# Patient Record
Sex: Male | Born: 1965 | Race: White | Hispanic: No | Marital: Married | Smoking: Former smoker
Health system: Southern US, Community
[De-identification: ages and names within clinical notes are randomized; demographics above are authoritative.]

## PROBLEM LIST (undated history)

## (undated) DIAGNOSIS — I10 Essential (primary) hypertension: Secondary | ICD-10-CM

## (undated) DIAGNOSIS — E7800 Pure hypercholesterolemia, unspecified: Secondary | ICD-10-CM

## (undated) DIAGNOSIS — T7840XA Allergy, unspecified, initial encounter: Secondary | ICD-10-CM

## (undated) HISTORY — DX: Allergy, unspecified, initial encounter: T78.40XA

---

## 2000-03-01 ENCOUNTER — Inpatient Hospital Stay (HOSPITAL_COMMUNITY): Admission: EM | Admit: 2000-03-01 | Discharge: 2000-03-02 | Payer: Self-pay | Admitting: Emergency Medicine

## 2003-05-31 HISTORY — PX: FRACTURE SURGERY: SHX138

## 2003-08-05 ENCOUNTER — Emergency Department (HOSPITAL_COMMUNITY): Admission: EM | Admit: 2003-08-05 | Discharge: 2003-08-06 | Payer: Self-pay | Admitting: Emergency Medicine

## 2003-08-08 ENCOUNTER — Ambulatory Visit (HOSPITAL_BASED_OUTPATIENT_CLINIC_OR_DEPARTMENT_OTHER): Admission: RE | Admit: 2003-08-08 | Discharge: 2003-08-08 | Payer: Self-pay | Admitting: Orthopedic Surgery

## 2013-06-06 ENCOUNTER — Emergency Department (HOSPITAL_COMMUNITY): Payer: Self-pay

## 2013-06-06 ENCOUNTER — Emergency Department (HOSPITAL_COMMUNITY)
Admission: EM | Admit: 2013-06-06 | Discharge: 2013-06-06 | Disposition: A | Discharge status: Home / Self Care | Payer: Self-pay | Attending: Emergency Medicine | Admitting: Emergency Medicine

## 2013-06-06 ENCOUNTER — Encounter (HOSPITAL_COMMUNITY): Payer: Self-pay | Admitting: Emergency Medicine

## 2013-06-06 DIAGNOSIS — Z880 Allergy status to penicillin: Secondary | ICD-10-CM | POA: Insufficient documentation

## 2013-06-06 DIAGNOSIS — K089 Disorder of teeth and supporting structures, unspecified: Secondary | ICD-10-CM | POA: Insufficient documentation

## 2013-06-06 DIAGNOSIS — R0602 Shortness of breath: Secondary | ICD-10-CM | POA: Insufficient documentation

## 2013-06-06 DIAGNOSIS — F1210 Cannabis abuse, uncomplicated: Secondary | ICD-10-CM | POA: Insufficient documentation

## 2013-06-06 DIAGNOSIS — Z87891 Personal history of nicotine dependence: Secondary | ICD-10-CM | POA: Insufficient documentation

## 2013-06-06 DIAGNOSIS — R0789 Other chest pain: Secondary | ICD-10-CM | POA: Insufficient documentation

## 2013-06-06 LAB — POCT I-STAT TROPONIN I
Troponin i, poc: 0 ng/mL (ref 0.00–0.08)
Troponin i, poc: 0 ng/mL (ref 0.00–0.08)

## 2013-06-06 LAB — CBC
HEMATOCRIT: 44.1 % (ref 39.0–52.0)
Hemoglobin: 15.5 g/dL (ref 13.0–17.0)
MCH: 28.7 pg (ref 26.0–34.0)
MCHC: 35.1 g/dL (ref 30.0–36.0)
MCV: 81.5 fL (ref 78.0–100.0)
Platelets: 224 10*3/uL (ref 150–400)
RBC: 5.41 MIL/uL (ref 4.22–5.81)
RDW: 13.1 % (ref 11.5–15.5)
WBC: 11.1 10*3/uL — AB (ref 4.0–10.5)

## 2013-06-06 LAB — COMPREHENSIVE METABOLIC PANEL
ALK PHOS: 94 U/L (ref 39–117)
ALT: 19 U/L (ref 0–53)
AST: 21 U/L (ref 0–37)
Albumin: 4.3 g/dL (ref 3.5–5.2)
BILIRUBIN TOTAL: 0.3 mg/dL (ref 0.3–1.2)
BUN: 17 mg/dL (ref 6–23)
CO2: 23 mEq/L (ref 19–32)
CREATININE: 0.93 mg/dL (ref 0.50–1.35)
Calcium: 9.4 mg/dL (ref 8.4–10.5)
Chloride: 100 mEq/L (ref 96–112)
Glucose, Bld: 169 mg/dL — ABNORMAL HIGH (ref 70–99)
POTASSIUM: 4 meq/L (ref 3.7–5.3)
SODIUM: 139 meq/L (ref 137–147)
TOTAL PROTEIN: 7.8 g/dL (ref 6.0–8.3)

## 2013-06-06 LAB — D-DIMER, QUANTITATIVE (NOT AT ARMC)

## 2013-06-06 MED ORDER — ASPIRIN 81 MG PO CHEW
CHEWABLE_TABLET | ORAL | Status: AC
  Filled 2013-06-06: qty 4

## 2013-06-06 MED ORDER — ASPIRIN 81 MG PO CHEW
324.0000 mg | CHEWABLE_TABLET | Freq: Once | ORAL | Status: AC
  Administered 2013-06-06: 324 mg via ORAL

## 2013-06-06 NOTE — ED Notes (Signed)
[facility] and he was found to have HTN and then he drove back here after finding out it would take a lot to get teeth fixed and he staretd to have cp and sob

## 2013-06-06 NOTE — ED Notes (Signed)
Dr Criss AlvineGoldston is fine with pt final bp of 151/101

## 2013-06-06 NOTE — Discharge Instructions (Signed)
Chest Pain (Nonspecific) °It is often hard to give a specific diagnosis for the cause of chest pain. There is always a chance that your pain could be related to something serious, such as a heart attack or a blood clot in the lungs. You need to follow up with your caregiver for further evaluation. °CAUSES  °· Heartburn. °· Pneumonia or bronchitis. °· Anxiety or stress. °· Inflammation around your heart (pericarditis) or lung (pleuritis or pleurisy). °· A blood clot in the lung. °· A collapsed lung (pneumothorax). It can develop suddenly on its own (spontaneous pneumothorax) or from injury (trauma) to the chest. °· Shingles infection (herpes zoster virus). °The chest wall is composed of bones, muscles, and cartilage. Any of these can be the source of the pain. °· The bones can be bruised by injury. °· The muscles or cartilage can be strained by coughing or overwork. °· The cartilage can be affected by inflammation and become sore (costochondritis). °DIAGNOSIS  °Lab tests or other studies, such as X-rays, electrocardiography, stress testing, or cardiac imaging, may be needed to find the cause of your pain.  °TREATMENT  °· Treatment depends on what may be causing your chest pain. Treatment may include: °· Acid blockers for heartburn. °· Anti-inflammatory medicine. °· Pain medicine for inflammatory conditions. °· Antibiotics if an infection is present. °· You may be advised to change lifestyle habits. This includes stopping smoking and avoiding alcohol, caffeine, and chocolate. °· You may be advised to keep your head raised (elevated) when sleeping. This reduces the chance of acid going backward from your stomach into your esophagus. °· Most of the time, nonspecific chest pain will improve within 2 to 3 days with rest and mild pain medicine. °HOME CARE INSTRUCTIONS  °· If antibiotics were prescribed, take your antibiotics as directed. Finish them even if you start to feel better. °· For the next few days, avoid physical  activities that bring on chest pain. Continue physical activities as directed. °· Do not smoke. °· Avoid drinking alcohol. °· Only take over-the-counter or prescription medicine for pain, discomfort, or fever as directed by your caregiver. °· Follow your caregiver's suggestions for further testing if your chest pain does not go away. °· Keep any follow-up appointments you made. If you do not go to an appointment, you could develop lasting (chronic) problems with pain. If there is any problem keeping an appointment, you must call to reschedule. °SEEK MEDICAL CARE IF:  °· You think you are having problems from the medicine you are taking. Read your medicine instructions carefully. °· Your chest pain does not go away, even after treatment. °· You develop a rash with blisters on your chest. °SEEK IMMEDIATE MEDICAL CARE IF:  °· You have increased chest pain or pain that spreads to your arm, neck, jaw, back, or abdomen. °· You develop shortness of breath, an increasing cough, or you are coughing up blood. °· You have severe back or abdominal pain, feel nauseous, or vomit. °· You develop severe weakness, fainting, or chills. °· You have a fever. °THIS IS AN EMERGENCY. Do not wait to see if the pain will go away. Get medical help at once. Call your local emergency services (911 in [place] Do not drive yourself to the hospital. °MAKE SURE YOU:  °· Understand these instructions. °· Will watch your condition. °· Will get help right away if you are not doing well or get worse. °Document Released: 02/23/2005 Document Revised: 08/08/2011 Document Reviewed: 12/20/2007 °ExitCare® Patient Information ©2014 [employer],   LLC. ° ° ° °Emergency Department Resource Guide °1) Find a Doctor and Pay Out of Pocket °Although you won't have to find out who is covered by your insurance plan, it is a good idea to ask around and get recommendations. You will then need to call the office and see if the doctor you have chosen will accept you as a new  patient and what types of options they offer for patients who are self-pay. Some doctors offer discounts or will set up payment plans for their patients who do not have insurance, but you will need to ask so you aren't surprised when you get to your appointment. ° °2) Contact Your Local Health Department °Not all health departments have doctors that can see patients for sick visits, but many do, so it is worth a call to see if yours does. If you don't know where your local health department is, you can check in your phone book. The CDC also has a tool to help you locate your state's health department, and many state websites also have listings of all of their local health departments. ° °3) Find a Walk-in Clinic °If your illness is not likely to be very severe or complicated, you may want to try a walk in clinic. These are popping up all over the country in pharmacies, drugstores, and shopping centers. They're usually staffed by nurse practitioners or physician assistants that have been trained to treat common illnesses and complaints. They're usually fairly quick and inexpensive. However, if you have serious medical issues or chronic medical problems, these are probably not your best option. ° °No Primary Care Doctor: °- Call [facility] at  832-8000 - they can help you locate a primary care doctor that  accepts your insurance, provides certain services, etc. °- Physician Referral Service- 1-800-533-3463 ° °Chronic Pain Problems: °Organization         Address  Phone   Notes  ° [facility]  (336) 297-2271 Patients need to be referred by their primary care doctor.  ° °Medication Assistance: °Organization         Address  Phone   Notes  °[place] [facility] [address] °[place], [place] [address] (336) 641-8030 --Must be a resident of [place] °-- Must have NO insurance coverage whatsoever (no Medicaid/ Medicare, etc.) °-- The pt. MUST have a primary  care doctor that directs their care regularly and follows them in the community °  °[facility]  (866) 331-1348   °[employer]  (888) 892-1162   ° °Agencies that provide inexpensive medical care: °Organization         Address  Phone   Notes  °[facility]  (336) 832-8035   °[facility]    (336) 832-7272   °[facility] [address] °[place], [place] [address] (336) 832-4777   °[facility] [address], °[place] (336) 271-4999   °[facility]    (336) 373-0678   °[facility]    (336) 272-1050   °[facility] ° [address], [place] Phone:  (336) 832-4444, Fax:  (336) 832-4440 Hours of Operation:  9 am - 6 pm, M-F.  Also accepts Medicaid/Medicare and self-pay.  °[facility] ° [address], [place] Phone: (336) 832-3150, Fax: (336) 832-3151. Hours of Operation:  8:30 am - 5:30 pm, M-F.  Also accepts Medicaid and self-pay.  °  [facility] [place] [address], [place] Phone: (336) 878-6027   °[facility] [address], [place], [place] (336)723-1848, Ext. 123 Mondays & Thursdays: 7-9 AM.  First 15 patients are seen on a first come, first serve basis. °  ° °Medicaid-accepting [place] Providers: ° °Organization         Address  Phone   Notes  °[facility] [address], [place] (336) 641-2100 Also accepts self-pay patients.  °[facility] [address], [place] ° (336) 856-9996   °[facility] [address], [place] (336) 288-8857   °[facility] [address] [place] [address], [place] (336) 299-7000   °Veita Bland [address], [place]  ° (336) 373-1557 Only accepts [place] Access Medicaid patients after they have their name applied to their card.  ° °Self-Pay (no insurance) in [place]: ° °Organization         Address  Phone   Notes  °Sickle Cell Patients, [facility] [address], [place] (336) 832-1970   °[facility] [address], [place] (336) 832-4400   °[facility] ° [address], [place] (336) 992-4800   °[facility]/Dr. Osei-Bonsu ° [address] [place] [address], [place] or [address], [place] (336) 841-8500 Phone number for both [place] and [place] locations is the same.  °[facility] [address], [place] (336) 299-0000   °[facility] [address] [place] [address], [place] or [address] (336) 852-7530 °(336) 878-2260   °[facility] [address], [place] (336) 350-1642, phone; (336) 294-5005, fax Sees patients 1st and 3rd Saturday of every month.  Must not qualify for public or private insurance (i.e. Medicaid, Medicare, [place] Health Choice, Veterans' Benefits) • Household income should be no more than 200% of the poverty level •The clinic cannot treat you if you are pregnant or think you are pregnant • Sexually transmitted diseases are not treated at the clinic.  ° ° °Dental Care: °Organization         Address  Phone  Notes  °[facility] [address],  (336) 641-6152 Accepts children up to age 21 who are enrolled in Medicaid or [place] Health Choice; pregnant women with a Medicaid card; and children who have applied for Medicaid or [place] Health Choice, but were declined, whose parents can pay a reduced fee at time of service.  °[facility] [place]  [address], [place] (336) 641-7733 Accepts children up to age 21 who are enrolled in Medicaid or [place] Health Choice; pregnant women with a Medicaid card; and children who have applied for Medicaid or [place] Health Choice, but were declined, whose parents can  pay a reduced fee at time of service.  °[facility] ° [address],  (336) 641-4533 Patients are seen by appointment only. Walk-ins are not accepted. [facility] will see patients 18 years of age and older. °Monday - Tuesday (8am-5pm) °Most Wednesdays (8:30-5pm) °$30 per visit, cash only  °[facility] ° [address], [place] (336) 641-4533 Patients are seen by appointment only. Walk-ins are not accepted. [facility] will see patients 18 years of   age and older. °One Wednesday Evening (Monthly: Volunteer Based).  $30 per visit, cash only  °[facility]  (919) 537-3737 for adults; Children under age 4, call Graduate Pediatric Dentistry at (919) [phone]. Children aged 4-14, please call (919) [phone] to request a pediatric application. ° Dental services are provided in all areas of dental care including fillings, crowns and bridges, complete and partial dentures, implants, gum treatment, root canals, and extractions. Preventive care is also provided. Treatment is provided to both adults and children. °Patients are selected via a lottery and there is often a waiting list. °  °[facility] [address], °[place] ° (336) 763-8833 [email or web address] °  °[facility] [address], [place], [place] (336)723-1848, Ext. 123 Second and Fourth Thursday of each month, opens at 6:30 AM; Clinic ends at 9 AM.  Patients are seen on a first-come first-served basis, and a limited number are seen during each clinic.  ° °[facility] ° [address], [place], [place] (336) 723-7904   Eligibility Requirements °You must have lived in [place], [place], or [place] counties for at least the last three months. °  You cannot be eligible for state or federal sponsored healthcare insurance, including Veterans Administration, Medicaid, or Medicare. °  You generally cannot be eligible for healthcare  insurance through your employer.  °  How to apply: °Eligibility screenings are held every Tuesday and Wednesday afternoon from 1:00 pm until 4:00 pm. You do not need an appointment for the interview!  °[facility] [address], [place], [place] 336-631-2330   °[facility]  336-342-8273   °[facility]  336-703-3100   °[facility]  336-570-6415   ° °Behavioral Health Resources in the Community: °Intensive Outpatient Programs °Organization         Address  Phone  Notes  °[facility] [address], [place], [place] 336-878-6098   °[facility] [address], [place], [place] 336-832-9800   °[facility] [address], [place], [place] ° 336-882-2125   °[facility] [address],  °[place], [place] 1-800-853-5163 or 336-641-4981   °Substance Abuse Resources °Organization         Address  Phone  Notes  °[facility]  336-882-2125   °[facility]  336-784-9470   °[facility]  336-285-9073   °[facility]  336-845-3988   °Residential & Outpatient Substance Abuse Program  1-800-659-3381   °Psychological Services °Organization         Address  Phone  Notes  °[facility]  336- 832-9600   °[facility]  336- 378-7881   °[facility] [address], [place] 1-800-853-5163 or 336-641-4981   ° °Mobile Crisis Teams °Organization         Address  Phone  Notes  °[facility], Mobile Crisis Care Unit  1-877-626-1772   °Assertive °[facility] ° [address] [place], [place] 336-834-9664   °Sharon DeEsch [address] ° [place] 336-554-5454   ° °Self-Help/Support Groups °Organization         Address  Phone             Notes  °[facility]  - variety of support groups  336- 373-1402 Call for more information  °Narcotics Anonymous (NA),  [facility] [address], °[place]  2   meetings at this location  ° °Residential Treatment Programs °Organization         Address  Phone  Notes  °[facility] [address],    °[place]  1-866-801-8205   °[facility] ° [address], [place], [place] 704-293-8524   °[facility] [address], [place] 336-845-3988 Admissions: 8am-3pm M-F  °[facility] [address]    °[place], [place] 336-841-1104   °[facility] [address], [place], [place] 336-379-7146   °[facility] [address]  °[place], [place] 336-285-9073   °[facility] Intensive Outpatient [address], [place], [place] 336-852-3033   °[facility] [address]  °[place], [place] 1-877-615-2722 or 336-784-9470   °[facility] ([facility] [address] [place], [place] 336-227-7417 Accepts Medicaid  °[facility] [address]  °[place] 1-800-659-3381 Substance Abuse/Addiction Treatment  ° °[place] Behavioral Health Resources °Organization         Address  Phone  Notes  °[facility]  (888) 581-9988   °Julie Brannon, PhD [address] [place], [place]   (336) 349-5553 or (336) 951-0000   °[facility]   [address] °[place], [place] (336) 349-4454   °[facility] [address], [place], [place] (336) 342-8316 Insurance/Medicaid/sponsorship through [facility]  °[facility] [address]                                    [place], [place] (336) 342-8316 Therapy/tele-psych/case  °[facility] [address]  ° [place], [place] (336) 349-2233    °Dr. Arfeen  (336) 349-4544   °[facility] [place]  [employer] [place] [facility] 1) [address], [place] °2) [address], [place] °3)  [address], [place] (336) 349-3220 °(336) 342-7768 ° °(336) 342-8140    °[place] Child Abuse [facility] (336) 342-1394 or (336) 342-3537 (After Hours)    ° ° ° °

## 2013-06-06 NOTE — ED Provider Notes (Signed)
CSN: [id number]     Arrival date & time 06/06/13  1418 History   First MD Initiated Contact with Patient 06/06/13 1736     Chief Complaint  Patient presents with  . Chest Pain   (Consider location/radiation/quality/duration/timing/severity/associated sxs/prior Treatment) HPI Comments: 48 year old male presents with a chief complaint of chest pain. He states he had some intermittent chest tightness and some upper arm aching like he had been working out. He states this happened one is [facility] the way [facility] after they declined to do dental work on him because it got his blood pressure to be 160 systolic. He states he has not had a history of hypertension. He has no primary care provider at this time. He states the chest pain is gone or numbness of his lobe of aching in his arms. He applies pressure to his arm the pain seems improved. He had transient shortness of breath but denies any shortness but this time. No leg pain or leg swelling. He used to smoke but has not smoked for over 10 months. He states he occasionally does some marijuana but denies any other drugs such as cocaine. States father died of a heart attack around his age but had several other comorbidities. The patient states that his pain was partially 5/10 when it was occurring but he has no pain in his chest now.   History reviewed. No pertinent past medical history. No past surgical history on file. No family history on file. History  Substance Use Topics  . Smoking status: Former Games developermoker  . Smokeless tobacco: Not on file  . Alcohol Use: Yes    Review of Systems  Constitutional: Negative for fever, chills and diaphoresis.  HENT: Positive for dental problem.   Respiratory: Positive for shortness of breath.   Cardiovascular: Positive for chest pain. Negative for palpitations and leg swelling.  Gastrointestinal: Negative for nausea, vomiting and abdominal pain.  Musculoskeletal: Negative for back pain.  Neurological:  Negative for weakness.  All other systems reviewed and are negative.    Allergies  Penicillins  Home Medications   Current Outpatient Rx  Name  Route  Sig  Dispense  Refill  . Acetaminophen (TYLENOL PO)   Oral   Take 2 tablets by mouth daily as needed (headache).         . Aspirin-Salicylamide-Caffeine (BC HEADACHE PO)   Oral   Take 1 packet by mouth daily as needed (headache).          BP 150/105  Pulse 81  Temp(Src) 97.4 F (36.3 C)  Resp 16  SpO2 97% Physical Exam  Nursing note and vitals reviewed. Constitutional: He is oriented to person, place, and time. He appears well-developed and well-nourished. No distress.  HENT:  Head: Normocephalic and atraumatic.  Right Ear: External ear normal.  Left Ear: External ear normal.  Nose: Nose normal.  Eyes: Right eye exhibits no discharge. Left eye exhibits no discharge.  Neck: Neck supple.  Cardiovascular: Normal rate, regular rhythm, normal heart sounds and intact distal pulses.   Pulses:      Radial pulses are 2+ on the right side, and 2+ on the left side.  Pulmonary/Chest: Effort normal and breath sounds normal. He exhibits no tenderness.  Abdominal: Soft. He exhibits no distension. There is no tenderness.  Musculoskeletal: He exhibits no edema and no tenderness.  No tenderness to bilateral upper extremities  Neurological: He is alert and oriented to person, place, and time.  Skin: Skin is  warm and dry.    ED Course  Procedures (including critical care time) Labs Review Labs Reviewed  CBC - Abnormal; Notable for the following:    WBC 11.1 (*)    All other components within normal limits  COMPREHENSIVE METABOLIC PANEL - Abnormal; Notable for the following:    Glucose, Bld 169 (*)    All other components within normal limits  D-DIMER, QUANTITATIVE  POCT I-STAT TROPONIN I  POCT I-STAT TROPONIN I   Imaging Review Dg Chest 2 View  06/06/2013   CLINICAL DATA:  Central chest pain  EXAM: CHEST  2 VIEW   COMPARISON:  None.  FINDINGS: The heart size and mediastinal contours are within normal limits. Both lungs are clear. The visualized skeletal structures are unremarkable.  IMPRESSION: No active cardiopulmonary disease.   Electronically Signed   By: Esperanza Heir M.D.   On: 06/06/2013 15:04    EKG Interpretation    Date/Time:  Thursday June 06 2013 14:23:48 EST Ventricular Rate:  106 PR Interval:  134 QRS Duration: 80 QT Interval:  324 QTC Calculation: 430 R Axis:   43 Text Interpretation:  Sinus tachycardia Right atrial enlargement Borderline ECG No old tracing to compare Confirmed by Tava Peery  MD, Marai Teehan ([id number]) on 06/06/2013 5:37:10 PM            MDM   1. Atypical chest pain    Patient is low risk for ACS given his very atypical story and a HEART score of 2. Patient has 2 negative troponins. He is low risk for PE, but cannot be ruled out due to his tachycardia on arrival. D-dimer was evaluated and is negative. I feel her symptoms are more likely related to the anxiety of being told how high his blood pressure was. I discussed options with the patient, which includes needing followup as an outpatient and establish a primary care. Patient agrees to this and will be given resources. I feel he is stable to be discharged. I discussed return precautions.    Audree Camel, MD 06/06/13 [id number]

## 2015-08-11 ENCOUNTER — Encounter: Payer: Self-pay | Admitting: Family Medicine

## 2015-08-11 DIAGNOSIS — T7840XA Allergy, unspecified, initial encounter: Secondary | ICD-10-CM | POA: Insufficient documentation

## 2015-08-21 ENCOUNTER — Encounter: Payer: Self-pay | Admitting: Family Medicine

## 2015-08-21 ENCOUNTER — Ambulatory Visit (INDEPENDENT_AMBULATORY_CARE_PROVIDER_SITE_OTHER): Payer: BLUE CROSS/BLUE SHIELD | Admitting: Family Medicine

## 2015-08-21 VITALS — BP 130/100 | HR 78 | Temp 97.3°F | Resp 16 | Ht 74.0 in | Wt 180.0 lb

## 2015-08-21 DIAGNOSIS — Z7189 Other specified counseling: Secondary | ICD-10-CM | POA: Diagnosis not present

## 2015-08-21 DIAGNOSIS — Z0000 Encounter for general adult medical examination without abnormal findings: Secondary | ICD-10-CM

## 2015-08-21 DIAGNOSIS — I10 Essential (primary) hypertension: Secondary | ICD-10-CM

## 2015-08-21 DIAGNOSIS — Z23 Encounter for immunization: Secondary | ICD-10-CM | POA: Diagnosis not present

## 2015-08-21 DIAGNOSIS — Z7689 Persons encountering health services in other specified circumstances: Secondary | ICD-10-CM

## 2015-08-21 MED ORDER — LOSARTAN POTASSIUM 50 MG PO TABS: 50.0000 mg | ORAL_TABLET | Freq: Every day | ORAL | Status: DC

## 2015-08-21 NOTE — Addendum Note (Signed)
Addended by: Legrand RamsWILLIS, SANDY B on: 08/21/2015 04:11 PM   Modules accepted: Orders

## 2015-08-21 NOTE — Progress Notes (Signed)
Subjective:    Patient ID: Zachary Cuevas, male    DOB: Aug 06, 1965, 50 y.o.   MRN: [id number]  HPI Patient is a 50 year old white male here today to establish care. He does report a history of elevated blood pressures in the past. His father had a quadruple bypass at age 59 and his maternal grandfather had a heart attack in his 30s. He does not smoke. He does occasionally use marijuana. He denies any chest pain shortness of breath or dyspnea on exertion Past Medical History  Diagnosis Date  . Allergy     hay fever   Past Surgical History  Procedure Laterality Date  . Fracture surgery  2005    boxer fracture  of hands   No current outpatient prescriptions on file prior to visit.   No current facility-administered medications on file prior to visit.   Allergies  Allergen Reactions  . Penicillins Other (See Comments)    Childhood allergy   Social History   Social History  . Marital Status: Married    Spouse Name: N/A  . Number of Children: N/A  . Years of Education: N/A   Occupational History  . Not on file.   Social History Main Topics  . Smoking status: Former Smoker    Quit date: 08/11/2011  . Smokeless tobacco: Never Used  . Alcohol Use: No  . Drug Use: No  . Sexual Activity: Yes   Other Topics Concern  . Not on file   Social History Narrative   Family History  Problem Relation Age of Onset  . Miscarriages / [place] Mother   . Heart disease Maternal Grandfather   . Diabetes Paternal Grandfather   . Heart disease Paternal Grandfather   . Heart disease Father 28  . Diabetes Brother    He is married. He has one biologic child and 3 adopted children   Review of Systems  All other systems reviewed and are negative.      Objective:   Physical Exam  Constitutional: He is oriented to person, place, and time. He appears well-developed and well-nourished. No distress.  HENT:  Head: Normocephalic and atraumatic.  Right Ear: External ear normal.    Left Ear: External ear normal.  Nose: Nose normal.  Mouth/Throat: Oropharynx is clear and moist.  Eyes: Conjunctivae and EOM are normal. Pupils are equal, round, and reactive to light. Right eye exhibits no discharge. Left eye exhibits no discharge. No scleral icterus.  Neck: Normal range of motion. Neck supple. No JVD present. No tracheal deviation present. No thyromegaly present.  Cardiovascular: Normal rate, regular rhythm, normal heart sounds and intact distal pulses.  Exam reveals no gallop and no friction rub.   No murmur heard. Pulmonary/Chest: Effort normal and breath sounds normal. No stridor. No respiratory distress. He has no wheezes. He has no rales. He exhibits no tenderness.  Abdominal: Soft. Bowel sounds are normal. He exhibits no distension and no mass. There is no tenderness. There is no rebound and no guarding.  Musculoskeletal: Normal range of motion. He exhibits no edema or tenderness.  Lymphadenopathy:    He has no cervical adenopathy.  Neurological: He is alert and oriented to person, place, and time. He has normal reflexes. He displays normal reflexes. No cranial nerve deficit. He exhibits normal muscle tone. Coordination normal.  Skin: Skin is warm. No rash noted. He is not diaphoretic. No erythema. No pallor.  Psychiatric: He has a normal mood and affect. His behavior is normal. Judgment and thought content  normal.  Vitals reviewed.         Assessment & Plan:  Benign essential HTN - Plan: losartan (COZAAR) 50 MG tablet  Routine general medical examination at a health care facility  Establishing care with new doctor, encounter for  Begin losartan 50 mg by mouth daily and recheck blood pressure in one month. Return fasting for a CBC, CMP, fasting lipid panel. He is not due for colonoscopy or PSA until age 50. He received his tetanus shot today.

## 2015-10-12 ENCOUNTER — Ambulatory Visit (INDEPENDENT_AMBULATORY_CARE_PROVIDER_SITE_OTHER): Payer: BLUE CROSS/BLUE SHIELD | Admitting: Physician Assistant

## 2015-10-12 ENCOUNTER — Encounter: Payer: Self-pay | Admitting: Physician Assistant

## 2015-10-12 VITALS — BP 152/100 | HR 80 | Temp 98.0°F | Resp 18 | Wt 178.0 lb

## 2015-10-12 DIAGNOSIS — K029 Dental caries, unspecified: Secondary | ICD-10-CM

## 2015-10-12 MED ORDER — CLINDAMYCIN HCL 300 MG PO CAPS: 300.0000 mg | ORAL_CAPSULE | Freq: Three times a day (TID) | ORAL | Status: DC

## 2015-10-12 MED ORDER — HYDROCODONE-ACETAMINOPHEN 5-325 MG PO TABS: 1.0000 | ORAL_TABLET | Freq: Four times a day (QID) | ORAL | Status: DC | PRN

## 2015-10-12 NOTE — Progress Notes (Signed)
Patient ID: Zachary Cuevas MRN: [id number], DOB: 04-12-1966, 50 y.o. Date of Encounter: 10/12/2015, 4:38 PM    Chief Complaint:  Chief Complaint  Patient presents with  . c/o tooth ache now whole side of face hurts     HPI: 50 y.o. year old white male presents with above. Says he has false teeth for upper left. Says this is a bad tooth on left bottom causing current pain. Says he knows he needs to have teeth pulled but doesn't have the money. Says he will have some money coming in at the end of the month and hopes he can start getting teeth taken care of then.  Discussed using Tylenol 3 during day so that not on strong med during work. He says they "are down a car right now" so wife drives him to work and picks him up. Says he is not working with any equipment, not on any ladders etc. Says he knows otc pain meds "not touching it any more. " Says entire left lower jaw hurting.     Home Meds:   Outpatient Prescriptions Prior to Visit  Medication Sig Dispense Refill  . losartan (COZAAR) 50 MG tablet Take 1 tablet (50 mg total) by mouth daily. 90 tablet 3   No facility-administered medications prior to visit.    Allergies:  Allergies  Allergen Reactions  . Penicillins Other (See Comments)    Childhood allergy      Review of Systems: See HPI for pertinent ROS. All other ROS negative.    Physical Exam: Blood pressure 152/100, pulse 80, temperature 98 F (36.7 C), temperature source Oral, resp. rate 18, weight 178 lb (80.74 kg)., Body mass index is 22.84 kg/(m^2). General:  WNWD WM. Appears in no acute distress. HEENT: Normocephalic, atraumatic. Bilateral auditory canals clear, TM's are without perforation, pearly grey and translucent with reflective cone of light bilaterally. Oral cavity moist. Left lower teeth---all with severe dental caries. All teeth with black holes and brown teeth.   Neck: Supple. No thyromegaly. 50 reports tenderness with palpation of left tonsillar node.  No enlargement with palpation.  Lungs: Clear bilaterally to auscultation without wheezes, rales, or rhonchi. Breathing is unlabored. Heart: Regular rhythm. No murmurs, rubs, or gallops. Msk:  Strength and tone normal for age. Extremities/Skin: Warm and dry. Neuro: Alert and oriented X 3. Moves all extremities spontaneously. Gait is normal. CNII-XII grossly in tact. Psych:  Responds to questions appropriately with a normal affect.     ASSESSMENT AND PLAN:  50 y.o. year old male with  1. 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 He is to take Clindamycin as directed. Use pain med as directed, as sparingly as possible. Discussed that this is just a "band-aid" and is not fixing underlying problem. He voices understanding and agrees to schedule f/u as soon as he has some money.   Says he will have some money coming in at the end of the month and hopes he can start getting teeth taken care of then.  Discussed using Tylenol 3 during day so that not on strong med during work. He says they "are down a car right now" so wife drives him to work and picks him up. Says he is not working with any equipment, not on any ladders etc. Says he knows otc pain meds "not touching it any more. " Says entire left lower jaw hurting. I reviewed his chart. See no indication of narcotic abuse in past.   - clindamycin (CLEOCIN) 300 MG capsule; Take  1 capsule (300 mg total) by mouth 3 (three) times daily.  Dispense: 30 capsule; Refill: 0 - HYDROcodone-acetaminophen (NORCO/VICODIN) 5-325 MG tablet; Take 1 tablet by mouth every 6 (six) hours as needed.  Dispense: 45 tablet; Refill: 0   Signed, [address] Phil[address] StreetMary Beth Blue RidgeDixon, [place]PA, [facility] 10/12/2015 4:38 PM

## 2015-11-20 ENCOUNTER — Other Ambulatory Visit: Payer: BLUE CROSS/BLUE SHIELD

## 2015-11-20 DIAGNOSIS — I10 Essential (primary) hypertension: Secondary | ICD-10-CM | POA: Diagnosis not present

## 2015-11-20 LAB — LIPID PANEL
CHOL/HDL RATIO: 7.3 ratio — AB (ref <=5.0)
CHOLESTEROL: 218 mg/dL — AB (ref 125–200)
HDL: 30 mg/dL — ABNORMAL LOW (ref >=40)
LDL CALC: 131 mg/dL — AB (ref <130)
Triglycerides: 287 mg/dL — ABNORMAL HIGH (ref <150)
VLDL: 57 mg/dL — AB (ref <30)

## 2015-11-20 LAB — COMPLETE METABOLIC PANEL WITH GFR
ALT: 18 U/L (ref 9–46)
AST: 21 U/L (ref 10–40)
Albumin: 4.2 g/dL (ref 3.6–5.1)
Alkaline Phosphatase: 81 U/L (ref 40–115)
BUN: 11 mg/dL (ref 7–25)
CHLORIDE: 106 mmol/L (ref 98–110)
CO2: 24 mmol/L (ref 20–31)
CREATININE: 0.92 mg/dL (ref 0.60–1.35)
Calcium: 9.1 mg/dL (ref 8.6–10.3)
GFR, Est African American: >89 mL/min (ref >=60)
GFR, Est Non African American: >89 mL/min (ref >=60)
GLUCOSE: 105 mg/dL — AB (ref 70–99)
POTASSIUM: 4.7 mmol/L (ref 3.5–5.3)
SODIUM: 140 mmol/L (ref 135–146)
Total Bilirubin: 0.4 mg/dL (ref 0.2–1.2)
Total Protein: 6.7 g/dL (ref 6.1–8.1)

## 2015-11-20 LAB — CBC WITH DIFFERENTIAL/PLATELET
BASOS PCT: 1 %
Basophils Absolute: 92 cells/uL (ref 0–200)
EOS PCT: 6 %
Eosinophils Absolute: 552 cells/uL — ABNORMAL HIGH (ref 15–500)
HEMATOCRIT: 43.9 % (ref 38.5–50.0)
Hemoglobin: 14.6 g/dL (ref 13.0–17.0)
LYMPHS PCT: 33 %
Lymphs Abs: 3036 cells/uL (ref 850–3900)
MCH: 27.3 pg (ref 27.0–33.0)
MCHC: 33.3 g/dL (ref 32.0–36.0)
MCV: 82.2 fL (ref 80.0–100.0)
MONO ABS: 368 {cells}/uL (ref 200–950)
MONOS PCT: 4 %
MPV: 10.6 fL (ref 7.5–12.5)
NEUTROS PCT: 56 %
Neutro Abs: 5152 cells/uL (ref 1500–7800)
PLATELETS: 217 10*3/uL (ref 140–400)
RBC: 5.34 MIL/uL (ref 4.20–5.80)
RDW: 14.7 % (ref 11.0–15.0)
WBC: 9.2 10*3/uL (ref 3.8–10.8)

## 2015-11-24 ENCOUNTER — Other Ambulatory Visit: Payer: Self-pay | Admitting: Family Medicine

## 2015-11-24 DIAGNOSIS — E785 Hyperlipidemia, unspecified: Secondary | ICD-10-CM

## 2015-11-24 DIAGNOSIS — Z79899 Other long term (current) drug therapy: Secondary | ICD-10-CM

## 2015-11-24 DIAGNOSIS — I10 Essential (primary) hypertension: Secondary | ICD-10-CM

## 2015-11-24 MED ORDER — ATORVASTATIN CALCIUM 20 MG PO TABS: 20.0000 mg | ORAL_TABLET | Freq: Every day | ORAL | Status: DC

## 2015-12-22 ENCOUNTER — Other Ambulatory Visit: Payer: Self-pay | Admitting: Family Medicine

## 2015-12-22 MED ORDER — ATORVASTATIN CALCIUM 20 MG PO TABS: 20.0000 mg | ORAL_TABLET | Freq: Every day | ORAL | 3 refills | Status: DC

## 2016-03-25 ENCOUNTER — Ambulatory Visit (INDEPENDENT_AMBULATORY_CARE_PROVIDER_SITE_OTHER): Payer: BLUE CROSS/BLUE SHIELD | Admitting: Family Medicine

## 2016-03-25 ENCOUNTER — Encounter: Payer: Self-pay | Admitting: Family Medicine

## 2016-03-25 VITALS — BP 148/100 | HR 80 | Temp 97.4°F | Wt 177.0 lb

## 2016-03-25 DIAGNOSIS — R1011 Right upper quadrant pain: Secondary | ICD-10-CM | POA: Diagnosis not present

## 2016-03-25 MED ORDER — LOSARTAN POTASSIUM-HCTZ 100-12.5 MG PO TABS: 1.0000 | ORAL_TABLET | Freq: Every day | ORAL | 3 refills | Status: DC

## 2016-03-25 NOTE — Progress Notes (Signed)
Subjective:    Patient ID: Zachary Cuevas, male    DOB: 12-01-1965, 50 y.o.   MRN: [id number]  HPI  08/21/15 Patient is a 50 year old white male here today to establish care. He does report a history of elevated blood pressures in the past. His father had a quadruple bypass at age 49 and his maternal grandfather had a heart attack in his 30s. He does not smoke. He does occasionally use marijuana. He denies any chest pain shortness of breath or dyspnea on exertion.  At that time, my plan was: Begin losartan 50 mg by mouth daily and recheck blood pressure in one month. Return fasting for a CBC, CMP, fasting lipid panel. He is not due for colonoscopy or PSA until age 50. He received his tetanus shot today.  03/25/16 The patient states that roughly around the same time he started the cholesterol medication he developed a vague pain in his epigastric area/right upper quadrant. He states that it does not occur every day. It is not sharp. It is difficult to describe however is similar to a dull cramp. It can last for a long period of time. He denies any blood in his stool. He denies any black tarry stools. He denies any fevers or chills. He denies any nausea vomiting or diarrhea. He denies any constipation. He does not drink. There is no obvious jaundice or scleral icterus. There is no tenderness to palpation in the right upper quadrant. There is no guarding or rebound. He denies any exacerbation with food. There are no exacerbating or relieving factors. His blood pressure today is still elevated even on the losartan Past Medical History:  Diagnosis Date  . Allergy    hay fever   Past Surgical History:  Procedure Laterality Date  . FRACTURE SURGERY  2005   boxer fracture  of hands   Current Outpatient Prescriptions on File Prior to Visit  Medication Sig Dispense Refill  . atorvastatin (LIPITOR) 20 MG tablet Take 1 tablet (20 mg total) by mouth daily. 90 tablet 3  . clindamycin (CLEOCIN) 300 MG  capsule Take 1 capsule (300 mg total) by mouth 3 (three) times daily. 30 capsule 0  . HYDROcodone-acetaminophen (NORCO/VICODIN) 5-325 MG tablet Take 1 tablet by mouth every 6 (six) hours as needed. 45 tablet 0  . losartan (COZAAR) 50 MG tablet Take 1 tablet (50 mg total) by mouth daily. 90 tablet 3   No current facility-administered medications on file prior to visit.    Allergies  Allergen Reactions  . Penicillins Other (See Comments)    Childhood allergy   Social History   Social History  . Marital status: Married    Spouse name: N/A  . Number of children: N/A  . Years of education: N/A   Occupational History  . Not on file.   Social History Main Topics  . Smoking status: Former Smoker    Quit date: 08/11/2011  . Smokeless tobacco: Never Used  . Alcohol use No  . Drug use: No  . Sexual activity: Yes   Other Topics Concern  . Not on file   Social History Narrative  . No narrative on file   Family History  Problem Relation Age of Onset  . Miscarriages / [place] Mother   . Heart disease Maternal Grandfather   . Diabetes Paternal Grandfather   . Heart disease Paternal Grandfather   . Heart disease Father [phone]  . Diabetes Brother    He is married. He has one biologic child  and 3 adopted children   Review of Systems  All other systems reviewed and are negative.      Objective:   Physical Exam  Constitutional: He is oriented to person, place, and time. He appears well-developed and well-nourished. No distress.  HENT:  Head: Normocephalic and atraumatic.  Right Ear: External ear normal.  Left Ear: External ear normal.  Nose: Nose normal.  Mouth/Throat: Oropharynx is clear and moist.  Eyes: Conjunctivae and EOM are normal. Pupils are equal, round, and reactive to light. Right eye exhibits no discharge. Left eye exhibits no discharge. No scleral icterus.  Neck: Normal range of motion. Neck supple. No JVD present. No tracheal deviation present. No thyromegaly  present.  Cardiovascular: Normal rate, regular rhythm, normal heart sounds and intact distal pulses.  Exam reveals no gallop and no friction rub.   No murmur heard. Pulmonary/Chest: Effort normal and breath sounds normal. No stridor. No respiratory distress. He has no wheezes. He has no rales. He exhibits no tenderness.  Abdominal: Soft. Bowel sounds are normal. He exhibits no distension and no mass. There is no tenderness. There is no rebound and no guarding.  Musculoskeletal: Normal range of motion. He exhibits no edema or tenderness.  Lymphadenopathy:    He has no cervical adenopathy.  Neurological: He is alert and oriented to person, place, and time. He has normal reflexes. No cranial nerve deficit. He exhibits normal muscle tone. Coordination normal.  Skin: Skin is warm. No rash noted. He is not diaphoretic. No erythema. No pallor.  Psychiatric: He has a normal mood and affect. His behavior is normal. Judgment and thought content normal.  Vitals reviewed.         Assessment & Plan:  RUQ abdominal pain - Plan: CBC with Differential/Platelet, COMPLETE METABOLIC PANEL WITH GFR, Lipase  Blood pressure still elevated. I will discontinue losartan and switch the patient to Hyzaar 100/12.5 one by mouth daily. Meanwhile I will check a CBC, CMP, and a lipase. I will have the patient temporarily discontinue Lipitor and also begin Nexium roughly 40 mg a day and recheck in one week. If pain persists, proceed with imaging of the abdomen and pelvis.  Suspect this may be dyspepsia or possibly an early duodenal ulcer

## 2016-03-26 LAB — CBC WITH DIFFERENTIAL/PLATELET
BASOS ABS: 93 {cells}/uL (ref 0–200)
BASOS PCT: 1 %
EOS ABS: 465 {cells}/uL (ref 15–500)
Eosinophils Relative: 5 %
HEMATOCRIT: 41.4 % (ref 38.5–50.0)
Hemoglobin: 14 g/dL (ref 13.0–17.0)
Lymphocytes Relative: 33 %
Lymphs Abs: 3069 cells/uL (ref 850–3900)
MCH: 28 pg (ref 27.0–33.0)
MCHC: 33.8 g/dL (ref 32.0–36.0)
MCV: 82.8 fL (ref 80.0–100.0)
MONO ABS: 465 {cells}/uL (ref 200–950)
MONOS PCT: 5 %
MPV: 10.8 fL (ref 7.5–12.5)
NEUTROS ABS: 5208 {cells}/uL (ref 1500–7800)
Neutrophils Relative %: 56 %
Platelets: 224 10*3/uL (ref 140–400)
RBC: 5 MIL/uL (ref 4.20–5.80)
RDW: 14.1 % (ref 11.0–15.0)
WBC: 9.3 10*3/uL (ref 3.8–10.8)

## 2016-03-26 LAB — COMPLETE METABOLIC PANEL WITH GFR
ALBUMIN: 4.4 g/dL (ref 3.6–5.1)
ALT: 18 U/L (ref 9–46)
AST: 22 U/L (ref 10–40)
Alkaline Phosphatase: 78 U/L (ref 40–115)
BILIRUBIN TOTAL: 0.4 mg/dL (ref 0.2–1.2)
BUN: 16 mg/dL (ref 7–25)
CALCIUM: 9.5 mg/dL (ref 8.6–10.3)
CHLORIDE: 107 mmol/L (ref 98–110)
CO2: 26 mmol/L (ref 20–31)
CREATININE: 0.99 mg/dL (ref 0.60–1.35)
GFR, Est Non African American: >89 mL/min (ref >=60)
Glucose, Bld: 103 mg/dL — ABNORMAL HIGH (ref 70–99)
Potassium: 4.2 mmol/L (ref 3.5–5.3)
Sodium: 141 mmol/L (ref 135–146)
TOTAL PROTEIN: 6.9 g/dL (ref 6.1–8.1)

## 2016-03-26 LAB — LIPASE: LIPASE: 95 U/L — AB (ref 7–60)

## 2016-04-11 ENCOUNTER — Other Ambulatory Visit: Payer: Self-pay | Admitting: Family Medicine

## 2016-04-11 MED ORDER — ESOMEPRAZOLE MAGNESIUM 40 MG PO CPDR: 40.0000 mg | DELAYED_RELEASE_CAPSULE | Freq: Every day | ORAL | 5 refills | Status: DC

## 2016-07-13 ENCOUNTER — Encounter (HOSPITAL_COMMUNITY)
Admission: EM | Disposition: A | Discharge status: Home / Self Care | Payer: Self-pay | Source: Home / Self Care | Attending: Thoracic Surgery (Cardiothoracic Vascular Surgery)

## 2016-07-13 ENCOUNTER — Inpatient Hospital Stay (HOSPITAL_COMMUNITY): Payer: BLUE CROSS/BLUE SHIELD

## 2016-07-13 ENCOUNTER — Inpatient Hospital Stay (HOSPITAL_COMMUNITY)
Admission: EM | Admit: 2016-07-13 | Discharge: 2016-07-18 | DRG: 234 | Disposition: A | Discharge status: Home / Self Care | Payer: BLUE CROSS/BLUE SHIELD | Attending: Thoracic Surgery (Cardiothoracic Vascular Surgery) | Admitting: Thoracic Surgery (Cardiothoracic Vascular Surgery)

## 2016-07-13 ENCOUNTER — Encounter (HOSPITAL_COMMUNITY): Payer: Self-pay | Admitting: Emergency Medicine

## 2016-07-13 ENCOUNTER — Emergency Department (HOSPITAL_COMMUNITY): Payer: BLUE CROSS/BLUE SHIELD

## 2016-07-13 DIAGNOSIS — Z79899 Other long term (current) drug therapy: Secondary | ICD-10-CM | POA: Diagnosis not present

## 2016-07-13 DIAGNOSIS — E7800 Pure hypercholesterolemia, unspecified: Secondary | ICD-10-CM | POA: Diagnosis not present

## 2016-07-13 DIAGNOSIS — I214 Non-ST elevation (NSTEMI) myocardial infarction: Principal | ICD-10-CM | POA: Diagnosis present

## 2016-07-13 DIAGNOSIS — Z01810 Encounter for preprocedural cardiovascular examination: Secondary | ICD-10-CM | POA: Diagnosis not present

## 2016-07-13 DIAGNOSIS — F1290 Cannabis use, unspecified, uncomplicated: Secondary | ICD-10-CM | POA: Diagnosis present

## 2016-07-13 DIAGNOSIS — R000 Tachycardia, unspecified: Secondary | ICD-10-CM | POA: Diagnosis not present

## 2016-07-13 DIAGNOSIS — E785 Hyperlipidemia, unspecified: Secondary | ICD-10-CM | POA: Diagnosis present

## 2016-07-13 DIAGNOSIS — I10 Essential (primary) hypertension: Secondary | ICD-10-CM | POA: Diagnosis not present

## 2016-07-13 DIAGNOSIS — K219 Gastro-esophageal reflux disease without esophagitis: Secondary | ICD-10-CM | POA: Diagnosis present

## 2016-07-13 DIAGNOSIS — I2510 Atherosclerotic heart disease of native coronary artery without angina pectoris: Secondary | ICD-10-CM | POA: Diagnosis present

## 2016-07-13 DIAGNOSIS — I25110 Atherosclerotic heart disease of native coronary artery with unstable angina pectoris: Secondary | ICD-10-CM | POA: Diagnosis not present

## 2016-07-13 DIAGNOSIS — Z951 Presence of aortocoronary bypass graft: Secondary | ICD-10-CM

## 2016-07-13 DIAGNOSIS — I081 Rheumatic disorders of both mitral and tricuspid valves: Secondary | ICD-10-CM | POA: Diagnosis not present

## 2016-07-13 DIAGNOSIS — I252 Old myocardial infarction: Secondary | ICD-10-CM

## 2016-07-13 DIAGNOSIS — E8770 Fluid overload, unspecified: Secondary | ICD-10-CM | POA: Diagnosis not present

## 2016-07-13 DIAGNOSIS — R079 Chest pain, unspecified: Secondary | ICD-10-CM

## 2016-07-13 DIAGNOSIS — Z8249 Family history of ischemic heart disease and other diseases of the circulatory system: Secondary | ICD-10-CM

## 2016-07-13 DIAGNOSIS — J9811 Atelectasis: Secondary | ICD-10-CM | POA: Diagnosis not present

## 2016-07-13 DIAGNOSIS — Z87891 Personal history of nicotine dependence: Secondary | ICD-10-CM | POA: Diagnosis not present

## 2016-07-13 HISTORY — PX: LEFT HEART CATH AND CORONARY ANGIOGRAPHY: CATH118249

## 2016-07-13 HISTORY — DX: Pure hypercholesterolemia, unspecified: E78.00

## 2016-07-13 HISTORY — DX: Essential (primary) hypertension: I10

## 2016-07-13 HISTORY — DX: Non-ST elevation (NSTEMI) myocardial infarction: I21.4

## 2016-07-13 HISTORY — PX: CORONARY BALLOON ANGIOPLASTY: CATH118233

## 2016-07-13 LAB — CBC
HCT: 44 % (ref 39.0–52.0)
Hemoglobin: 14.8 g/dL (ref 13.0–17.0)
MCH: 28 pg (ref 26.0–34.0)
MCHC: 33.6 g/dL (ref 30.0–36.0)
MCV: 83.2 fL (ref 78.0–100.0)
PLATELETS: 227 10*3/uL (ref 150–400)
RBC: 5.29 MIL/uL (ref 4.22–5.81)
RDW: 13.2 % (ref 11.5–15.5)
WBC: 11.9 10*3/uL — AB (ref 4.0–10.5)

## 2016-07-13 LAB — MRSA PCR SCREENING: MRSA by PCR: NEGATIVE

## 2016-07-13 LAB — I-STAT TROPONIN, ED
TROPONIN I, POC: 0.02 ng/mL (ref 0.00–0.08)
TROPONIN I, POC: 2.04 ng/mL — AB (ref 0.00–0.08)

## 2016-07-13 LAB — ABO/RH: ABO/RH(D): O NEG

## 2016-07-13 LAB — BASIC METABOLIC PANEL
Anion gap: 11 (ref 5–15)
BUN: 20 mg/dL (ref 6–20)
CALCIUM: 10 mg/dL (ref 8.9–10.3)
CO2: 29 mmol/L (ref 22–32)
CREATININE: 1.16 mg/dL (ref 0.61–1.24)
Chloride: 101 mmol/L (ref 101–111)
GFR calc non Af Amer: >60 mL/min (ref >60)
Glucose, Bld: 173 mg/dL — ABNORMAL HIGH (ref 65–99)
Potassium: 3.6 mmol/L (ref 3.5–5.1)
SODIUM: 141 mmol/L (ref 135–145)

## 2016-07-13 LAB — POCT ACTIVATED CLOTTING TIME: ACTIVATED CLOTTING TIME: 268 s

## 2016-07-13 LAB — PROTIME-INR
INR: 0.96
PROTHROMBIN TIME: 12.8 s (ref 11.4–15.2)

## 2016-07-13 LAB — POCT I-STAT 3, ART BLOOD GAS (G3+)
Bicarbonate: 25.1 mmol/L (ref 20.0–28.0)
O2 SAT: 97 %
PCO2 ART: 40.4 mmHg (ref 32.0–48.0)
TCO2: 26 mmol/L (ref 0–100)
pH, Arterial: 7.4 (ref 7.350–7.450)
pO2, Arterial: 89 mmHg (ref 83.0–108.0)

## 2016-07-13 LAB — TYPE AND SCREEN
ABO/RH(D): O NEG
Antibody Screen: NEGATIVE

## 2016-07-13 LAB — TROPONIN I
TROPONIN I: 11.68 ng/mL — AB (ref <0.03)
Troponin I: 3.05 ng/mL (ref <0.03)

## 2016-07-13 SURGERY — LEFT HEART CATH AND CORONARY ANGIOGRAPHY
Anesthesia: LOCAL

## 2016-07-13 MED ORDER — LOSARTAN POTASSIUM-HCTZ 100-12.5 MG PO TABS: 1.0000 | ORAL_TABLET | Freq: Every day | ORAL | Status: DC

## 2016-07-13 MED ORDER — MIDAZOLAM HCL 2 MG/2ML IJ SOLN
INTRAMUSCULAR | Status: DC | PRN
  Administered 2016-07-13: 1 mg via INTRAVENOUS

## 2016-07-13 MED ORDER — DIAZEPAM 5 MG PO TABS
5.0000 mg | ORAL_TABLET | Freq: Once | ORAL | Status: AC
  Administered 2016-07-14: 5 mg via ORAL
  Filled 2016-07-13: qty 1

## 2016-07-13 MED ORDER — TRANEXAMIC ACID 1000 MG/10ML IV SOLN
1.5000 mg/kg/h | INTRAVENOUS | Status: DC
  Filled 2016-07-13: qty 25

## 2016-07-13 MED ORDER — SODIUM CHLORIDE 0.9 % IV SOLN: 250.0000 mL | INTRAVENOUS | Status: DC | PRN

## 2016-07-13 MED ORDER — HEPARIN BOLUS VIA INFUSION
4000.0000 [IU] | Freq: Once | INTRAVENOUS | Status: AC
  Administered 2016-07-13: 4000 [IU] via INTRAVENOUS
  Filled 2016-07-13: qty 4000

## 2016-07-13 MED ORDER — SODIUM CHLORIDE 0.9% FLUSH: 3.0000 mL | Freq: Two times a day (BID) | INTRAVENOUS | Status: DC

## 2016-07-13 MED ORDER — HEPARIN (PORCINE) IN NACL 100-0.45 UNIT/ML-% IJ SOLN
1200.0000 [IU]/h | INTRAMUSCULAR | Status: DC
  Administered 2016-07-13: 1200 [IU]/h via INTRAVENOUS
  Filled 2016-07-13: qty 250

## 2016-07-13 MED ORDER — ONDANSETRON HCL 4 MG/2ML IJ SOLN: 4.0000 mg | Freq: Four times a day (QID) | INTRAMUSCULAR | Status: DC | PRN

## 2016-07-13 MED ORDER — SODIUM CHLORIDE 0.9 % WEIGHT BASED INFUSION: 1.0000 mL/kg/h | INTRAVENOUS | Status: DC

## 2016-07-13 MED ORDER — PANTOPRAZOLE SODIUM 40 MG PO TBEC
40.0000 mg | DELAYED_RELEASE_TABLET | Freq: Every day | ORAL | Status: DC
Start: 2016-07-13 — End: 2016-07-14
  Administered 2016-07-13: 40 mg via ORAL
  Filled 2016-07-13: qty 1

## 2016-07-13 MED ORDER — SODIUM CHLORIDE 0.9 % WEIGHT BASED INFUSION
3.0000 mL/kg/h | INTRAVENOUS | Status: DC
  Administered 2016-07-13: 3 mL/kg/h via INTRAVENOUS

## 2016-07-13 MED ORDER — PHENYLEPHRINE HCL 10 MG/ML IJ SOLN
30.0000 ug/min | INTRAMUSCULAR | Status: DC
  Filled 2016-07-13: qty 2

## 2016-07-13 MED ORDER — HEPARIN SODIUM (PORCINE) 1000 UNIT/ML IJ SOLN
INTRAMUSCULAR | Status: AC
  Filled 2016-07-13: qty 1

## 2016-07-13 MED ORDER — CHLORHEXIDINE GLUCONATE 0.12 % MT SOLN
15.0000 mL | Freq: Once | OROMUCOSAL | Status: AC
  Administered 2016-07-14: 15 mL via OROMUCOSAL
  Filled 2016-07-13: qty 15

## 2016-07-13 MED ORDER — LOSARTAN POTASSIUM 50 MG PO TABS
100.0000 mg | ORAL_TABLET | Freq: Every day | ORAL | Status: DC
  Administered 2016-07-13: 100 mg via ORAL
  Filled 2016-07-13: qty 2

## 2016-07-13 MED ORDER — ACETAMINOPHEN 325 MG PO TABS
650.0000 mg | ORAL_TABLET | ORAL | Status: DC | PRN
  Administered 2016-07-13: 650 mg via ORAL
  Filled 2016-07-13: qty 2

## 2016-07-13 MED ORDER — ASPIRIN EC 81 MG PO TBEC: 81.0000 mg | DELAYED_RELEASE_TABLET | Freq: Every day | ORAL | Status: DC

## 2016-07-13 MED ORDER — SODIUM CHLORIDE 0.9 % IV SOLN
INTRAVENOUS | Status: DC
  Filled 2016-07-13: qty 30

## 2016-07-13 MED ORDER — ALPRAZOLAM 0.25 MG PO TABS: 0.2500 mg | ORAL_TABLET | ORAL | Status: DC | PRN

## 2016-07-13 MED ORDER — IOPAMIDOL (ISOVUE-370) INJECTION 76%
INTRAVENOUS | Status: AC
  Filled 2016-07-13: qty 50

## 2016-07-13 MED ORDER — HYDROCHLOROTHIAZIDE 12.5 MG PO CAPS
12.5000 mg | ORAL_CAPSULE | Freq: Every day | ORAL | Status: DC
  Administered 2016-07-13: 12.5 mg via ORAL
  Filled 2016-07-13: qty 1

## 2016-07-13 MED ORDER — ASPIRIN 81 MG PO CHEW: 81.0000 mg | CHEWABLE_TABLET | ORAL | Status: DC

## 2016-07-13 MED ORDER — BISACODYL 5 MG PO TBEC
5.0000 mg | DELAYED_RELEASE_TABLET | Freq: Once | ORAL | Status: AC
  Administered 2016-07-13: 5 mg via ORAL
  Filled 2016-07-13: qty 1

## 2016-07-13 MED ORDER — ATORVASTATIN CALCIUM 80 MG PO TABS
80.0000 mg | ORAL_TABLET | Freq: Every day | ORAL | Status: DC
  Administered 2016-07-13: 80 mg via ORAL
  Filled 2016-07-13: qty 1

## 2016-07-13 MED ORDER — POTASSIUM CHLORIDE 2 MEQ/ML IV SOLN
80.0000 meq | INTRAVENOUS | Status: DC
  Filled 2016-07-13: qty 40

## 2016-07-13 MED ORDER — LEVOFLOXACIN IN D5W 500 MG/100ML IV SOLN
500.0000 mg | INTRAVENOUS | Status: DC
  Filled 2016-07-13: qty 100

## 2016-07-13 MED ORDER — ATORVASTATIN CALCIUM 80 MG PO TABS: 80.0000 mg | ORAL_TABLET | Freq: Every day | ORAL | Status: DC

## 2016-07-13 MED ORDER — NITROGLYCERIN IN D5W 200-5 MCG/ML-% IV SOLN
2.0000 ug/min | INTRAVENOUS | Status: DC
  Filled 2016-07-13: qty 250

## 2016-07-13 MED ORDER — SODIUM CHLORIDE 0.9% FLUSH: 3.0000 mL | INTRAVENOUS | Status: DC | PRN

## 2016-07-13 MED ORDER — ATORVASTATIN CALCIUM 80 MG PO TABS: 80.0000 mg | ORAL_TABLET | ORAL | Status: DC

## 2016-07-13 MED ORDER — TIROFIBAN HCL IN NACL 5-0.9 MG/100ML-% IV SOLN
INTRAVENOUS | Status: AC
  Filled 2016-07-13: qty 100

## 2016-07-13 MED ORDER — NITROGLYCERIN 0.4 MG SL SUBL
0.4000 mg | SUBLINGUAL_TABLET | SUBLINGUAL | Status: DC | PRN
  Administered 2016-07-13: 0.4 mg via SUBLINGUAL
  Filled 2016-07-13: qty 1

## 2016-07-13 MED ORDER — MORPHINE SULFATE (PF) 4 MG/ML IV SOLN
4.0000 mg | Freq: Once | INTRAVENOUS | Status: AC
  Administered 2016-07-13: 4 mg via INTRAVENOUS
  Filled 2016-07-13: qty 1

## 2016-07-13 MED ORDER — MIDAZOLAM HCL 2 MG/2ML IJ SOLN
INTRAMUSCULAR | Status: AC
  Filled 2016-07-13: qty 2

## 2016-07-13 MED ORDER — EPINEPHRINE PF 1 MG/ML IJ SOLN
0.0000 ug/min | INTRAVENOUS | Status: DC
  Filled 2016-07-13: qty 4

## 2016-07-13 MED ORDER — LIDOCAINE HCL (PF) 1 % IJ SOLN
INTRAMUSCULAR | Status: DC | PRN
  Administered 2016-07-13: 3 mL

## 2016-07-13 MED ORDER — DOPAMINE-DEXTROSE 3.2-5 MG/ML-% IV SOLN
0.0000 ug/kg/min | INTRAVENOUS | Status: DC
  Filled 2016-07-13: qty 250

## 2016-07-13 MED ORDER — HEPARIN SODIUM (PORCINE) 1000 UNIT/ML IJ SOLN
INTRAMUSCULAR | Status: DC | PRN
  Administered 2016-07-13: 4500 [IU] via INTRAVENOUS
  Administered 2016-07-13: 4000 [IU] via INTRAVENOUS

## 2016-07-13 MED ORDER — VERAPAMIL HCL 2.5 MG/ML IV SOLN
INTRAVENOUS | Status: AC
  Filled 2016-07-13: qty 2

## 2016-07-13 MED ORDER — FENTANYL CITRATE (PF) 100 MCG/2ML IJ SOLN
INTRAMUSCULAR | Status: DC | PRN
  Administered 2016-07-13: 50 ug via INTRAVENOUS

## 2016-07-13 MED ORDER — VANCOMYCIN HCL 10 G IV SOLR
1250.0000 mg | INTRAVENOUS | Status: DC
  Filled 2016-07-13: qty 1250

## 2016-07-13 MED ORDER — INSULIN REGULAR HUMAN 100 UNIT/ML IJ SOLN
INTRAMUSCULAR | Status: DC
  Filled 2016-07-13: qty 2.5

## 2016-07-13 MED ORDER — HEPARIN (PORCINE) IN NACL 2-0.9 UNIT/ML-% IJ SOLN
INTRAMUSCULAR | Status: DC | PRN
  Administered 2016-07-13: 1000 mL

## 2016-07-13 MED ORDER — VERAPAMIL HCL 2.5 MG/ML IV SOLN
INTRAVENOUS | Status: DC | PRN
  Administered 2016-07-13: 10 mL via INTRA_ARTERIAL

## 2016-07-13 MED ORDER — CHLORHEXIDINE GLUCONATE CLOTH 2 % EX PADS
6.0000 | MEDICATED_PAD | Freq: Once | CUTANEOUS | Status: AC
  Administered 2016-07-14: 6 via TOPICAL

## 2016-07-13 MED ORDER — SODIUM CHLORIDE 0.9 % WEIGHT BASED INFUSION
1.0000 mL/kg/h | INTRAVENOUS | Status: DC
  Administered 2016-07-13: 1 mL/kg/h via INTRAVENOUS

## 2016-07-13 MED ORDER — TEMAZEPAM 7.5 MG PO CAPS: 15.0000 mg | ORAL_CAPSULE | Freq: Once | ORAL | Status: DC | PRN

## 2016-07-13 MED ORDER — LIDOCAINE HCL (PF) 1 % IJ SOLN
INTRAMUSCULAR | Status: AC
  Filled 2016-07-13: qty 30

## 2016-07-13 MED ORDER — FENTANYL CITRATE (PF) 100 MCG/2ML IJ SOLN
INTRAMUSCULAR | Status: AC
  Filled 2016-07-13: qty 2

## 2016-07-13 MED ORDER — PLASMA-LYTE 148 IV SOLN
INTRAVENOUS | Status: DC
  Filled 2016-07-13: qty 2.5

## 2016-07-13 MED ORDER — ASPIRIN 81 MG PO CHEW
324.0000 mg | CHEWABLE_TABLET | Freq: Once | ORAL | Status: AC
  Administered 2016-07-13: 324 mg via ORAL
  Filled 2016-07-13: qty 4

## 2016-07-13 MED ORDER — METOPROLOL TARTRATE 25 MG PO TABS
25.0000 mg | ORAL_TABLET | Freq: Two times a day (BID) | ORAL | Status: DC
  Administered 2016-07-13 (×2): 25 mg via ORAL
  Filled 2016-07-13 (×3): qty 2

## 2016-07-13 MED ORDER — METOPROLOL TARTRATE 12.5 MG HALF TABLET
12.5000 mg | ORAL_TABLET | Freq: Once | ORAL | Status: AC
  Administered 2016-07-14: 12.5 mg via ORAL
  Filled 2016-07-13: qty 1

## 2016-07-13 MED ORDER — TRANEXAMIC ACID (OHS) PUMP PRIME SOLUTION
2.0000 mg/kg | INTRAVENOUS | Status: DC
  Filled 2016-07-13: qty 1.6

## 2016-07-13 MED ORDER — TRANEXAMIC ACID (OHS) BOLUS VIA INFUSION
15.0000 mg/kg | INTRAVENOUS | Status: DC
  Filled 2016-07-13: qty 1202

## 2016-07-13 MED ORDER — IOPAMIDOL (ISOVUE-370) INJECTION 76%
INTRAVENOUS | Status: AC
  Filled 2016-07-13: qty 100

## 2016-07-13 MED ORDER — MAGNESIUM SULFATE 50 % IJ SOLN
40.0000 meq | INTRAMUSCULAR | Status: DC
  Filled 2016-07-13: qty 10

## 2016-07-13 MED ORDER — HEPARIN (PORCINE) IN NACL 2-0.9 UNIT/ML-% IJ SOLN
INTRAMUSCULAR | Status: AC
  Filled 2016-07-13: qty 1000

## 2016-07-13 MED ORDER — NITROGLYCERIN IN D5W 200-5 MCG/ML-% IV SOLN
0.0000 ug/min | INTRAVENOUS | Status: DC
  Administered 2016-07-13: 5 ug/min via INTRAVENOUS
  Filled 2016-07-13: qty 250

## 2016-07-13 MED ORDER — SODIUM CHLORIDE 0.9 % IV SOLN
250.0000 mL | INTRAVENOUS | Status: DC | PRN
  Administered 2016-07-14: 11:00:00 via INTRAVENOUS

## 2016-07-13 MED ORDER — DEXMEDETOMIDINE HCL IN NACL 400 MCG/100ML IV SOLN
0.1000 ug/kg/h | INTRAVENOUS | Status: DC
  Filled 2016-07-13 (×2): qty 100

## 2016-07-13 SURGICAL SUPPLY — 17 items
BALLN MAVERICK OTW 15X2.5 (BALLOONS) ×2
BALLOON MAVERICK OTW 15X2.5 (BALLOONS) ×1 IMPLANT
CATH INFINITI 5FR ANG PIGTAIL (CATHETERS) ×2 IMPLANT
CATH OPTITORQUE JACKY 4.0 5F (CATHETERS) ×2 IMPLANT
CATH VISTA GUIDE 6FR XBLAD3.5 (CATHETERS) ×2 IMPLANT
GLIDESHEATH SLEND SS 6F .021 (SHEATH) ×2 IMPLANT
GUIDEWIRE INQWIRE 1.5J.035X260 (WIRE) ×1 IMPLANT
INQWIRE 1.5J .035X260CM (WIRE) ×2
KIT ENCORE 26 ADVANTAGE (KITS) ×2 IMPLANT
KIT HEART LEFT (KITS) ×2 IMPLANT
PACK CARDIAC CATHETERIZATION (CUSTOM PROCEDURE TRAY) ×2 IMPLANT
SYR MEDRAD MARK V 150ML (SYRINGE) ×2 IMPLANT
TRANSDUCER W/STOPCOCK (MISCELLANEOUS) ×2 IMPLANT
TUBING CIL FLEX 10 FLL-RA (TUBING) ×2 IMPLANT
WIRE INTUITION HYDRO ST. 180CM (WIRE) ×2 IMPLANT
WIRE RUNTHROUGH .014X180CM (WIRE) ×2 IMPLANT
WIRE RUNTHROUGH .014X300CM (WIRE) ×2 IMPLANT

## 2016-07-13 NOTE — ED Provider Notes (Signed)
[facility]-EMERGENCY DEPT Provider Note   CSN: [id number] Arrival date & time: 07/13/16  0707     History   Chief Complaint Chief Complaint  Patient presents with  . Chest Pain    HPI Zachary Cuevas is a 51 y.o. male.  The history is provided by the patient and the spouse. No language interpreter was used.  Chest Pain      Zachary Cuevas is a 51 y.o. male who presents to the Emergency Department complaining of chest pain.  She awoke this morning at 4:30 with squeezing, central chest pain. He reports associated diaphoresis with weakness of bilateral arms. Symptoms are waxing and waning in nature but ongoing. No associated shortness of breath, abdominal pain, nausea, vomiting, leg swelling or pain. Hypertension, hyperlipidemia. He has a family history of cardiac disease.  Past Medical History:  Diagnosis Date  . Allergy    hay fever  . Hypercholesteremia   . Hypertension     Patient Active Problem List   Diagnosis Date Noted  . Non-STEMI (non-ST elevated myocardial infarction) (HCC) 07/13/2016  . Allergy     Past Surgical History:  Procedure Laterality Date  . FRACTURE SURGERY  2005   boxer fracture  of hands       Home Medications    Prior to Admission medications   Medication Sig Start Date End Date Taking? Authorizing Provider  Aspirin-Acetaminophen-Caffeine (GOODY HEADACHE PO) Take 1 packet by mouth daily as needed (pain).   Yes Historical Provider, MD  atorvastatin (LIPITOR) 20 MG tablet Take 1 tablet (20 mg total) by mouth daily. 12/22/15  Yes Donita Brooks, MD  esomeprazole (NEXIUM) 40 MG capsule Take 1 capsule (40 mg total) by mouth daily. 04/11/16  Yes Donita Brooks, MD  losartan-hydrochlorothiazide (HYZAAR) 100-12.5 MG tablet Take 1 tablet by mouth daily. Stop original losartan 03/25/16  Yes Donita Brooks, MD  Multiple Vitamin (MULTIVITAMIN) tablet Take 1 tablet by mouth daily.   Yes Historical Provider, MD  clindamycin (CLEOCIN) 300 MG capsule Take 1  capsule (300 mg total) by mouth 3 (three) times daily. Patient not taking: Reported on 03/25/2016 10/12/15   Dorena Bodo, PA-C  HYDROcodone-acetaminophen (NORCO/VICODIN) 5-325 MG tablet Take 1 tablet by mouth every 6 (six) hours as needed. Patient not taking: Reported on 03/25/2016 10/12/15   Dorena Bodo, PA-C    Family History Family History  Problem Relation Age of Onset  . Miscarriages / [place] Mother   . CAD Mother 58  . Heart disease Maternal Grandfather   . Diabetes Paternal Grandfather   . Heart disease Paternal Grandfather   . Heart disease Father 35  . Diabetes Brother     Social History Social History  Substance Use Topics  . Smoking status: Former Smoker    Quit date: 08/11/2011  . Smokeless tobacco: Never Used  . Alcohol use No     Allergies   Penicillins   Review of Systems Review of Systems  Cardiovascular: Positive for chest pain.  All other systems reviewed and are negative.    Physical Exam Updated Vital Signs BP (!) 143/109   Pulse 71   Temp 97.8 F (36.6 C) (Oral)   Resp 16   Ht 6\' 2"  (1.88 m)   Wt 176 lb 8 oz (80.1 kg)   SpO2 96%   BMI 22.66 kg/m   Physical Exam  Constitutional: He is oriented to person, place, and time. He appears well-developed and well-nourished.  HENT:  Head: Normocephalic and atraumatic.  Cardiovascular: Normal rate and regular rhythm.   No murmur heard. Pulmonary/Chest: Effort normal and breath sounds normal. No respiratory distress.  Abdominal: Soft. There is no tenderness. There is no rebound and no guarding.  Musculoskeletal: He exhibits no edema or tenderness.  Neurological: He is alert and oriented to person, place, and time.  Skin: Skin is warm and dry.  Psychiatric: He has a normal mood and affect. His behavior is normal.  Nursing note and vitals reviewed.    ED Treatments / Results  Labs (all labs ordered are listed, but only abnormal results are displayed) Labs Reviewed  BASIC METABOLIC  PANEL - Abnormal; Notable for the following:       Result Value   Glucose, Bld 173 (*)    All other components within normal limits  CBC - Abnormal; Notable for the following:    WBC 11.9 (*)    All other components within normal limits  TROPONIN I - Abnormal; Notable for the following:    Troponin I 3.05 (*)    All other components within normal limits  I-STAT TROPOININ, ED - Abnormal; Notable for the following:    Troponin i, poc 2.04 (*)    All other components within normal limits  MRSA PCR SCREENING  PROTIME-INR  TROPONIN I  TROPONIN I  HEPARIN LEVEL (UNFRACTIONATED)  I-STAT TROPOININ, ED    EKG  EKG Interpretation  Date/Time:  Wednesday July 13 2016 08:02:09 EST Ventricular Rate:  93 PR Interval:  146 QRS Duration: 77 QT Interval:  372 QTC Calculation: 463 R Axis:   69 Text Interpretation:  Sinus rhythm Confirmed by Lincoln Brighamees, Liz [phone]([id number]) on 07/13/2016 8:11:21 AM Also confirmed by Lincoln Brighamees, Liz [phone]), editor [place] CT, [place]n [phone])  on 07/13/2016 11:27:18 AM       Radiology Dg Chest 2 View  Result Date: 07/13/2016 CLINICAL DATA:  51 year old male with chest pain since 0400 hours. Initial encounter. Former smoker. EXAM: CHEST  2 VIEW COMPARISON:  06/06/2013. FINDINGS: Large lung volumes, mildly increased since 2015. Mediastinal contours remain normal. Visualized tracheal air column is within normal limits. No pneumothorax, pulmonary edema, pleural effusion or confluent pulmonary opacity. No acute osseous abnormality identified. Negative visible bowel gas pattern. IMPRESSION: Pulmonary hyperinflation.  No acute cardiopulmonary abnormality. Electronically Signed   By: Odessa FlemingH  Hall M.D.   On: 07/13/2016 07:54    Procedures Procedures (including critical care time) CRITICAL CARE Performed by: Tilden FossaElizabeth Lelaina Oatis   Total critical care time: 30 minutes  Critical care time was exclusive of separately billable procedures and treating other patients.  Critical care was necessary to  treat or prevent imminent or life-threatening deterioration.  Critical care was time spent personally by me on the following activities: development of treatment plan with patient and/or surrogate as well as nursing, discussions with consultants, evaluation of patient's response to treatment, examination of patient, obtaining history from patient or surrogate, ordering and performing treatments and interventions, ordering and review of laboratory studies, ordering and review of radiographic studies, pulse oximetry and re-evaluation of patient's condition.   Medications Ordered in ED Medications  pantoprazole (PROTONIX) EC tablet 40 mg ( Oral MAR Unhold 07/13/16 1547)  nitroGLYCERIN 50 mg in dextrose 5 % 250 mL (0.2 mg/mL) infusion ( Intravenous MAR Unhold 07/13/16 1547)  acetaminophen (TYLENOL) tablet 650 mg ( Oral MAR Unhold 07/13/16 1547)  ondansetron (ZOFRAN) injection 4 mg ( Intravenous MAR Unhold 07/13/16 1547)  aspirin EC tablet 81 mg ( Oral MAR Unhold 07/13/16 1547)  0.9% sodium chloride infusion (  1 mL/kg/hr  83.9 kg Intravenous New Bag/Given 07/13/16 1545)  sodium chloride flush (NS) 0.9 % injection 3 mL (not administered)  sodium chloride flush (NS) 0.9 % injection 3 mL (not administered)  0.9 %  sodium chloride infusion (not administered)  atorvastatin (LIPITOR) tablet 80 mg (not administered)  metoprolol tartrate (LOPRESSOR) tablet 25 mg (not administered)  heparin ADULT infusion 100 units/mL (25000 units/216mL sodium chloride 0.45%) (not administered)  losartan (COZAAR) tablet 100 mg (not administered)    And  hydrochlorothiazide (MICROZIDE) capsule 12.5 mg (not administered)  aspirin chewable tablet 324 mg (324 mg Oral Given 07/13/16 0751)  morphine 4 MG/ML injection 4 mg (4 mg Intravenous Given 07/13/16 1133)  heparin bolus via infusion 4,000 Units (4,000 Units Intravenous Bolus from Bag 07/13/16 1137)     Initial Impression / Assessment and Plan / ED Course  I have reviewed the  triage vital signs and the nursing notes.  Pertinent labs & imaging results that were available during my care of the patient were reviewed by me and considered in my medical decision making (see chart for details).     Patient here for evaluation of chest pain since this morning. EKG with no ischemic changes. Heart score is 4 due to history, age, risk factors. His pain did initially worsen after nitroglycerin but then improved. Current clinical picture is not c/w dissection, PE.    Cardiology consulted regarding Chest pain admission.    Repeat troponin elevated, c/w NSTEMI - pt updated of findings of studies, heparin drip started in ED.  Cardiology plans to admit.      Final Clinical Impressions(s) / ED Diagnoses   Final diagnoses:  Chest pain, unspecified type  NSTEMI (non-ST elevated myocardial infarction) [facility])    New Prescriptions Current Discharge Medication List       Tilden Fossa, MD 07/13/16 1836

## 2016-07-13 NOTE — ED Notes (Signed)
Arrived back to department.

## 2016-07-13 NOTE — Anesthesia Preprocedure Evaluation (Addendum)
Anesthesia Evaluation  Patient identified by MRN, date of birth, ID band Patient awake    Reviewed: Allergy & Precautions, NPO status , Patient's Chart, lab work & pertinent test results  Airway Mallampati: II  TM Distance: <3 FB Neck ROM: Full    Dental  (+) Dental Advisory Given, Upper Dentures   Pulmonary neg pulmonary ROS, former smoker,    Pulmonary exam normal breath sounds clear to auscultation       Cardiovascular hypertension, Pt. on medications + Past MI  Normal cardiovascular exam Rhythm:Regular Rate:Normal  HLD  LHC 07/13/16: Conclusion   Mid LAD to Dist LAD lesion, 100 %stenosed. Prox RCA lesion, 80 %stenosed. Mid RCA lesion, 60 %stenosed. Dist RCA lesion, 70 %stenosed. The left ventricular systolic function is normal. LV end diastolic pressure is mildly elevated. The left ventricular ejection fraction is 35-45% by visual estimate.   1. Severe two-vessel coronary artery disease. The mid LAD is occluded with right-to-left collaterals and left to left collaterals. The RCA is diseased in the proximal, mid and distal segments. 2. Moderately reduced LV systolic function with an ejection fraction of 35-40% with anterior, apical and distal inferior hypokinesis. Mildly elevated left ventricular end-diastolic pressure. 3. Unsuccessful attempted angioplasty of the LAD due to inability to cross the occlusion with a wire.     Neuro/Psych negative neurological ROS     GI/Hepatic Neg liver ROS, GERD  Medicated,  Endo/Other  negative endocrine ROS  Renal/GU negative Renal ROS     Musculoskeletal negative musculoskeletal ROS (+)   Abdominal   Peds  Hematology negative hematology ROS (+)   Anesthesia Other Findings Day of surgery medications reviewed with the patient.  Reproductive/Obstetrics                            Anesthesia Physical Anesthesia Plan  ASA: IV  Anesthesia  Plan: General   Post-op Pain Management:    Induction: Intravenous  Airway Management Planned: Oral ETT  Additional Equipment: Arterial line, CVP, PA Cath, Ultrasound Guidance Line Placement and TEE  Intra-op Plan:   Post-operative Plan: Post-operative intubation/ventilation  Informed Consent:   Plan Discussed with: CRNA  Anesthesia Plan Comments:         Anesthesia Quick Evaluation

## 2016-07-13 NOTE — Progress Notes (Signed)
Pre-op Cardiac Surgery  Carotid Findings: Bilateral- No evidence of significant ICA stenosis. Vertebral artery flow is antegrade  Upper Extremity Right Left  Brachial Pressures 125 Triphasic 139 Triphasic  Radial Waveforms Triphasic Triphasic  Ulnar Waveforms Triphasic Triphasic  Palmar Arch (Allen's Test) Normal Normal   Findings:  Doppler waveforms remained normal bilaterally with both radial and ulnar compressions    Lower  Extremity Right Left  Dorsalis Pedis 193 Triphasic 205 Triphasic  Posterior Tibial 195 Triphasic 175  Ankle/Brachial Indices 1.4 1.5    Findings:  ABIs indicate normal arterial flow bilaterally at rest.  Zachary DeitersVirginia Edy Cuevas, RVS 07/13/2016 7:00PM

## 2016-07-13 NOTE — Progress Notes (Signed)
ANTICOAGULATION CONSULT NOTE - Initial Consult  Pharmacy Consult for heparin Indication: chest pain/ACS  Allergies  Allergen Reactions  . Penicillins Other (See Comments)    Childhood allergy    Patient Measurements: Height: 6\' 3"  (190.5 cm) Weight: 185 lb (83.9 kg) IBW/kg (Calculated) : 84.5 Heparin Dosing Weight: 83.9kg  Vital Signs: Temp: 97.6 F (36.4 C) (02/14 0714) Temp Source: Oral (02/14 0714) BP: 148/97 (02/14 1045) Pulse Rate: 70 (02/14 1045)  Labs:  Recent Labs  07/13/16 0719  HGB 14.8  HCT 44.0  PLT 227  CREATININE 1.16    Estimated Creatinine Clearance: 91.4 mL/min (by C-G formula based on SCr of 1.16 mg/dL).   Medical History: Past Medical History:  Diagnosis Date  . Allergy    hay fever  . Hypercholesteremia   . Hypertension     Medications:  Infusions:  . heparin      Assessment: 50 yom presented to the ED with CP. Troponin elevated and now starting IV heparin. Baseline H/H and platelets are WNL. He is not on anticoagulation PTA.   Goal of Therapy:  Heparin level 0.3-0.7 units/ml Monitor platelets by anticoagulation protocol: Yes   Plan:  Heparin bolus 4000 units IV x 1 Heparin gtt 1200 units/hr CHeck a 6 hr heparin level Daily heparin level and CBC  Zarie Kosiba, Drake Leachachel Lynn 07/13/2016,11:25 AM

## 2016-07-13 NOTE — ED Triage Notes (Addendum)
Pt from home with c/o central cp pain starting this morning around 430 am that feels like a squeezing with bilateral arm weakness.  Pt states he was extremely diaphoretic initially.  Denies SOB or other complaints.  Mother had MI in her 30s.  Pt reports taking Tums this morning with no relief.  Has not taken aspirin or any other medication.  Took BP medications last night at normal time. A&O.

## 2016-07-13 NOTE — Consult Note (Signed)
Reason for Consult:2 vessel CAD, s/p MI Referring Physician: Dr. Arida  Zachary Cuevas is an 50 y.o. male.  HPI: 50 yo man with a history of hyperlipidemia, hypertension, remote tobacco abuse and a strong FH of CAD who presents with a cc/o CP.  He was awakened from sleep around 4:30 this morning. He thought he had indigestion. He took Tums without relief. The pain was severe like someone squeezing his heart. He was diaphoretic and had nausea and vomited. He came to the ED and his pain resolved with NTG x 2. He ruled in for NSTEMI with a troponin of 3.05.  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 Attempts to pass a wire in the LAD were unsuccessful.   Past Medical History:  Diagnosis Date  . Allergy    hay fever  . Hypercholesteremia   . Hypertension     Past Surgical History:  Procedure Laterality Date  . FRACTURE SURGERY  2005   boxer fracture  of hands    Family History  Problem Relation Age of Onset  . Miscarriages / Stillbirths Mother   . CAD Mother 32  . Heart disease Maternal Grandfather   . Diabetes Paternal Grandfather   . Heart disease Paternal Grandfather   . Heart disease Father 48  . Diabetes Brother     Social History:  reports that he quit smoking about 4 years ago. He has never used smokeless tobacco. He reports that he uses drugs, including Marijuana. He reports that he does not drink alcohol.  Allergies:  Allergies  Allergen Reactions  . Penicillins Other (See Comments)    Childhood allergy    Medications:  Scheduled: . [START ON 07/15/2016] aspirin EC  81 mg Oral Daily  . atorvastatin  80 mg Oral q1800  . losartan  100 mg Oral Daily   And  . hydrochlorothiazide  12.5 mg Oral Daily  . metoprolol tartrate  25 mg Oral BID  . pantoprazole  40 mg Oral Daily  . [START ON 07/14/2016] sodium chloride flush  3 mL Intravenous Q12H    Results for orders placed or performed during the hospital encounter of 07/13/16 (from the  past 48 hour(s))  Basic metabolic panel     Status: Abnormal   Collection Time: 07/13/16  7:19 AM  Result Value Ref Range   Sodium 141 135 - 145 mmol/L   Potassium 3.6 3.5 - 5.1 mmol/L   Chloride 101 101 - 111 mmol/L   CO2 29 22 - 32 mmol/L   Glucose, Bld 173 (H) 65 - 99 mg/dL   BUN 20 6 - 20 mg/dL   Creatinine, Ser 1.16 0.61 - 1.24 mg/dL   Calcium 10.0 8.9 - 10.3 mg/dL   GFR calc non Af Amer >60 >60 mL/min   GFR calc Af Amer >60 >60 mL/min    Comment: (NOTE) The eGFR has been calculated using the CKD EPI equation. This calculation has not been validated in all clinical situations. eGFR's persistently <60 mL/min signify possible Chronic Kidney Disease.    Anion gap 11 5 - 15  CBC     Status: Abnormal   Collection Time: 07/13/16  7:19 AM  Result Value Ref Range   WBC 11.9 (H) 4.0 - 10.5 K/uL   RBC 5.29 4.22 - 5.81 MIL/uL   Hemoglobin 14.8 13.0 - 17.0 g/dL   HCT 44.0 39.0 - 52.0 %   MCV 83.2 78.0 - 100.0 fL   MCH 28.0   26.0 - 34.0 pg   MCHC 33.6 30.0 - 36.0 g/dL   RDW 13.2 11.5 - 15.5 %   Platelets 227 150 - 400 K/uL  Protime-INR     Status: None   Collection Time: 07/13/16  7:19 AM  Result Value Ref Range   Prothrombin Time 12.8 11.4 - 15.2 seconds   INR 0.96   I-stat troponin, ED     Status: None   Collection Time: 07/13/16  7:37 AM  Result Value Ref Range   Troponin i, poc 0.02 0.00 - 0.08 ng/mL   Comment 3            Comment: Due to the release kinetics of cTnI, a negative result within the first hours of the onset of symptoms does not rule out myocardial infarction with certainty. If myocardial infarction is still suspected, repeat the test at appropriate intervals.   I-stat troponin, ED     Status: Abnormal   Collection Time: 07/13/16 11:05 AM  Result Value Ref Range   Troponin i, poc 2.04 (HH) 0.00 - 0.08 ng/mL   Comment NOTIFIED PHYSICIAN    Comment 3            Comment: Due to the release kinetics of cTnI, a negative result within the first hours of the  onset of symptoms does not rule out myocardial infarction with certainty. If myocardial infarction is still suspected, repeat the test at appropriate intervals.   Troponin I (q 6hr x 3)     Status: Abnormal   Collection Time: 07/13/16 12:21 PM  Result Value Ref Range   Troponin I 3.05 (HH) <0.03 ng/mL    Comment: CRITICAL RESULT CALLED TO, READ BACK BY AND VERIFIED WITH: S.LAUDERMILK,RN 07/13/16 @ 1346 BY N.LIVINGSTON   POCT Activated clotting time     Status: None   Collection Time: 07/13/16  2:39 PM  Result Value Ref Range   Activated Clotting Time 268 seconds  MRSA PCR Screening     Status: None   Collection Time: 07/13/16  3:54 PM  Result Value Ref Range   MRSA by PCR NEGATIVE NEGATIVE    Comment:        The GeneXpert MRSA Assay (FDA approved for NASAL specimens only), is one component of a comprehensive MRSA colonization surveillance program. It is not intended to diagnose MRSA infection nor to guide or monitor treatment for MRSA infections.     Dg Chest 2 View  Result Date: 07/13/2016 CLINICAL DATA:  50-year-old male with chest pain since 0400 hours. Initial encounter. Former smoker. EXAM: CHEST  2 VIEW COMPARISON:  06/06/2013. FINDINGS: Large lung volumes, mildly increased since 2015. Mediastinal contours remain normal. Visualized tracheal air column is within normal limits. No pneumothorax, pulmonary edema, pleural effusion or confluent pulmonary opacity. No acute osseous abnormality identified. Negative visible bowel gas pattern. IMPRESSION: Pulmonary hyperinflation.  No acute cardiopulmonary abnormality. Electronically Signed   By: H  Hall M.D.   On: 07/13/2016 07:54    Review of Systems  Constitutional: Positive for diaphoresis. Negative for chills and fever.  Respiratory: Positive for shortness of breath.   Cardiovascular: Positive for chest pain.  Gastrointestinal: Positive for nausea and vomiting.  All other systems reviewed and are negative.  Blood pressure  (!) 145/107, pulse 80, temperature 97.8 F (36.6 C), temperature source Oral, resp. rate 19, height 6' 2" (1.88 m), weight 176 lb 8 oz (80.1 kg), SpO2 97 %. Physical Exam  Vitals reviewed. Constitutional: He is oriented to person, place, and   time. He appears well-developed and well-nourished. No distress.  HENT:  Head: Normocephalic and atraumatic.  Mouth/Throat: No oropharyngeal exudate.  Eyes: Conjunctivae and EOM are normal. No scleral icterus.  Neck: Neck supple. No thyromegaly present.  Cardiovascular: Normal rate, regular rhythm, normal heart sounds and intact distal pulses.  Exam reveals no gallop and no friction rub.   No murmur heard. Respiratory: Effort normal and breath sounds normal. No respiratory distress. He has no wheezes. He has no rales.  GI: Soft. He exhibits no distension. There is no tenderness.  Musculoskeletal: He exhibits no edema.  Lymphadenopathy:    He has no cervical adenopathy.  Neurological: He is alert and oriented to person, place, and time. No cranial nerve deficit.  Motor 5/5  Skin: Skin is warm and dry.    Assessment/Plan: 50 yo man with multiple CRF including a strong family history of premature CAD who presents with unstable CP and ruled in for a NSTEMI. At cath has severe 2 vessel CAD no amenable to PCA. CABG indicated for survival benefit and relief of symptoms.  I discussed the general nature of the procedure, the need for general anesthesia, the use of cardiopulmonary bypass and the incisions to be used with Mr. Hartwig and his family. We discussed the expected hospital stay, overall recovery and short and long term outcomes. I informed him of the indications, risks, benefits and alternatives. He understands the risks include, but are not limited to death, stroke, MI, DVT/PE, bleeding, possible need for transfusion, infections, cardiac arrhythmias, as well as other organ system dysfunction including respiratory, renal, or GI complications.   He  accepts the risks and agreess to proceed.  For CABG in AM. Will plan to use bilateral IMA.  Steven C Hendrickson 07/13/2016, 6:45 PM     

## 2016-07-13 NOTE — ED Notes (Signed)
Patient transported to X-ray 

## 2016-07-13 NOTE — H&P (Addendum)
History & Physical    Patient ID: Zachary PinaDaniel Treptow MRN: [id number], DOB/AGE: [birth year]   Admit date: 07/13/2016   Primary Physician: Leo GrosserPICKARD,WARREN TOM, MD Primary Cardiologist: New- Dr. Eldridge DaceVaranasi   History of Present Illness    Zachary Cuevas is a 51 y.o. male with past medical history of HTN, HLD and family history including father needing CABG at age 50, mopther died at age 50 of MI during severe pancreatitis and maternal GF having MI in his 4030's who [facility] for evaluation of chest pain.  The patient was awoken at 4:30 this morning with severe substernal squeezing pain with a heaviness in the upper arms bilaterally. The severe pain lasted for several minutes, followed by a moderate constant soreness. He continued to have waves of severe pain. He was diaphoretic and had brief nausea with vomiting X1. No dyspnea, orthopnea, palpitations, or edema. He has had this pain to a lesser degree a few other times in his life with the last time being around 7 or 8 months ago. He has had no recent illness. No heart failure like symptoms.   In the ED his chest pain resolved with SL NTG X2. He now has a mild vague chest soreness.    He smoked 1 PPD since age 51 and quit about 4 years ago. He smokes marijuana occasionally and denies any other drug use except cocaine over 30 years ago. He was started on Hyzaar for HTN in 02/2016. He takes lipitor for HLD.   His first troponin was negative but second is 2.04 (POC). EKG is without acute ischemic changes CXR Pulmonary hyperinflation.  No acute cardiopulmonary abnormality.  He has never had cardiac evaluation such as cath or stress test.   Past Medical History    Past Medical History:  Diagnosis Date  . Allergy    hay fever  . Hypercholesteremia   . Hypertension     Past Surgical History:  Procedure Laterality Date  . FRACTURE SURGERY  2005   boxer fracture  of hands     Allergies  Allergies  Allergen Reactions  .  Penicillins Other (See Comments)    Childhood allergy     Home Medications    Prior to Admission medications   Medication Sig Start Date End Date Taking? Authorizing Provider  Aspirin-Acetaminophen-Caffeine (GOODY HEADACHE PO) Take 1 packet by mouth daily as needed (pain).   Yes Historical Provider, MD  atorvastatin (LIPITOR) 20 MG tablet Take 1 tablet (20 mg total) by mouth daily. 12/22/15  Yes Donita BrooksWarren T Pickard, MD  esomeprazole (NEXIUM) 40 MG capsule Take 1 capsule (40 mg total) by mouth daily. 04/11/16  Yes Donita BrooksWarren T Pickard, MD  losartan-hydrochlorothiazide (HYZAAR) 100-12.5 MG tablet Take 1 tablet by mouth daily. Stop original losartan 03/25/16  Yes Donita BrooksWarren T Pickard, MD  Multiple Vitamin (MULTIVITAMIN) tablet Take 1 tablet by mouth daily.   Yes Historical Provider, MD  clindamycin (CLEOCIN) 300 MG capsule Take 1 capsule (300 mg total) by mouth 3 (three) times daily. Patient not taking: Reported on 03/25/2016 10/12/15   Dorena BodoMary B Dixon, PA-C  HYDROcodone-acetaminophen (NORCO/VICODIN) 5-325 MG tablet Take 1 tablet by mouth every 6 (six) hours as needed. Patient not taking: Reported on 03/25/2016 10/12/15   Dorena BodoMary B Dixon, PA-C  losartan (COZAAR) 50 MG tablet Take 1 tablet (50 mg total) by mouth daily. Patient not taking: Reported on 07/13/2016 08/21/15   Donita BrooksWarren T Pickard, MD    Family History    Family History  Problem Relation Age of Onset  . Miscarriages / [place] Mother   . Heart disease Maternal Grandfather   . Diabetes Paternal Grandfather   . Heart disease Paternal Grandfather   . Heart disease Father 92  . Diabetes Brother     Social History    Social History   Social History  . Marital status: Married    Spouse name: N/A  . Number of children: N/A  . Years of education: N/A   Occupational History  . Not on file.   Social History Main Topics  . Smoking status: Former Smoker    Quit date: 08/11/2011  . Smokeless tobacco: Never Used  . Alcohol use No  . Drug use:  Yes    Types: Marijuana  . Sexual activity: Yes   Other Topics Concern  . Not on file   Social History Narrative  . No narrative on file     Review of Systems   General:  No chills, fever, night sweats or weight changes.  Cardiovascular:  Positive for chest pain as above, No dyspnea on exertion, edema, orthopnea, palpitations, paroxysmal nocturnal dyspnea. Dermatological: No rash, lesions/masses Respiratory: No cough, dyspnea Urologic: No hematuria, dysuria Abdominal:   No nausea, vomiting, diarrhea, bright red blood per rectum, melena, or hematemesis Neurologic:  No visual changes, wkns, changes in mental status. All other systems reviewed and are otherwise negative except as noted above.  Physical Exam   Blood pressure 148/97, pulse 70, temperature 97.6 F (36.4 C), temperature source Oral, resp. rate 17, height 6\' 3"  (1.905 m), weight 185 lb (83.9 kg), SpO2 97 %.  General: Well developed, well nourished,male with multiple tatoos, in no acute distress. Head: Normocephalic, atraumatic, sclera non-icteric, no xanthomas, nares are without discharge.  Neck: No carotid bruits. JVD not elevated.  Lungs: Respirations regular and unlabored, without wheezes or rales.  Heart: Regular rate and rhythm. No S3 or S4.  No murmur, no rubs, or gallops appreciated. Abdomen: Soft, non-tender, non-distended with normoactive bowel sounds. No hepatomegaly. No rebound/guarding. No obvious abdominal masses. Msk:  Strength and tone appear normal for age. No joint deformities or effusions. Extremities: No clubbing or cyanosis. No edema.  Distal pedal pulses are 2+ bilaterally. Neuro: Alert and oriented X 3. Moves all extremities spontaneously. No focal deficits noted. Psych:  Responds to questions appropriately with a normal affect. Skin: No rashes or lesions noted  Labs    Troponin (Point of Care Test)  Recent Labs  07/13/16 1105  TROPIPOC 2.04*   No results for input(s): CKTOTAL, CKMB,  TROPONINI in the last 72 hours. Lab Results  Component Value Date   WBC 11.9 (H) 07/13/2016   HGB 14.8 07/13/2016   HCT 44.0 07/13/2016   MCV 83.2 07/13/2016   PLT 227 07/13/2016    Recent Labs Lab 07/13/16 0719  NA 141  K 3.6  CL 101  CO2 29  BUN 20  CREATININE 1.16  CALCIUM 10.0  GLUCOSE 173*   Lab Results  Component Value Date   CHOL 218 (H) 11/20/2015   HDL 30 (L) 11/20/2015   LDLCALC 131 (H) 11/20/2015   TRIG 287 (H) 11/20/2015    No results found for: BNP No results found for: PROBNP No results for input(s): INR in the last 72 hours.    Radiology Studies    Dg Chest 2 View  Result Date: 07/13/2016 CLINICAL DATA:  51 year old male with chest pain since 0400 hours. Initial encounter. Former smoker. EXAM: CHEST  2 VIEW COMPARISON:  06/06/2013. FINDINGS: Large lung volumes, mildly increased since 2015. Mediastinal contours remain normal. Visualized tracheal air column is within normal limits. No pneumothorax, pulmonary edema, pleural effusion or confluent pulmonary opacity. No acute osseous abnormality identified. Negative visible bowel gas pattern. IMPRESSION: Pulmonary hyperinflation.  No acute cardiopulmonary abnormality. Electronically Signed   By: Odessa Fleming M.D.   On: 07/13/2016 07:54    EKG & Cardiac Imaging    EKG: NSR 93 bpm, no acute ischemia  ECHOCARDIOGRAM: Pending  Assessment & Plan    Active Problems:   * No active hospital problems. *  Non-STEMI -Severe substernal chest tightness with heaviness in bilateal upper arms this morning. Improved with NTG in ED. Now with mild chest soreness. -Troponin up to 2.04 (POC). Will continue to cycle enzymes -Family history of premature CAD -EKG without acute ischemic changes -CXR Pulmonary hyperinflation.  No acute cardiopulmonary abnormality. -With elevated troponin and chest pain, will likely need cardiac cath for definitive cardiac evaluation. Will discuss with Dr. Eldridge Dace- Cath arranged for  today. -Heparin infusing. Aspirin given. NTG drip and morphine as needed -Check echo  Hypertension -Continue hyzaar  HLD -11/20/15: LDL 131, triglycerides 287 -Will recheck fasting lipid panel -Continue lipitor  Signed, Berton Bon, NP-C 07/13/2016, 11:21 AM Pager: [phone]  I have examined the patient and reviewed assessment and plan and discussed with patient.  Agree with above as stated.  Patient with NSTEMI.  I personally examined ECG and lab work as well. I made the decision for the patient have cardiac catheterization. Risks and benefits of cath explained and he is willing to have cath.  Early family h/o CAD.  IV heparin.  Start NTG IV for residual chest pain.  Describes it as a 3/10.  At worst, it was a 10 out of 10 this morning. Hopefully, nitroglycerin will help until he can go to the Cath Lab.  Regular rate and rhythm, breath sounds clear bilaterally, no edema, 2+ right radial pulse. Multiple tattoos on skin.  Blood pressure elevated. Currently on an ARB. Hopefully, nitroglycerin will help.  If nitroglycerin does not help, he will need to go to the Cath Lab emergently. I spoke with the Cath Lab physician regarding the case and plan. His cath will be done today.  No acute ST segment changes. He may have an occluded circumflex or subtotaled circumflex causing his symptoms.  Critical care time 45 minutes  Lance Muss

## 2016-07-13 NOTE — Interval H&P Note (Signed)
Cath Lab Visit (complete for each Cath Lab visit)  Clinical Evaluation Leading to the Procedure:   ACS: Yes.    Non-ACS:  n/a   History and Physical Interval Note:  07/13/2016 2:10 PM  Juliene PinaDaniel Fennema  has presented today for surgery, with the diagnosis of nstemi  The various methods of treatment have been discussed with the patient and family. After consideration of risks, benefits and other options for treatment, the patient has consented to  Procedure(s): Left Heart Cath and Coronary Angiography (N/A) as a surgical intervention .  The patient's history has been reviewed, patient examined, no change in status, stable for surgery.  I have reviewed the patient's chart and labs.  Questions were answered to the patient's satisfaction.     Lorine BearsMuhammad Kenyette Gundy

## 2016-07-13 NOTE — ED Notes (Signed)
ED Provider at bedside. 

## 2016-07-13 NOTE — Progress Notes (Signed)
ANTICOAGULATION CONSULT NOTE  Pharmacy Consult for heparin Indication: chest pain/ACS  Allergies  Allergen Reactions  . Penicillins Other (See Comments)    Childhood allergy    Patient Measurements: Height: 6\' 3"  (190.5 cm) Weight: 185 lb (83.9 kg) IBW/kg (Calculated) : 84.5 Heparin Dosing Weight: 83.9kg  Vital Signs: Temp: 97.6 F (36.4 C) (02/14 0714) Temp Source: Oral (02/14 0714) BP: 153/114 (02/14 1519) Pulse Rate: 84 (02/14 1519)  Labs:  Recent Labs  07/13/16 0719 07/13/16 1221  HGB 14.8  --   HCT 44.0  --   PLT 227  --   LABPROT 12.8  --   INR 0.96  --   CREATININE 1.16  --   TROPONINI  --  3.05*    Estimated Creatinine Clearance: 91.4 mL/min (by C-G formula based on SCr of 1.16 mg/dL).   Medical History: Past Medical History:  Diagnosis Date  . Allergy    hay fever  . Hypercholesteremia   . Hypertension     Medications:  Infusions:  . sodium chloride    . [MAR Hold] nitroGLYCERIN 10 mcg/min (07/13/16 1517)    Assessment: 50 yom presented to the ED with CP. Troponin elevated and now starting IV heparin. Baseline H/H and platelets are WNL. He is not on anticoagulation PTA.   Pharmacy is consulted to restart heparin 6h post sheath removal. Sheath was removed at 1516.  Goal of Therapy:  Heparin level 0.3-0.7 units/ml Monitor platelets by anticoagulation protocol: Yes   Plan:  Restart heparin 1200 units/hr at 2115 6h HL Daily heparin level and CBC  Arlean Hoppingorey M. Newman PiesBall, PharmD, BCPS Clinical Pharmacist [phone] 07/13/2016,3:42 PM

## 2016-07-14 ENCOUNTER — Inpatient Hospital Stay (HOSPITAL_COMMUNITY): Payer: BLUE CROSS/BLUE SHIELD | Admitting: Certified Registered"

## 2016-07-14 ENCOUNTER — Encounter (HOSPITAL_COMMUNITY)
Admission: EM | Disposition: A | Discharge status: Home / Self Care | Payer: Self-pay | Source: Home / Self Care | Attending: Thoracic Surgery (Cardiothoracic Vascular Surgery)

## 2016-07-14 ENCOUNTER — Encounter (HOSPITAL_COMMUNITY): Payer: Self-pay | Admitting: Cardiovascular Disease

## 2016-07-14 ENCOUNTER — Inpatient Hospital Stay (HOSPITAL_COMMUNITY): Payer: BLUE CROSS/BLUE SHIELD

## 2016-07-14 DIAGNOSIS — I2510 Atherosclerotic heart disease of native coronary artery without angina pectoris: Secondary | ICD-10-CM | POA: Diagnosis present

## 2016-07-14 HISTORY — PX: TEE WITHOUT CARDIOVERSION: SHX5443

## 2016-07-14 HISTORY — PX: CORONARY ARTERY BYPASS GRAFT: SHX141

## 2016-07-14 LAB — POCT I-STAT, CHEM 8
BUN: 13 mg/dL (ref 6–20)
BUN: 12 mg/dL (ref 6–20)
BUN: 13 mg/dL (ref 6–20)
BUN: 13 mg/dL (ref 6–20)
BUN: 13 mg/dL (ref 6–20)
BUN: 16 mg/dL (ref 6–20)
CALCIUM ION: 1.09 mmol/L — AB (ref 1.15–1.40)
CALCIUM ION: 1.17 mmol/L (ref 1.15–1.40)
CHLORIDE: 103 mmol/L (ref 101–111)
CHLORIDE: 98 mmol/L — AB (ref 101–111)
CHLORIDE: 102 mmol/L (ref 101–111)
CHLORIDE: 103 mmol/L (ref 101–111)
Calcium, Ion: 1.29 mmol/L (ref 1.15–1.40)
Calcium, Ion: 1.29 mmol/L (ref 1.15–1.40)
Calcium, Ion: 1.14 mmol/L — ABNORMAL LOW (ref 1.15–1.40)
Calcium, Ion: 1.21 mmol/L (ref 1.15–1.40)
Chloride: 103 mmol/L (ref 101–111)
Chloride: 104 mmol/L (ref 101–111)
Creatinine, Ser: 0.7 mg/dL (ref 0.61–1.24)
Creatinine, Ser: 0.6 mg/dL — ABNORMAL LOW (ref 0.61–1.24)
Creatinine, Ser: 0.8 mg/dL (ref 0.61–1.24)
Creatinine, Ser: 0.7 mg/dL (ref 0.61–1.24)
Creatinine, Ser: 0.8 mg/dL (ref 0.61–1.24)
Creatinine, Ser: 0.9 mg/dL (ref 0.61–1.24)
GLUCOSE: 108 mg/dL — AB (ref 65–99)
Glucose, Bld: 105 mg/dL — ABNORMAL HIGH (ref 65–99)
Glucose, Bld: 100 mg/dL — ABNORMAL HIGH (ref 65–99)
Glucose, Bld: 109 mg/dL — ABNORMAL HIGH (ref 65–99)
Glucose, Bld: 108 mg/dL — ABNORMAL HIGH (ref 65–99)
Glucose, Bld: 128 mg/dL — ABNORMAL HIGH (ref 65–99)
HCT: 27 % — ABNORMAL LOW (ref 39.0–52.0)
HEMATOCRIT: 36 % — AB (ref 39.0–52.0)
HEMATOCRIT: 26 % — AB (ref 39.0–52.0)
HEMATOCRIT: 33 % — AB (ref 39.0–52.0)
HEMATOCRIT: 27 % — AB (ref 39.0–52.0)
HEMATOCRIT: 36 % — AB (ref 39.0–52.0)
HEMOGLOBIN: 12.2 g/dL — AB (ref 13.0–17.0)
HEMOGLOBIN: 12.2 g/dL — AB (ref 13.0–17.0)
Hemoglobin: 11.2 g/dL — ABNORMAL LOW (ref 13.0–17.0)
Hemoglobin: 8.8 g/dL — ABNORMAL LOW (ref 13.0–17.0)
Hemoglobin: 9.2 g/dL — ABNORMAL LOW (ref 13.0–17.0)
Hemoglobin: 9.2 g/dL — ABNORMAL LOW (ref 13.0–17.0)
POTASSIUM: 3.9 mmol/L (ref 3.5–5.1)
POTASSIUM: 3.8 mmol/L (ref 3.5–5.1)
POTASSIUM: 4 mmol/L (ref 3.5–5.1)
POTASSIUM: 5.4 mmol/L — AB (ref 3.5–5.1)
POTASSIUM: 4.1 mmol/L (ref 3.5–5.1)
Potassium: 4.6 mmol/L (ref 3.5–5.1)
SODIUM: 142 mmol/L (ref 135–145)
SODIUM: 136 mmol/L (ref 135–145)
SODIUM: 141 mmol/L (ref 135–145)
SODIUM: 140 mmol/L (ref 135–145)
SODIUM: 141 mmol/L (ref 135–145)
Sodium: 139 mmol/L (ref 135–145)
TCO2: 28 mmol/L (ref 0–100)
TCO2: 26 mmol/L (ref 0–100)
TCO2: 29 mmol/L (ref 0–100)
TCO2: 26 mmol/L (ref 0–100)
TCO2: 28 mmol/L (ref 0–100)
TCO2: 24 mmol/L (ref 0–100)

## 2016-07-14 LAB — POCT I-STAT 3, ART BLOOD GAS (G3+)
ACID-BASE DEFICIT: 1 mmol/L (ref 0.0–2.0)
Acid-Base Excess: 4 mmol/L — ABNORMAL HIGH (ref 0.0–2.0)
Acid-base deficit: 1 mmol/L (ref 0.0–2.0)
Acid-base deficit: 3 mmol/L — ABNORMAL HIGH (ref 0.0–2.0)
BICARBONATE: 25.5 mmol/L (ref 20.0–28.0)
Bicarbonate: 28.8 mmol/L — ABNORMAL HIGH (ref 20.0–28.0)
Bicarbonate: 25.7 mmol/L (ref 20.0–28.0)
Bicarbonate: 24.7 mmol/L (ref 20.0–28.0)
Bicarbonate: 23.2 mmol/L (ref 20.0–28.0)
O2 SAT: 99 %
O2 SAT: 95 %
O2 SAT: 94 %
O2 Saturation: 100 %
O2 Saturation: 99 %
PCO2 ART: 44.9 mmHg (ref 32.0–48.0)
PCO2 ART: 43.8 mmHg (ref 32.0–48.0)
PCO2 ART: 44.7 mmHg (ref 32.0–48.0)
PCO2 ART: 44.7 mmHg (ref 32.0–48.0)
PH ART: 7.416 (ref 7.350–7.450)
PH ART: 7.376 (ref 7.350–7.450)
PO2 ART: 453 mmHg — AB (ref 83.0–108.0)
PO2 ART: 160 mmHg — AB (ref 83.0–108.0)
PO2 ART: 77 mmHg — AB (ref 83.0–108.0)
PO2 ART: 72 mmHg — AB (ref 83.0–108.0)
Patient temperature: 36.2
Patient temperature: 36
TCO2: 30 mmol/L (ref 0–100)
TCO2: 27 mmol/L (ref 0–100)
TCO2: 27 mmol/L (ref 0–100)
TCO2: 26 mmol/L (ref 0–100)
TCO2: 25 mmol/L (ref 0–100)
pCO2 arterial: 47.6 mmHg (ref 32.0–48.0)
pH, Arterial: 7.333 — ABNORMAL LOW (ref 7.350–7.450)
pH, Arterial: 7.346 — ABNORMAL LOW (ref 7.350–7.450)
pH, Arterial: 7.318 — ABNORMAL LOW (ref 7.350–7.450)
pO2, Arterial: 123 mmHg — ABNORMAL HIGH (ref 83.0–108.0)

## 2016-07-14 LAB — HEMOGLOBIN AND HEMATOCRIT, BLOOD
HEMATOCRIT: 29.1 % — AB (ref 39.0–52.0)
Hemoglobin: 9.5 g/dL — ABNORMAL LOW (ref 13.0–17.0)

## 2016-07-14 LAB — COMPREHENSIVE METABOLIC PANEL
ALT: 29 U/L (ref 17–63)
ANION GAP: 8 (ref 5–15)
AST: 66 U/L — AB (ref 15–41)
Albumin: 3.9 g/dL (ref 3.5–5.0)
Alkaline Phosphatase: 74 U/L (ref 38–126)
BUN: 13 mg/dL (ref 6–20)
CHLORIDE: 106 mmol/L (ref 101–111)
CO2: 25 mmol/L (ref 22–32)
Calcium: 9.3 mg/dL (ref 8.9–10.3)
Creatinine, Ser: 0.88 mg/dL (ref 0.61–1.24)
Glucose, Bld: 97 mg/dL (ref 65–99)
POTASSIUM: 3.9 mmol/L (ref 3.5–5.1)
Sodium: 139 mmol/L (ref 135–145)
Total Bilirubin: 0.8 mg/dL (ref 0.3–1.2)
Total Protein: 6.6 g/dL (ref 6.5–8.1)

## 2016-07-14 LAB — CBC WITH DIFFERENTIAL/PLATELET
Basophils Absolute: 0.1 10*3/uL (ref 0.0–0.1)
Basophils Relative: 0 %
Eosinophils Absolute: 0.4 10*3/uL (ref 0.0–0.7)
Eosinophils Relative: 4 %
HEMATOCRIT: 38 % — AB (ref 39.0–52.0)
HEMOGLOBIN: 12.7 g/dL — AB (ref 13.0–17.0)
LYMPHS ABS: 4.4 10*3/uL — AB (ref 0.7–4.0)
LYMPHS PCT: 39 %
MCH: 27.7 pg (ref 26.0–34.0)
MCHC: 33.4 g/dL (ref 30.0–36.0)
MCV: 83 fL (ref 78.0–100.0)
Monocytes Absolute: 0.5 10*3/uL (ref 0.1–1.0)
Monocytes Relative: 5 %
NEUTROS ABS: 6 10*3/uL (ref 1.7–7.7)
NEUTROS PCT: 52 %
Platelets: 186 10*3/uL (ref 150–400)
RBC: 4.58 MIL/uL (ref 4.22–5.81)
RDW: 13.2 % (ref 11.5–15.5)
WBC: 11.4 10*3/uL — AB (ref 4.0–10.5)

## 2016-07-14 LAB — CBC
HCT: 36.1 % — ABNORMAL LOW (ref 39.0–52.0)
HEMATOCRIT: 33.9 % — AB (ref 39.0–52.0)
HEMOGLOBIN: 11.4 g/dL — AB (ref 13.0–17.0)
Hemoglobin: 12.2 g/dL — ABNORMAL LOW (ref 13.0–17.0)
MCH: 27.9 pg (ref 26.0–34.0)
MCH: 27.8 pg (ref 26.0–34.0)
MCHC: 33.6 g/dL (ref 30.0–36.0)
MCHC: 33.8 g/dL (ref 30.0–36.0)
MCV: 82.9 fL (ref 78.0–100.0)
MCV: 82.2 fL (ref 78.0–100.0)
PLATELETS: 162 10*3/uL (ref 150–400)
PLATELETS: 196 10*3/uL (ref 150–400)
RBC: 4.09 MIL/uL — ABNORMAL LOW (ref 4.22–5.81)
RBC: 4.39 MIL/uL (ref 4.22–5.81)
RDW: 13.1 % (ref 11.5–15.5)
RDW: 12.8 % (ref 11.5–15.5)
WBC: 17.3 10*3/uL — ABNORMAL HIGH (ref 4.0–10.5)
WBC: 22.2 10*3/uL — ABNORMAL HIGH (ref 4.0–10.5)

## 2016-07-14 LAB — GLUCOSE, CAPILLARY
GLUCOSE-CAPILLARY: 110 mg/dL — AB (ref 65–99)
GLUCOSE-CAPILLARY: 119 mg/dL — AB (ref 65–99)
GLUCOSE-CAPILLARY: 134 mg/dL — AB (ref 65–99)
GLUCOSE-CAPILLARY: 158 mg/dL — AB (ref 65–99)
Glucose-Capillary: 114 mg/dL — ABNORMAL HIGH (ref 65–99)
Glucose-Capillary: 120 mg/dL — ABNORMAL HIGH (ref 65–99)
Glucose-Capillary: 121 mg/dL — ABNORMAL HIGH (ref 65–99)
Glucose-Capillary: 132 mg/dL — ABNORMAL HIGH (ref 65–99)

## 2016-07-14 LAB — POCT I-STAT 4, (NA,K, GLUC, HGB,HCT)
GLUCOSE: 114 mg/dL — AB (ref 65–99)
HEMATOCRIT: 32 % — AB (ref 39.0–52.0)
HEMOGLOBIN: 10.9 g/dL — AB (ref 13.0–17.0)
Potassium: 4 mmol/L (ref 3.5–5.1)
Sodium: 139 mmol/L (ref 135–145)

## 2016-07-14 LAB — VAS US DOPPLER PRE CABG
LCCADDIAS: -34 cm/s
LCCADSYS: -93 cm/s
LCCAPSYS: 71 cm/s
LEFT ECA DIAS: -16 cm/s
LEFT VERTEBRAL DIAS: -15 cm/s
LICADSYS: -78 cm/s
Left CCA prox dias: 25 cm/s
Left ICA dist dias: -33 cm/s
Left ICA prox dias: -36 cm/s
Left ICA prox sys: -89 cm/s
RCCAPDIAS: 15 cm/s
RCCAPSYS: 53 cm/s
RIGHT ECA DIAS: -14 cm/s
RIGHT VERTEBRAL DIAS: -10 cm/s
Right cca dist sys: -68 cm/s

## 2016-07-14 LAB — CREATININE, SERUM
CREATININE: 0.91 mg/dL (ref 0.61–1.24)
GFR calc Af Amer: >60 mL/min (ref >60)
GFR calc non Af Amer: >60 mL/min (ref >60)

## 2016-07-14 LAB — LIPID PANEL
CHOL/HDL RATIO: 4.4 ratio
CHOLESTEROL: 133 mg/dL (ref 0–200)
HDL: 30 mg/dL — ABNORMAL LOW (ref >40)
LDL Cholesterol: 72 mg/dL (ref 0–99)
Triglycerides: 157 mg/dL — ABNORMAL HIGH (ref <150)
VLDL: 31 mg/dL (ref 0–40)

## 2016-07-14 LAB — MAGNESIUM: MAGNESIUM: 2.8 mg/dL — AB (ref 1.7–2.4)

## 2016-07-14 LAB — TROPONIN I: Troponin I: 7.23 ng/mL (ref <0.03)

## 2016-07-14 LAB — SURGICAL PCR SCREEN
MRSA, PCR: NEGATIVE
Staphylococcus aureus: POSITIVE — AB

## 2016-07-14 LAB — PROTIME-INR
INR: 1.25
Prothrombin Time: 15.8 seconds — ABNORMAL HIGH (ref 11.4–15.2)

## 2016-07-14 LAB — PLATELET COUNT: Platelets: 184 10*3/uL (ref 150–400)

## 2016-07-14 LAB — APTT
aPTT: 113 seconds — ABNORMAL HIGH (ref 24–36)
aPTT: 32 seconds (ref 24–36)

## 2016-07-14 SURGERY — CORONARY ARTERY BYPASS GRAFTING (CABG)
Anesthesia: General | Site: Chest

## 2016-07-14 MED ORDER — ROCURONIUM BROMIDE 50 MG/5ML IV SOSY
PREFILLED_SYRINGE | INTRAVENOUS | Status: AC
  Filled 2016-07-14: qty 15

## 2016-07-14 MED ORDER — ACETAMINOPHEN 160 MG/5ML PO SOLN: 1000.0000 mg | Freq: Four times a day (QID) | ORAL | Status: DC

## 2016-07-14 MED ORDER — CHLORHEXIDINE GLUCONATE 0.12 % MT SOLN
15.0000 mL | OROMUCOSAL | Status: AC
  Administered 2016-07-14: 15 mL via OROMUCOSAL
  Filled 2016-07-14: qty 15

## 2016-07-14 MED ORDER — CHLORHEXIDINE GLUCONATE 0.12% ORAL RINSE (MEDLINE KIT): 15.0000 mL | Freq: Two times a day (BID) | OROMUCOSAL | Status: DC

## 2016-07-14 MED ORDER — ARTIFICIAL TEARS OP OINT
TOPICAL_OINTMENT | OPHTHALMIC | Status: DC | PRN
  Administered 2016-07-14: 1 via OPHTHALMIC

## 2016-07-14 MED ORDER — MUPIROCIN 2 % EX OINT
1.0000 "application " | TOPICAL_OINTMENT | Freq: Two times a day (BID) | CUTANEOUS | Status: DC
  Administered 2016-07-14 – 2016-07-16 (×5): 1 via NASAL
  Filled 2016-07-14: qty 22

## 2016-07-14 MED ORDER — LACTATED RINGERS IV SOLN
INTRAVENOUS | Status: DC | PRN
  Administered 2016-07-14: 07:00:00 via INTRAVENOUS

## 2016-07-14 MED ORDER — SODIUM CHLORIDE 0.9% FLUSH: 3.0000 mL | INTRAVENOUS | Status: DC | PRN

## 2016-07-14 MED ORDER — LACTATED RINGERS IV SOLN
INTRAVENOUS | Status: DC | PRN
  Administered 2016-07-14: 08:00:00 via INTRAVENOUS

## 2016-07-14 MED ORDER — PROTAMINE SULFATE 10 MG/ML IV SOLN
INTRAVENOUS | Status: DC | PRN
  Administered 2016-07-14: 250 mg via INTRAVENOUS

## 2016-07-14 MED ORDER — CHLORHEXIDINE GLUCONATE CLOTH 2 % EX PADS
6.0000 | MEDICATED_PAD | Freq: Every day | CUTANEOUS | Status: DC
  Administered 2016-07-14 – 2016-07-16 (×3): 6 via TOPICAL

## 2016-07-14 MED ORDER — LEVOFLOXACIN IN D5W 750 MG/150ML IV SOLN
750.0000 mg | INTRAVENOUS | Status: AC
  Administered 2016-07-15: 750 mg via INTRAVENOUS
  Filled 2016-07-14: qty 150

## 2016-07-14 MED ORDER — MORPHINE SULFATE (PF) 2 MG/ML IV SOLN
2.0000 mg | INTRAVENOUS | Status: DC | PRN
  Administered 2016-07-14 – 2016-07-15 (×3): 2 mg via INTRAVENOUS
  Administered 2016-07-16: 4 mg via INTRAVENOUS
  Filled 2016-07-14: qty 1
  Filled 2016-07-14: qty 3
  Filled 2016-07-14: qty 2
  Filled 2016-07-14 (×3): qty 1

## 2016-07-14 MED ORDER — DOCUSATE SODIUM 100 MG PO CAPS
200.0000 mg | ORAL_CAPSULE | Freq: Every day | ORAL | Status: DC
  Administered 2016-07-15 – 2016-07-16 (×2): 200 mg via ORAL
  Filled 2016-07-14 (×2): qty 2

## 2016-07-14 MED ORDER — LACTATED RINGERS IV SOLN
INTRAVENOUS | Status: DC
  Administered 2016-07-14: 13:00:00 via INTRAVENOUS

## 2016-07-14 MED ORDER — PLASMA-LYTE 148 IV SOLN
INTRAVENOUS | Status: DC | PRN
  Administered 2016-07-14: 500 mL via INTRAVASCULAR

## 2016-07-14 MED ORDER — BISACODYL 10 MG RE SUPP: 10.0000 mg | Freq: Every day | RECTAL | Status: DC

## 2016-07-14 MED ORDER — SODIUM CHLORIDE 0.9 % IV SOLN
INTRAVENOUS | Status: DC | PRN
  Administered 2016-07-14: 15 mg/kg/h via INTRAVENOUS

## 2016-07-14 MED ORDER — MORPHINE SULFATE (PF) 2 MG/ML IV SOLN
1.0000 mg | INTRAVENOUS | Status: AC | PRN
  Administered 2016-07-14: 1 mg via INTRAVENOUS
  Filled 2016-07-14: qty 1

## 2016-07-14 MED ORDER — PHENYLEPHRINE HCL 10 MG/ML IJ SOLN
0.0000 ug/min | INTRAMUSCULAR | Status: DC
  Filled 2016-07-14: qty 2

## 2016-07-14 MED ORDER — ROCURONIUM BROMIDE 10 MG/ML (PF) SYRINGE
PREFILLED_SYRINGE | INTRAVENOUS | Status: DC | PRN
  Administered 2016-07-14: 100 mg via INTRAVENOUS
  Administered 2016-07-14: 30 mg via INTRAVENOUS
  Administered 2016-07-14: 50 mg via INTRAVENOUS

## 2016-07-14 MED ORDER — LACTATED RINGERS IV SOLN: 500.0000 mL | Freq: Once | INTRAVENOUS | Status: DC | PRN

## 2016-07-14 MED ORDER — SODIUM CHLORIDE 0.9 % IV SOLN
INTRAVENOUS | Status: DC
  Administered 2016-07-14: 2 [IU]/h via INTRAVENOUS
  Administered 2016-07-14: 1 [IU]/h via INTRAVENOUS
  Filled 2016-07-14 (×2): qty 2.5

## 2016-07-14 MED ORDER — FENTANYL CITRATE (PF) 250 MCG/5ML IJ SOLN
INTRAMUSCULAR | Status: AC
  Filled 2016-07-14: qty 25

## 2016-07-14 MED ORDER — ROCURONIUM BROMIDE 50 MG/5ML IV SOSY
PREFILLED_SYRINGE | INTRAVENOUS | Status: AC
  Filled 2016-07-14: qty 10

## 2016-07-14 MED ORDER — ACETAMINOPHEN 500 MG PO TABS
1000.0000 mg | ORAL_TABLET | Freq: Four times a day (QID) | ORAL | Status: DC
  Administered 2016-07-14 – 2016-07-16 (×6): 1000 mg via ORAL
  Filled 2016-07-14 (×7): qty 2

## 2016-07-14 MED ORDER — INSULIN REGULAR BOLUS VIA INFUSION
0.0000 [IU] | Freq: Three times a day (TID) | INTRAVENOUS | Status: DC
  Filled 2016-07-14: qty 10

## 2016-07-14 MED ORDER — ORAL CARE MOUTH RINSE: 15.0000 mL | Freq: Four times a day (QID) | OROMUCOSAL | Status: DC

## 2016-07-14 MED ORDER — ONDANSETRON HCL 4 MG/2ML IJ SOLN
4.0000 mg | Freq: Four times a day (QID) | INTRAMUSCULAR | Status: DC | PRN
  Administered 2016-07-14 – 2016-07-16 (×5): 4 mg via INTRAVENOUS
  Filled 2016-07-14 (×6): qty 2

## 2016-07-14 MED ORDER — SODIUM CHLORIDE 0.9% FLUSH
3.0000 mL | Freq: Two times a day (BID) | INTRAVENOUS | Status: DC
  Administered 2016-07-15 – 2016-07-16 (×3): 3 mL via INTRAVENOUS

## 2016-07-14 MED ORDER — ALBUMIN HUMAN 5 % IV SOLN
INTRAVENOUS | Status: DC | PRN
  Administered 2016-07-14: 11:00:00 via INTRAVENOUS

## 2016-07-14 MED ORDER — SODIUM CHLORIDE 0.45 % IV SOLN
INTRAVENOUS | Status: DC | PRN
Start: 2016-07-14 — End: 2016-07-16
  Administered 2016-07-14: 13:00:00 via INTRAVENOUS

## 2016-07-14 MED ORDER — SODIUM CHLORIDE 0.9 % IV SOLN
INTRAVENOUS | Status: DC
  Administered 2016-07-14: 15:00:00 via INTRAVENOUS

## 2016-07-14 MED ORDER — BISACODYL 5 MG PO TBEC
10.0000 mg | DELAYED_RELEASE_TABLET | Freq: Every day | ORAL | Status: DC
  Administered 2016-07-15 – 2016-07-16 (×2): 10 mg via ORAL
  Filled 2016-07-14 (×2): qty 2

## 2016-07-14 MED ORDER — 0.9 % SODIUM CHLORIDE (POUR BTL) OPTIME
TOPICAL | Status: DC | PRN
  Administered 2016-07-14: 6000 mL

## 2016-07-14 MED ORDER — PANTOPRAZOLE SODIUM 40 MG PO TBEC
40.0000 mg | DELAYED_RELEASE_TABLET | Freq: Every day | ORAL | Status: DC
  Administered 2016-07-16: 40 mg via ORAL
  Filled 2016-07-14: qty 1

## 2016-07-14 MED ORDER — LIDOCAINE 2% (20 MG/ML) 5 ML SYRINGE
INTRAMUSCULAR | Status: DC | PRN
  Administered 2016-07-14: 100 mg via INTRAVENOUS

## 2016-07-14 MED ORDER — LACTATED RINGERS IV SOLN
INTRAVENOUS | Status: DC
  Administered 2016-07-14: 15:00:00 via INTRAVENOUS

## 2016-07-14 MED ORDER — METOPROLOL TARTRATE 25 MG/10 ML ORAL SUSPENSION: 12.5000 mg | Freq: Two times a day (BID) | ORAL | Status: DC

## 2016-07-14 MED ORDER — PROPOFOL 10 MG/ML IV BOLUS
INTRAVENOUS | Status: AC
  Filled 2016-07-14: qty 20

## 2016-07-14 MED ORDER — VANCOMYCIN HCL 1000 MG IV SOLR
INTRAVENOUS | Status: DC | PRN
  Administered 2016-07-14: 1250 mg via INTRAVENOUS

## 2016-07-14 MED ORDER — PHENYLEPHRINE HCL 10 MG/ML IJ SOLN
INTRAVENOUS | Status: DC | PRN
  Administered 2016-07-14: 10 ug/min via INTRAVENOUS

## 2016-07-14 MED ORDER — ASPIRIN EC 325 MG PO TBEC
325.0000 mg | DELAYED_RELEASE_TABLET | Freq: Every day | ORAL | Status: DC
  Administered 2016-07-15 – 2016-07-16 (×2): 325 mg via ORAL
  Filled 2016-07-14 (×2): qty 1

## 2016-07-14 MED ORDER — LIDOCAINE 2% (20 MG/ML) 5 ML SYRINGE
INTRAMUSCULAR | Status: AC
  Filled 2016-07-14: qty 10

## 2016-07-14 MED ORDER — ACETAMINOPHEN 160 MG/5ML PO SOLN: 650.0000 mg | Freq: Once | ORAL | Status: AC

## 2016-07-14 MED ORDER — VANCOMYCIN HCL IN DEXTROSE 1-5 GM/200ML-% IV SOLN
1000.0000 mg | Freq: Once | INTRAVENOUS | Status: AC
  Administered 2016-07-14: 1000 mg via INTRAVENOUS
  Filled 2016-07-14: qty 200

## 2016-07-14 MED ORDER — PROTAMINE SULFATE 10 MG/ML IV SOLN
INTRAVENOUS | Status: AC
  Filled 2016-07-14: qty 25

## 2016-07-14 MED ORDER — NITROGLYCERIN IN D5W 200-5 MCG/ML-% IV SOLN: 0.0000 ug/min | INTRAVENOUS | Status: DC

## 2016-07-14 MED ORDER — MAGNESIUM SULFATE 4 GM/100ML IV SOLN
4.0000 g | Freq: Once | INTRAVENOUS | Status: AC
  Administered 2016-07-14: 4 g via INTRAVENOUS
  Filled 2016-07-14: qty 100

## 2016-07-14 MED ORDER — SODIUM CHLORIDE 0.9 % IV SOLN: 250.0000 mL | INTRAVENOUS | Status: DC

## 2016-07-14 MED ORDER — ACETAMINOPHEN 650 MG RE SUPP
650.0000 mg | Freq: Once | RECTAL | Status: AC
  Administered 2016-07-14: 650 mg via RECTAL

## 2016-07-14 MED ORDER — PHENYLEPHRINE HCL 10 MG/ML IJ SOLN
INTRAMUSCULAR | Status: DC | PRN
  Administered 2016-07-14: 120 ug via INTRAVENOUS
  Administered 2016-07-14 (×3): 80 ug via INTRAVENOUS
  Administered 2016-07-14: 40 ug via INTRAVENOUS

## 2016-07-14 MED ORDER — HEPARIN SODIUM (PORCINE) 1000 UNIT/ML IJ SOLN
INTRAMUSCULAR | Status: AC
  Filled 2016-07-14: qty 1

## 2016-07-14 MED ORDER — SODIUM CHLORIDE 0.9 % IV SOLN
INTRAVENOUS | Status: DC | PRN
  Administered 2016-07-14: .9 [IU]/h via INTRAVENOUS

## 2016-07-14 MED ORDER — HEMOSTATIC AGENTS (NO CHARGE) OPTIME
TOPICAL | Status: DC | PRN
  Administered 2016-07-14: 1 via TOPICAL

## 2016-07-14 MED ORDER — TRAMADOL HCL 50 MG PO TABS
50.0000 mg | ORAL_TABLET | ORAL | Status: DC | PRN
  Administered 2016-07-16: 100 mg via ORAL
  Filled 2016-07-14: qty 2

## 2016-07-14 MED ORDER — ORAL CARE MOUTH RINSE
15.0000 mL | Freq: Two times a day (BID) | OROMUCOSAL | Status: DC
  Administered 2016-07-15 – 2016-07-17 (×3): 15 mL via OROMUCOSAL

## 2016-07-14 MED ORDER — ALBUMIN HUMAN 5 % IV SOLN: 250.0000 mL | INTRAVENOUS | Status: AC | PRN

## 2016-07-14 MED ORDER — HEPARIN SODIUM (PORCINE) 1000 UNIT/ML IJ SOLN
INTRAMUSCULAR | Status: DC | PRN
  Administered 2016-07-14: 5000 [IU] via INTRAVENOUS
  Administered 2016-07-14: 23000 [IU] via INTRAVENOUS

## 2016-07-14 MED ORDER — OXYCODONE HCL 5 MG PO TABS
5.0000 mg | ORAL_TABLET | ORAL | Status: DC | PRN
  Administered 2016-07-14: 5 mg via ORAL
  Administered 2016-07-15 (×2): 10 mg via ORAL
  Filled 2016-07-14: qty 2
  Filled 2016-07-14: qty 1
  Filled 2016-07-14: qty 2

## 2016-07-14 MED ORDER — LEVOFLOXACIN IN D5W 500 MG/100ML IV SOLN
INTRAVENOUS | Status: DC | PRN
  Administered 2016-07-14: 500 mg via INTRAVENOUS

## 2016-07-14 MED ORDER — MIDAZOLAM HCL 5 MG/5ML IJ SOLN
INTRAMUSCULAR | Status: DC | PRN
  Administered 2016-07-14 (×2): 2 mg via INTRAVENOUS
  Administered 2016-07-14 (×2): 3 mg via INTRAVENOUS

## 2016-07-14 MED ORDER — MIDAZOLAM HCL 10 MG/2ML IJ SOLN
INTRAMUSCULAR | Status: AC
  Filled 2016-07-14: qty 2

## 2016-07-14 MED ORDER — METOPROLOL TARTRATE 12.5 MG HALF TABLET
12.5000 mg | ORAL_TABLET | Freq: Two times a day (BID) | ORAL | Status: DC
  Administered 2016-07-15 – 2016-07-16 (×2): 12.5 mg via ORAL
  Filled 2016-07-14 (×3): qty 1

## 2016-07-14 MED ORDER — METOPROLOL TARTRATE 5 MG/5ML IV SOLN: 2.5000 mg | INTRAVENOUS | Status: DC | PRN

## 2016-07-14 MED ORDER — ASPIRIN 81 MG PO CHEW: 324.0000 mg | CHEWABLE_TABLET | Freq: Every day | ORAL | Status: DC

## 2016-07-14 MED ORDER — MIDAZOLAM HCL 2 MG/2ML IJ SOLN
2.0000 mg | INTRAMUSCULAR | Status: DC | PRN
Start: 2016-07-14 — End: 2016-07-16
  Administered 2016-07-14: 2 mg via INTRAVENOUS
  Filled 2016-07-14: qty 2

## 2016-07-14 MED ORDER — SODIUM CHLORIDE 0.9 % IJ SOLN
OROMUCOSAL | Status: DC | PRN
  Administered 2016-07-14 (×3): 4 mL via TOPICAL

## 2016-07-14 MED ORDER — POTASSIUM CHLORIDE 2 MEQ/ML IV SOLN
30.0000 meq | Freq: Once | INTRAVENOUS | Status: DC
  Filled 2016-07-14: qty 15

## 2016-07-14 MED ORDER — PHENYLEPHRINE 40 MCG/ML (10ML) SYRINGE FOR IV PUSH (FOR BLOOD PRESSURE SUPPORT)
PREFILLED_SYRINGE | INTRAVENOUS | Status: AC
  Filled 2016-07-14: qty 10

## 2016-07-14 MED ORDER — PROPOFOL 10 MG/ML IV BOLUS
INTRAVENOUS | Status: DC | PRN
  Administered 2016-07-14: 100 mg via INTRAVENOUS

## 2016-07-14 MED ORDER — ARTIFICIAL TEARS OP OINT
TOPICAL_OINTMENT | OPHTHALMIC | Status: AC
  Filled 2016-07-14: qty 3.5

## 2016-07-14 MED ORDER — FAMOTIDINE IN NACL 20-0.9 MG/50ML-% IV SOLN
20.0000 mg | Freq: Two times a day (BID) | INTRAVENOUS | Status: DC
  Administered 2016-07-14: 20 mg via INTRAVENOUS

## 2016-07-14 MED ORDER — DEXMEDETOMIDINE HCL IN NACL 200 MCG/50ML IV SOLN
0.0000 ug/kg/h | INTRAVENOUS | Status: DC
  Administered 2016-07-14: 0.3 ug/kg/h via INTRAVENOUS
  Filled 2016-07-14: qty 50

## 2016-07-14 MED ORDER — DEXMEDETOMIDINE HCL IN NACL 400 MCG/100ML IV SOLN
INTRAVENOUS | Status: DC | PRN
  Administered 2016-07-14: .3 ug/kg/h via INTRAVENOUS

## 2016-07-14 MED ORDER — FENTANYL CITRATE (PF) 250 MCG/5ML IJ SOLN
INTRAMUSCULAR | Status: DC | PRN
  Administered 2016-07-14: 50 ug via INTRAVENOUS
  Administered 2016-07-14: 200 ug via INTRAVENOUS
  Administered 2016-07-14: 50 ug via INTRAVENOUS
  Administered 2016-07-14: 150 ug via INTRAVENOUS
  Administered 2016-07-14 (×2): 50 ug via INTRAVENOUS
  Administered 2016-07-14: 100 ug via INTRAVENOUS
  Administered 2016-07-14: 150 ug via INTRAVENOUS
  Administered 2016-07-14: 50 ug via INTRAVENOUS
  Administered 2016-07-14: 100 ug via INTRAVENOUS
  Administered 2016-07-14 (×2): 150 ug via INTRAVENOUS

## 2016-07-14 MED FILL — Potassium Chloride Inj 2 mEq/ML: INTRAVENOUS | Qty: 40 | Status: AC

## 2016-07-14 MED FILL — Mannitol IV Soln 20%: INTRAVENOUS | Qty: 500 | Status: AC

## 2016-07-14 MED FILL — Sodium Chloride IV Soln 0.9%: INTRAVENOUS | Qty: 2000 | Status: AC

## 2016-07-14 MED FILL — Lidocaine HCl IV Inj 20 MG/ML: INTRAVENOUS | Qty: 5 | Status: AC

## 2016-07-14 MED FILL — Heparin Sodium (Porcine) Inj 1000 Unit/ML: INTRAMUSCULAR | Qty: 30 | Status: AC

## 2016-07-14 MED FILL — Heparin Sodium (Porcine) Inj 1000 Unit/ML: INTRAMUSCULAR | Qty: 10 | Status: AC

## 2016-07-14 MED FILL — Electrolyte-R (PH 7.4) Solution: INTRAVENOUS | Qty: 1000 | Status: AC

## 2016-07-14 MED FILL — Sodium Bicarbonate IV Soln 8.4%: INTRAVENOUS | Qty: 50 | Status: AC

## 2016-07-14 MED FILL — Magnesium Sulfate Inj 50%: INTRAMUSCULAR | Qty: 10 | Status: AC

## 2016-07-14 SURGICAL SUPPLY — 96 items
BAG DECANTER FOR FLEXI CONT (MISCELLANEOUS) ×4 IMPLANT
BANDAGE ACE 4X5 VEL STRL LF (GAUZE/BANDAGES/DRESSINGS) IMPLANT
BANDAGE ACE 6X5 VEL STRL LF (GAUZE/BANDAGES/DRESSINGS) IMPLANT
BASKET HEART  (ORDER IN 25'S) (MISCELLANEOUS) ×1
BASKET HEART (ORDER IN 25'S) (MISCELLANEOUS) ×1
BASKET HEART (ORDER IN 25S) (MISCELLANEOUS) ×2 IMPLANT
BIOPATCH RED 1 DISK 7.0 (GAUZE/BANDAGES/DRESSINGS) ×3 IMPLANT
BIOPATCH RED 1IN DISK 7.0MM (GAUZE/BANDAGES/DRESSINGS) ×1
BLADE STERNUM SYSTEM 6 (BLADE) ×4 IMPLANT
BNDG GAUZE ELAST 4 BULKY (GAUZE/BANDAGES/DRESSINGS) IMPLANT
CANISTER SUCTION 2500CC (MISCELLANEOUS) ×4 IMPLANT
CANNULA EZ GLIDE AORTIC 21FR (CANNULA) ×4 IMPLANT
CATH CPB KIT HENDRICKSON (MISCELLANEOUS) ×4 IMPLANT
CATH ROBINSON RED A/P 18FR (CATHETERS) ×8 IMPLANT
CATH THORACIC 36FR (CATHETERS) ×4 IMPLANT
CATH THORACIC 36FR RT ANG (CATHETERS) IMPLANT
CHLORAPREP W/TINT 10.5 ML (MISCELLANEOUS) ×4 IMPLANT
CLIP TI MEDIUM 24 (CLIP) IMPLANT
CLIP TI WIDE RED SMALL 24 (CLIP) ×8 IMPLANT
CONN ST 1/4X3/8  BEN (MISCELLANEOUS) ×4
CONN ST 1/4X3/8 BEN (MISCELLANEOUS) ×4 IMPLANT
CRADLE DONUT ADULT HEAD (MISCELLANEOUS) ×4 IMPLANT
DRAIN CHANNEL 32F RND 10.7 FF (WOUND CARE) ×8 IMPLANT
DRAPE CARDIOVASCULAR INCISE (DRAPES) ×2
DRAPE SLUSH/WARMER DISC (DRAPES) ×4 IMPLANT
DRAPE SRG 135X102X78XABS (DRAPES) ×2 IMPLANT
DRSG COVADERM 4X14 (GAUZE/BANDAGES/DRESSINGS) ×4 IMPLANT
ELECT REM PT RETURN 9FT ADLT (ELECTROSURGICAL) ×8
ELECTRODE REM PT RTRN 9FT ADLT (ELECTROSURGICAL) ×4 IMPLANT
FELT TEFLON 1X6 (MISCELLANEOUS) ×4 IMPLANT
GAUZE SPONGE 4X4 12PLY STRL (GAUZE/BANDAGES/DRESSINGS) IMPLANT
GLOVE BIOGEL PI IND STRL 6.5 (GLOVE) ×4 IMPLANT
GLOVE BIOGEL PI INDICATOR 6.5 (GLOVE) ×4
GLOVE SS BIOGEL STRL SZ 6 (GLOVE) ×2 IMPLANT
GLOVE SUPERSENSE BIOGEL SZ 6 (GLOVE) ×2
GLOVE SURG SIGNA 7.5 PF LTX (GLOVE) ×12 IMPLANT
GOWN STRL REUS W/ TWL LRG LVL3 (GOWN DISPOSABLE) ×14 IMPLANT
GOWN STRL REUS W/ TWL XL LVL3 (GOWN DISPOSABLE) ×4 IMPLANT
GOWN STRL REUS W/TWL LRG LVL3 (GOWN DISPOSABLE) ×14
GOWN STRL REUS W/TWL XL LVL3 (GOWN DISPOSABLE) ×4
HEMOSTAT POWDER SURGIFOAM 1G (HEMOSTASIS) ×12 IMPLANT
HEMOSTAT SURGICEL 2X14 (HEMOSTASIS) ×4 IMPLANT
INSERT FOGARTY XLG (MISCELLANEOUS) IMPLANT
KIT BASIN OR (CUSTOM PROCEDURE TRAY) ×4 IMPLANT
KIT ROOM TURNOVER OR (KITS) ×4 IMPLANT
KIT SUCTION CATH 14FR (SUCTIONS) ×8 IMPLANT
KIT VASOVIEW HEMOPRO VH 3000 (KITS) IMPLANT
MARKER GRAFT CORONARY BYPASS (MISCELLANEOUS) ×12 IMPLANT
NS IRRIG 1000ML POUR BTL (IV SOLUTION) ×24 IMPLANT
PACK OPEN HEART (CUSTOM PROCEDURE TRAY) ×4 IMPLANT
PAD ARMBOARD 7.5X6 YLW CONV (MISCELLANEOUS) ×8 IMPLANT
PAD ELECT DEFIB RADIOL ZOLL (MISCELLANEOUS) ×4 IMPLANT
PENCIL BUTTON HOLSTER BLD 10FT (ELECTRODE) ×4 IMPLANT
PUNCH AORTIC ROTATE 4.0MM (MISCELLANEOUS) IMPLANT
PUNCH AORTIC ROTATE 4.5MM 8IN (MISCELLANEOUS) IMPLANT
PUNCH AORTIC ROTATE 5MM 8IN (MISCELLANEOUS) IMPLANT
SET CARDIOPLEGIA MPS 5001102 (MISCELLANEOUS) ×4 IMPLANT
SPONGE GAUZE 4X4 12PLY STER LF (GAUZE/BANDAGES/DRESSINGS) ×4 IMPLANT
SUT BONE WAX W31G (SUTURE) ×4 IMPLANT
SUT ETHIBOND NAB MH 2-0 36IN (SUTURE) ×4 IMPLANT
SUT MNCRL AB 4-0 PS2 18 (SUTURE) IMPLANT
SUT PROLENE 3 0 SH DA (SUTURE) ×4 IMPLANT
SUT PROLENE 4 0 RB 1 (SUTURE)
SUT PROLENE 4 0 SH DA (SUTURE) IMPLANT
SUT PROLENE 4-0 RB1 .5 CRCL 36 (SUTURE) IMPLANT
SUT PROLENE 5 0 C 1 36 (SUTURE) ×12 IMPLANT
SUT PROLENE 6 0 C 1 30 (SUTURE) ×8 IMPLANT
SUT PROLENE 7 0 BV1 MDA (SUTURE) ×4 IMPLANT
SUT PROLENE 8 0 BV175 6 (SUTURE) ×8 IMPLANT
SUT SILK  1 MH (SUTURE) ×2
SUT SILK 1 MH (SUTURE) ×2 IMPLANT
SUT STEEL 6MS V (SUTURE) ×4 IMPLANT
SUT STEEL STERNAL CCS#1 18IN (SUTURE) IMPLANT
SUT STEEL SZ 6 DBL 3X14 BALL (SUTURE) ×4 IMPLANT
SUT TEM PAC WIRE 2 0 SH (SUTURE) ×8 IMPLANT
SUT VIC AB 1 CTX 36 (SUTURE) ×4
SUT VIC AB 1 CTX36XBRD ANBCTR (SUTURE) ×4 IMPLANT
SUT VIC AB 2-0 CT1 27 (SUTURE)
SUT VIC AB 2-0 CT1 TAPERPNT 27 (SUTURE) IMPLANT
SUT VIC AB 2-0 CTX 27 (SUTURE) IMPLANT
SUT VIC AB 3-0 SH 27 (SUTURE)
SUT VIC AB 3-0 SH 27X BRD (SUTURE) IMPLANT
SUT VIC AB 3-0 X1 27 (SUTURE) IMPLANT
SUT VICRYL 4-0 PS2 18IN ABS (SUTURE) IMPLANT
SUTURE E-PAK OPEN HEART (SUTURE) ×4 IMPLANT
SYSTEM SAHARA CHEST DRAIN ATS (WOUND CARE) ×4 IMPLANT
TAPE CLOTH SURG 4X10 WHT LF (GAUZE/BANDAGES/DRESSINGS) ×4 IMPLANT
TOWEL GREEN STERILE (TOWEL DISPOSABLE) ×3 IMPLANT
TOWEL GREEN STERILE FF (TOWEL DISPOSABLE) ×4 IMPLANT
TOWEL OR 17X24 6PK STRL BLUE (TOWEL DISPOSABLE) IMPLANT
TOWEL OR 17X26 10 PK STRL BLUE (TOWEL DISPOSABLE) IMPLANT
TRAY FOLEY IC TEMP SENS 16FR (CATHETERS) ×4 IMPLANT
TUBE FEEDING 8FR 16IN STR KANG (MISCELLANEOUS) ×4 IMPLANT
TUBING INSUFFLATION (TUBING) IMPLANT
UNDERPAD 30X30 (UNDERPADS AND DIAPERS) ×4 IMPLANT
WATER STERILE IRR 1000ML POUR (IV SOLUTION) ×8 IMPLANT

## 2016-07-14 NOTE — OR Nursing (Signed)
1222 Rolling call made to SICU.

## 2016-07-14 NOTE — Progress Notes (Signed)
  Echocardiogram Echocardiogram Transesophageal has been performed.  [name], Zachary Cuevas 07/14/2016, 10:17 AM

## 2016-07-14 NOTE — OR Nursing (Signed)
1151 Second call made to SICU.

## 2016-07-14 NOTE — H&P (View-Only) (Signed)
Reason for Consult:2 vessel CAD, s/p MI Referring Physician: Dr. Arida  Zachary Cuevas is an 50 y.o. male.  HPI: 50 yo man with a history of hyperlipidemia, hypertension, remote tobacco abuse and a strong FH of CAD who presents with a cc/o CP.  He was awakened from sleep around 4:30 this morning. He thought he had indigestion. He took Tums without relief. The pain was severe like someone squeezing his heart. He was diaphoretic and had nausea and vomited. He came to the ED and his pain resolved with NTG x 2. He ruled in for NSTEMI with a troponin of 3.05.  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 Attempts to pass a wire in the LAD were unsuccessful.   Past Medical History:  Diagnosis Date  . Allergy    hay fever  . Hypercholesteremia   . Hypertension     Past Surgical History:  Procedure Laterality Date  . FRACTURE SURGERY  2005   boxer fracture  of hands    Family History  Problem Relation Age of Onset  . Miscarriages / Stillbirths Mother   . CAD Mother 32  . Heart disease Maternal Grandfather   . Diabetes Paternal Grandfather   . Heart disease Paternal Grandfather   . Heart disease Father 48  . Diabetes Brother     Social History:  reports that he quit smoking about 4 years ago. He has never used smokeless tobacco. He reports that he uses drugs, including Marijuana. He reports that he does not drink alcohol.  Allergies:  Allergies  Allergen Reactions  . Penicillins Other (See Comments)    Childhood allergy    Medications:  Scheduled: . [START ON 07/15/2016] aspirin EC  81 mg Oral Daily  . atorvastatin  80 mg Oral q1800  . losartan  100 mg Oral Daily   And  . hydrochlorothiazide  12.5 mg Oral Daily  . metoprolol tartrate  25 mg Oral BID  . pantoprazole  40 mg Oral Daily  . [START ON 07/14/2016] sodium chloride flush  3 mL Intravenous Q12H    Results for orders placed or performed during the hospital encounter of 07/13/16 (from the  past 48 hour(s))  Basic metabolic panel     Status: Abnormal   Collection Time: 07/13/16  7:19 AM  Result Value Ref Range   Sodium 141 135 - 145 mmol/L   Potassium 3.6 3.5 - 5.1 mmol/L   Chloride 101 101 - 111 mmol/L   CO2 29 22 - 32 mmol/L   Glucose, Bld 173 (H) 65 - 99 mg/dL   BUN 20 6 - 20 mg/dL   Creatinine, Ser 1.16 0.61 - 1.24 mg/dL   Calcium 10.0 8.9 - 10.3 mg/dL   GFR calc non Af Amer >60 >60 mL/min   GFR calc Af Amer >60 >60 mL/min    Comment: (NOTE) The eGFR has been calculated using the CKD EPI equation. This calculation has not been validated in all clinical situations. eGFR's persistently <60 mL/min signify possible Chronic Kidney Disease.    Anion gap 11 5 - 15  CBC     Status: Abnormal   Collection Time: 07/13/16  7:19 AM  Result Value Ref Range   WBC 11.9 (H) 4.0 - 10.5 K/uL   RBC 5.29 4.22 - 5.81 MIL/uL   Hemoglobin 14.8 13.0 - 17.0 g/dL   HCT 44.0 39.0 - 52.0 %   MCV 83.2 78.0 - 100.0 fL   MCH 28.0   26.0 - 34.0 pg   MCHC 33.6 30.0 - 36.0 g/dL   RDW 13.2 11.5 - 15.5 %   Platelets 227 150 - 400 K/uL  Protime-INR     Status: None   Collection Time: 07/13/16  7:19 AM  Result Value Ref Range   Prothrombin Time 12.8 11.4 - 15.2 seconds   INR 0.96   I-stat troponin, ED     Status: None   Collection Time: 07/13/16  7:37 AM  Result Value Ref Range   Troponin i, poc 0.02 0.00 - 0.08 ng/mL   Comment 3            Comment: Due to the release kinetics of cTnI, a negative result within the first hours of the onset of symptoms does not rule out myocardial infarction with certainty. If myocardial infarction is still suspected, repeat the test at appropriate intervals.   I-stat troponin, ED     Status: Abnormal   Collection Time: 07/13/16 11:05 AM  Result Value Ref Range   Troponin i, poc 2.04 (HH) 0.00 - 0.08 ng/mL   Comment NOTIFIED PHYSICIAN    Comment 3            Comment: Due to the release kinetics of cTnI, a negative result within the first hours of the  onset of symptoms does not rule out myocardial infarction with certainty. If myocardial infarction is still suspected, repeat the test at appropriate intervals.   Troponin I (q 6hr x 3)     Status: Abnormal   Collection Time: 07/13/16 12:21 PM  Result Value Ref Range   Troponin I 3.05 (HH) <0.03 ng/mL    Comment: CRITICAL RESULT CALLED TO, READ BACK BY AND VERIFIED WITH: S.LAUDERMILK,RN 07/13/16 @ 1346 BY N.LIVINGSTON   POCT Activated clotting time     Status: None   Collection Time: 07/13/16  2:39 PM  Result Value Ref Range   Activated Clotting Time 268 seconds  MRSA PCR Screening     Status: None   Collection Time: 07/13/16  3:54 PM  Result Value Ref Range   MRSA by PCR NEGATIVE NEGATIVE    Comment:        The GeneXpert MRSA Assay (FDA approved for NASAL specimens only), is one component of a comprehensive MRSA colonization surveillance program. It is not intended to diagnose MRSA infection nor to guide or monitor treatment for MRSA infections.     Dg Chest 2 View  Result Date: 07/13/2016 CLINICAL DATA:  50-year-old male with chest pain since 0400 hours. Initial encounter. Former smoker. EXAM: CHEST  2 VIEW COMPARISON:  06/06/2013. FINDINGS: Large lung volumes, mildly increased since 2015. Mediastinal contours remain normal. Visualized tracheal air column is within normal limits. No pneumothorax, pulmonary edema, pleural effusion or confluent pulmonary opacity. No acute osseous abnormality identified. Negative visible bowel gas pattern. IMPRESSION: Pulmonary hyperinflation.  No acute cardiopulmonary abnormality. Electronically Signed   By: H  Hall M.D.   On: 07/13/2016 07:54    Review of Systems  Constitutional: Positive for diaphoresis. Negative for chills and fever.  Respiratory: Positive for shortness of breath.   Cardiovascular: Positive for chest pain.  Gastrointestinal: Positive for nausea and vomiting.  All other systems reviewed and are negative.  Blood pressure  (!) 145/107, pulse 80, temperature 97.8 F (36.6 C), temperature source Oral, resp. rate 19, height 6' 2" (1.88 m), weight 176 lb 8 oz (80.1 kg), SpO2 97 %. Physical Exam  Vitals reviewed. Constitutional: He is oriented to person, place, and   time. He appears well-developed and well-nourished. No distress.  HENT:  Head: Normocephalic and atraumatic.  Mouth/Throat: No oropharyngeal exudate.  Eyes: Conjunctivae and EOM are normal. No scleral icterus.  Neck: Neck supple. No thyromegaly present.  Cardiovascular: Normal rate, regular rhythm, normal heart sounds and intact distal pulses.  Exam reveals no gallop and no friction rub.   No murmur heard. Respiratory: Effort normal and breath sounds normal. No respiratory distress. He has no wheezes. He has no rales.  GI: Soft. He exhibits no distension. There is no tenderness.  Musculoskeletal: He exhibits no edema.  Lymphadenopathy:    He has no cervical adenopathy.  Neurological: He is alert and oriented to person, place, and time. No cranial nerve deficit.  Motor 5/5  Skin: Skin is warm and dry.    Assessment/Plan: 50 yo man with multiple CRF including a strong family history of premature CAD who presents with unstable CP and ruled in for a NSTEMI. At cath has severe 2 vessel CAD no amenable to PCA. CABG indicated for survival benefit and relief of symptoms.  I discussed the general nature of the procedure, the need for general anesthesia, the use of cardiopulmonary bypass and the incisions to be used with Mr. Pellum and his family. We discussed the expected hospital stay, overall recovery and short and long term outcomes. I informed him of the indications, risks, benefits and alternatives. He understands the risks include, but are not limited to death, stroke, MI, DVT/PE, bleeding, possible need for transfusion, infections, cardiac arrhythmias, as well as other organ system dysfunction including respiratory, renal, or GI complications.   He  accepts the risks and agreess to proceed.  For CABG in AM. Will plan to use bilateral IMA.  Maziyah Vessel C Angela Vazguez 07/13/2016, 6:45 PM     

## 2016-07-14 NOTE — Anesthesia Procedure Notes (Signed)
Central Venous Catheter Insertion Performed by: Eilene GhaziOSE, Algernon Mundie, anesthesiologist Start/End2/15/2018 7:03 AM, 07/14/2016 7:20 AM Patient location: Pre-op. Preanesthetic checklist: patient identified, IV checked, site marked, risks and benefits discussed, surgical consent, monitors and equipment checked, pre-op evaluation, timeout performed and anesthesia consent Position: Trendelenburg Lidocaine 1% used for infiltration Hand hygiene performed  and maximum sterile barriers used  Catheter size: 8.5 Fr PA cath was placed.Swan type:thermodilution PA Cath depth:45 Procedure performed using ultrasound guided technique. Ultrasound Notes:anatomy identified, needle tip was noted to be adjacent to the nerve/plexus identified, no ultrasound evidence of intravascular and/or intraneural injection and image(s) printed for medical record Attempts: 1 Following insertion, line sutured, dressing applied and Biopatch. Post procedure assessment: blood return through all ports, free fluid flow and no air  Patient tolerated the procedure well with no immediate complications.

## 2016-07-14 NOTE — Progress Notes (Signed)
RT note: rapid wean protocol initiated.   

## 2016-07-14 NOTE — Anesthesia Procedure Notes (Signed)
Procedure Name: Intubation Date/Time: 07/14/2016 8:08 AM Performed by: Geraldo DockerSOLHEIM, Nadie Fiumara SALOMON Pre-anesthesia Checklist: Patient identified, Patient being monitored, Timeout performed, Emergency Drugs available and Suction available Patient Re-evaluated:Patient Re-evaluated prior to inductionOxygen Delivery Method: Circle System Utilized Preoxygenation: Pre-oxygenation with 100% oxygen Intubation Type: IV induction Ventilation: Mask ventilation without difficulty Laryngoscope Size: Miller and 3 Grade View: Grade I Tube type: Oral Tube size: 8.0 mm Number of attempts: 1 Airway Equipment and Method: Stylet Placement Confirmation: ETT inserted through vocal cords under direct vision,  positive ETCO2 and breath sounds checked- equal and bilateral Secured at: 23 cm Tube secured with: Tape Dental Injury: Teeth and Oropharynx as per pre-operative assessment

## 2016-07-14 NOTE — Procedures (Signed)
Extubation Procedure Note  Patient Details:   Name: Zachary PinaDaniel Cuevas DOB: 1965-10-25 MRN: [id number]   Airway Documentation:     Evaluation  O2 sats: stable throughout Complications: No apparent complications Patient did tolerate procedure well. Bilateral Breath Sounds: Clear   Yes   Patient extubated to 3L nasal cannula.  Positive cuff leak noted.  No evidence of stridor.  NIF of -38, VC of 1L.  Patient able to speak post extubation.  Sats currently 95%.  Vitals are stable.  Incentive spirometry performed however patient still slightly lethargic and does not give good effort.  No complications noted.  Durwin GlazeBrown, Hilary Milks N 07/14/2016, 4:05 PM

## 2016-07-14 NOTE — Transfer of Care (Signed)
Immediate Anesthesia Transfer of Care Note  Patient: Zachary Cuevas  Procedure(s) Performed: Procedure(s): CORONARY ARTERY BYPASS GRAFTING (CABG) x 2 WITH BILATERAL IMA (N/A) TRANSESOPHAGEAL ECHOCARDIOGRAM (TEE) (N/A)  Patient Location: ICU  Anesthesia Type:General  Level of Consciousness: sedated and Patient remains intubated per anesthesia plan  Airway & Oxygen Therapy: Patient placed on Ventilator (see vital sign flow sheet for setting)  Post-op Assessment: Report given to RN and Post -op Vital signs reviewed and stable  Post vital signs: Reviewed and stable  Last Vitals:  Vitals:   07/14/16 0513 07/14/16 1235  BP: 112/90 122/76  Pulse: 77 76  Resp:  12  Temp:      Last Pain:  Vitals:   07/14/16 0000  TempSrc:   PainSc: Asleep         Complications: No apparent anesthesia complications

## 2016-07-14 NOTE — Progress Notes (Signed)
[facility] BRIEF SICU PROGRESS NOTE  Day of Surgery  S/P Procedure(s) (LRB): CORONARY ARTERY BYPASS GRAFTING (CABG) x 2 WITH BILATERAL IMA (N/A) TRANSESOPHAGEAL ECHOCARDIOGRAM (TEE) (N/A)   Extubated uneventfully NSR w/ stable hemodynamics Breathing comfortably w/ O2 sats 95% on 3 L/min Chest tube output low UOP > 100 mL/hr Labs okay  Plan: Continue routine early postop  Purcell Nailslarence H Owen, MD 07/14/2016 5:50 PM

## 2016-07-14 NOTE — Anesthesia Postprocedure Evaluation (Signed)
Anesthesia Post Note  Patient: Zachary PinaDaniel Mankey  Procedure(s) Performed: Procedure(s) (LRB): CORONARY ARTERY BYPASS GRAFTING (CABG) x 2 WITH BILATERAL IMA (N/A) TRANSESOPHAGEAL ECHOCARDIOGRAM (TEE) (N/A)  Patient location during evaluation: SICU Anesthesia Type: General Level of consciousness: sedated Pain management: pain level controlled Vital Signs Assessment: post-procedure vital signs reviewed and stable Respiratory status: patient remains intubated per anesthesia plan Cardiovascular status: stable Anesthetic complications: no       Last Vitals:  Vitals:   07/14/16 1700 07/14/16 1730  BP: 113/79   Pulse: 73 73  Resp: 19 20  Temp: (!) 36 C (!) 36 C    Last Pain:  Vitals:   07/14/16 0000  TempSrc:   PainSc: Asleep                 Cecile HearingStephen Edward Turk

## 2016-07-14 NOTE — Brief Op Note (Addendum)
07/13/2016 - 07/14/2016  11:03 AM  PATIENT:  Zachary Cuevas  51 y.o. male  PRE-OPERATIVE DIAGNOSIS:  1. S/p NSTEMI 2.CAD  POST-OPERATIVE DIAGNOSIS: 1. S/p NSTEMI 2.CAD  PROCEDURE:  TRANSESOPHAGEAL ECHOCARDIOGRAM (TEE), MEDIAN STERNOTOMY for CORONARY ARTERY BYPASS GRAFTING (CABG) x 2 WITH BILATERAL IMAs   SURGEON:  Surgeon(s) and Role:    * Loreli SlotSteven C Edris Friedt, MD - Primary  PHYSICIAN ASSISTANT: Doree Fudgeonielle Zimmerman PA-C and Tresa EndoKelly Rayburn PA-S  ANESTHESIA:   general  EBL:  Total I/O In: 1150 [I.V.:1150] Out: 300 [Urine:300]  DRAINS: Chest tubes placed in the mediastinal and pleural spaces   COUNTS CORRECT:  YES  DICTATION: .Dragon Dictation  PLAN OF CARE: Admit to inpatient   PATIENT DISPOSITION:  ICU - intubated and hemodynamically stable.   Delay start of Pharmacological VTE agent (>24hrs) due to surgical blood loss or risk of bleeding: yes  BASELINE WEIGHT: 78 kg  XC= 35 min CPB= 69 min  Good targets and conduits

## 2016-07-14 NOTE — OR Nursing (Signed)
1123 First call made to SICU.

## 2016-07-14 NOTE — Interval H&P Note (Signed)
History and Physical Interval Note:  07/14/2016 7:40 AM  Zachary Cuevas  has presented today for surgery, with the diagnosis of CAD  The various methods of treatment have been discussed with the patient and family. After consideration of risks, benefits and other options for treatment, the patient has consented to  Procedure(s): CORONARY ARTERY BYPASS GRAFTING (CABG) WITH BILATERAL IMA (N/A) TRANSESOPHAGEAL ECHOCARDIOGRAM (TEE) (N/A) as a surgical intervention .  The patient's history has been reviewed, patient examined, no change in status, stable for surgery.  I have reviewed the patient's chart and labs.  Questions were answered to the patient's satisfaction.     Loreli SlotSteven C Rilyn Upshaw

## 2016-07-14 NOTE — Interval H&P Note (Signed)
History and Physical Interval Note:  07/14/2016 7:39 AM  Zachary Cuevas  has presented today for surgery, with the diagnosis of CAD  The various methods of treatment have been discussed with the patient and family. After consideration of risks, benefits and other options for treatment, the patient has consented to  Procedure(s): CORONARY ARTERY BYPASS GRAFTING (CABG) WITH BILATERAL IMA (N/A) TRANSESOPHAGEAL ECHOCARDIOGRAM (TEE) (N/A) as a surgical intervention .  The patient's history has been reviewed, patient examined, no change in status, stable for surgery.  I have reviewed the patient's chart and labs.  Questions were answered to the patient's satisfaction.     Loreli SlotSteven C Morad Tal

## 2016-07-15 ENCOUNTER — Inpatient Hospital Stay (HOSPITAL_COMMUNITY): Payer: BLUE CROSS/BLUE SHIELD

## 2016-07-15 ENCOUNTER — Encounter (HOSPITAL_COMMUNITY): Payer: Self-pay | Admitting: Thoracic Surgery (Cardiothoracic Vascular Surgery)

## 2016-07-15 LAB — GLUCOSE, CAPILLARY
GLUCOSE-CAPILLARY: 105 mg/dL — AB (ref 65–99)
GLUCOSE-CAPILLARY: 106 mg/dL — AB (ref 65–99)
GLUCOSE-CAPILLARY: 106 mg/dL — AB (ref 65–99)
GLUCOSE-CAPILLARY: 116 mg/dL — AB (ref 65–99)
GLUCOSE-CAPILLARY: 131 mg/dL — AB (ref 65–99)
Glucose-Capillary: 134 mg/dL — ABNORMAL HIGH (ref 65–99)
Glucose-Capillary: 125 mg/dL — ABNORMAL HIGH (ref 65–99)
Glucose-Capillary: 107 mg/dL — ABNORMAL HIGH (ref 65–99)
Glucose-Capillary: 117 mg/dL — ABNORMAL HIGH (ref 65–99)
Glucose-Capillary: 117 mg/dL — ABNORMAL HIGH (ref 65–99)
Glucose-Capillary: 123 mg/dL — ABNORMAL HIGH (ref 65–99)

## 2016-07-15 LAB — BASIC METABOLIC PANEL
Anion gap: 10 (ref 5–15)
BUN: 14 mg/dL (ref 6–20)
CALCIUM: 8.4 mg/dL — AB (ref 8.9–10.3)
CO2: 22 mmol/L (ref 22–32)
Chloride: 105 mmol/L (ref 101–111)
Creatinine, Ser: 0.93 mg/dL (ref 0.61–1.24)
GFR calc Af Amer: >60 mL/min (ref >60)
GLUCOSE: 113 mg/dL — AB (ref 65–99)
POTASSIUM: 3.8 mmol/L (ref 3.5–5.1)
SODIUM: 137 mmol/L (ref 135–145)

## 2016-07-15 LAB — CREATININE, SERUM
CREATININE: 0.99 mg/dL (ref 0.61–1.24)
GFR calc non Af Amer: >60 mL/min (ref >60)

## 2016-07-15 LAB — CBC
HCT: 32.1 % — ABNORMAL LOW (ref 39.0–52.0)
HCT: 31 % — ABNORMAL LOW (ref 39.0–52.0)
Hemoglobin: 10.9 g/dL — ABNORMAL LOW (ref 13.0–17.0)
Hemoglobin: 10.6 g/dL — ABNORMAL LOW (ref 13.0–17.0)
MCH: 27.9 pg (ref 26.0–34.0)
MCH: 28.2 pg (ref 26.0–34.0)
MCHC: 34 g/dL (ref 30.0–36.0)
MCHC: 34.2 g/dL (ref 30.0–36.0)
MCV: 82.3 fL (ref 78.0–100.0)
MCV: 82.4 fL (ref 78.0–100.0)
PLATELETS: 147 10*3/uL — AB (ref 150–400)
PLATELETS: 140 10*3/uL — AB (ref 150–400)
RBC: 3.9 MIL/uL — AB (ref 4.22–5.81)
RBC: 3.76 MIL/uL — ABNORMAL LOW (ref 4.22–5.81)
RDW: 13.4 % (ref 11.5–15.5)
RDW: 13.2 % (ref 11.5–15.5)
WBC: 14.1 10*3/uL — AB (ref 4.0–10.5)
WBC: 13.9 10*3/uL — AB (ref 4.0–10.5)

## 2016-07-15 LAB — HEMOGLOBIN A1C
HEMOGLOBIN A1C: 5.7 % — AB (ref 4.8–5.6)
Mean Plasma Glucose: 117 mg/dL

## 2016-07-15 LAB — POCT I-STAT, CHEM 8
BUN: 19 mg/dL (ref 6–20)
Calcium, Ion: 1.19 mmol/L (ref 1.15–1.40)
Chloride: 100 mmol/L — ABNORMAL LOW (ref 101–111)
Creatinine, Ser: 1 mg/dL (ref 0.61–1.24)
GLUCOSE: 110 mg/dL — AB (ref 65–99)
HEMATOCRIT: 28 % — AB (ref 39.0–52.0)
HEMOGLOBIN: 9.5 g/dL — AB (ref 13.0–17.0)
POTASSIUM: 3.7 mmol/L (ref 3.5–5.1)
SODIUM: 138 mmol/L (ref 135–145)
TCO2: 28 mmol/L (ref 0–100)

## 2016-07-15 LAB — MAGNESIUM
MAGNESIUM: 2.3 mg/dL (ref 1.7–2.4)
Magnesium: 2.4 mg/dL (ref 1.7–2.4)

## 2016-07-15 MED ORDER — KETOROLAC TROMETHAMINE 30 MG/ML IJ SOLN
30.0000 mg | Freq: Four times a day (QID) | INTRAMUSCULAR | Status: AC
  Administered 2016-07-15 (×4): 30 mg via INTRAVENOUS
  Filled 2016-07-15 (×4): qty 1

## 2016-07-15 MED ORDER — SODIUM CHLORIDE 0.9 % IV SOLN
30.0000 meq | Freq: Once | INTRAVENOUS | Status: AC
  Administered 2016-07-15: 30 meq via INTRAVENOUS
  Filled 2016-07-15: qty 15

## 2016-07-15 MED ORDER — INSULIN ASPART 100 UNIT/ML ~~LOC~~ SOLN
0.0000 [IU] | SUBCUTANEOUS | Status: DC
  Administered 2016-07-15: 2 [IU] via SUBCUTANEOUS

## 2016-07-15 MED ORDER — ENOXAPARIN SODIUM 40 MG/0.4ML ~~LOC~~ SOLN
40.0000 mg | Freq: Every day | SUBCUTANEOUS | Status: DC
  Administered 2016-07-15 – 2016-07-17 (×3): 40 mg via SUBCUTANEOUS
  Filled 2016-07-15 (×3): qty 0.4

## 2016-07-15 MED ORDER — INSULIN ASPART 100 UNIT/ML ~~LOC~~ SOLN: 0.0000 [IU] | SUBCUTANEOUS | Status: DC

## 2016-07-15 NOTE — Progress Notes (Signed)
Patient ID: Zachary PinaDaniel Levitz, male   DOB: [birth year], 51 y.o.   MRN: [id number]  SICU Evening Rounds:   Hemodynamically stable   Urine output good    CBC    Component Value Date/Time   WBC 13.9 (H) 07/15/2016 1703   RBC 3.76 (L) 07/15/2016 1703   HGB 9.5 (L) 07/15/2016 1706   HCT 28.0 (L) 07/15/2016 1706   PLT 140 (L) 07/15/2016 1703   MCV 82.4 07/15/2016 1703   MCH 28.2 07/15/2016 1703   MCHC 34.2 07/15/2016 1703   RDW 13.2 07/15/2016 1703   LYMPHSABS 4.4 (H) 07/14/2016 0046   MONOABS 0.5 07/14/2016 0046   EOSABS 0.4 07/14/2016 0046   BASOSABS 0.1 07/14/2016 0046     BMET    Component Value Date/Time   NA 138 07/15/2016 1706   K 3.7 07/15/2016 1706   CL 100 (L) 07/15/2016 1706   CO2 22 07/15/2016 0400   GLUCOSE 110 (H) 07/15/2016 1706   BUN 19 07/15/2016 1706   CREATININE 1.00 07/15/2016 1706   CREATININE 0.99 03/25/2016 0821   CALCIUM 8.4 (L) 07/15/2016 0400   GFRNONAA >60 07/15/2016 0400   GFRNONAA >89 03/25/2016 0821   GFRAA >60 07/15/2016 0400   GFRAA >89 03/25/2016 0821     A/P:  Stable postop course. Continue current plans

## 2016-07-15 NOTE — Progress Notes (Signed)
1 Day Post-Op Procedure(s) (LRB): CORONARY ARTERY BYPASS GRAFTING (CABG) x 2 WITH BILATERAL IMA (N/A) TRANSESOPHAGEAL ECHOCARDIOGRAM (TEE) (N/A) Subjective: C/o incisional pain, nausea after receiving pain meds  Objective: Vital signs in last 24 hours: Temp:  [96.6 F (35.9 C)-99.3 F (37.4 C)] 99.1 F (37.3 C) (02/16 0700) Pulse Rate:  [73-97] 92 (02/16 0700) Cardiac Rhythm: Normal sinus rhythm (02/16 0730) Resp:  [12-33] 21 (02/16 0700) BP: (84-122)/(61-93) 98/72 (02/16 0700) SpO2:  [94 %-99 %] 97 % (02/16 0700) Arterial Line BP: (88-137)/(55-88) 121/64 (02/16 0700) FiO2 (%):  [40 %-50 %] 40 % (02/15 1530) Weight:  [179 lb 10.8 oz (81.5 kg)] 179 lb 10.8 oz (81.5 kg) (02/16 0500)  Hemodynamic parameters for last 24 hours: PAP: (15-30)/(6-19) 19/12 CO:  [3.9 L/min-6.5 L/min] 5.9 L/min CI:  [1.9 L/min/m2-3.2 L/min/m2] 2.9 L/min/m2  Intake/Output from previous day: 02/15 0701 - 02/16 0700 In: 4079.8 [P.O.:150; I.V.:2975.8; Blood:274; NG/GT:30; IV Piggyback:550] Out: 3410 [Urine:2170; Emesis/NG output:100; Blood:700; Chest Tube:440] Intake/Output this shift: No intake/output data recorded.  General appearance: alert, cooperative and no distress Neurologic: intact Heart: regular rate and rhythm Lungs: diminished breath sounds bibasilar Abdomen: soft, hypoactve BS  Lab Results:  Recent Labs  07/14/16 1830 07/14/16 1854 07/15/16 0400  WBC 22.2*  --  14.1*  HGB 12.2* 12.2* 10.9*  HCT 36.1* 36.0* 32.1*  PLT 196  --  147*   BMET:  Recent Labs  07/14/16 0046  07/14/16 1854 07/15/16 0400  NA 139  < > 141 137  K 3.9  < > 4.1 3.8  CL 106  < > 104 105  CO2 25  --   --  22  GLUCOSE 97  < > 128* 113*  BUN 13  < > 16 14  CREATININE 0.88  < > 0.90 0.93  CALCIUM 9.3  --   --  8.4*  < > = values in this interval not displayed.  PT/INR:  Recent Labs  07/14/16 1237  LABPROT 15.8*  INR 1.25   ABG    Component Value Date/Time   PHART 7.318 (L) 07/14/2016 1701   HCO3 23.2 07/14/2016 1701   TCO2 24 07/14/2016 1854   ACIDBASEDEF 3.0 (H) 07/14/2016 1701   O2SAT 94.0 07/14/2016 1701   CBG (last 3)   Recent Labs  07/15/16 0107 07/15/16 0402 07/15/16 0717  GLUCAP 106* 117* 116*    Assessment/Plan: S/P Procedure(s) (LRB): CORONARY ARTERY BYPASS GRAFTING (CABG) x 2 WITH BILATERAL IMA (N/A) TRANSESOPHAGEAL ECHOCARDIOGRAM (TEE) (N/A) -  CV- good hemodymanics- dc swan and A line  ASA, beta blocker, statin  RESP- IS  RENAL- creatinine OK, mildly volume overloaded, lytes OK  ENDO_ CBG normal- Q4 SSI  DVT prophylaxis- SCD in place, add enoxaparin  Mobilize  Possibly transfer to step down later today   LOS: 2 days    Loreli SlotSteven C Aaleyah Witherow 07/15/2016

## 2016-07-15 NOTE — Op Note (Signed)
Zachary Cuevas, Zachary Cuevas               ACCOUNT NO.:  [id number]  MEDICAL RECORD NO.:  [id number]  LOCATION:  [id number]                       FACILITY:  [facility]  PHYSICIAN:  Salvatore Decent. Dorris Fetch, M.D.DATE OF BIRTH:  03-Jan-1966  DATE OF PROCEDURE:  07/14/2016 DATE OF DISCHARGE:                              OPERATIVE REPORT   PREOPERATIVE DIAGNOSES:  Two-vessel coronary disease, status post non-ST elevation myocardial infarction.  POSTOPERATIVE DIAGNOSES:  Two-vessel coronary disease, status post non- ST elevation myocardial infarction.  PROCEDURE:  Median sternotomy, extracorporeal circulation, Coronary artery bypass grafting x2  Left internal mammary artery to LAD  Right internal mammary artery to distal right coronary  SURGEON:  Salvatore Decent. Dorris Fetch, M.D.  FIRST ASSISTANT:  Doree Fudge, PA.  SECOND ASSISTANT:  Wells Guiles, PA student.  ANESTHESIA:  General.  FINDINGS:  Good quality targets, good quality conduits.  Transesophageal echocardiography showed some anterior and inferior hypokinesis.  No significant valvular pathology.  CLINICAL NOTE:  Zachary Cuevas is a 51 year old gentleman with multiple cardiac risk factors including a strong family history, who presented with an unstable coronary syndrome.  He ruled in for a non-ST elevation MI.  At catheterization, he had severe two-vessel coronary disease not amenable to percutaneous intervention.  He was referred for coronary artery bypass grafting.  He was advised to undergo coronary bypass grafting for survival benefit, relief of symptoms, and myocardial preservation.  The indications, risks, benefits, and alternatives were discussed in detail with the patient.  He understood and accepted the risks and agreed to proceed.  OPERATIVE NOTE:  Zachary Cuevas was brought to the preoperative holding area on July 14, 2016, Anesthesia placed a Swan-Ganz catheter and an arterial blood pressure monitoring line.  He was taken to  the operating room, anesthetized, and intubated.  Transesophageal echocardiography was performed.  Please refer the separately dictated note for full details of that procedure.  There was anterior and inferior hypokinesis.  No significant valvular pathology.  Intravenous antibiotics were administered.  A Foley catheter was placed.  The chest, abdomen, and legs were prepped and draped in usual sterile fashion.  A median sternotomy was performed.  The left internal mammary artery was harvested using standard technique.  Care was taken to identify the phrenic nerve and avoid coming in proximity with that structure. 5000 units of heparin was administered prior to dividing the distal end of the mammary.  There was excellent flow through the cut end of the vessel.  The right internal mammary artery then was harvested in similar fashion. Again, the phrenic nerve was identified and preserved with no cautery used in the vicinity.  The right mammary also was a good quality vessel with excellent flow when divided distally.  It also had excellent length.  The remainder of the full heparin dose was given.  A sternal retractor was placed.  The pericardium was opened.  The ascending aorta was inspected.  There was no evidence of atherosclerotic disease.  After confirming adequate anticoagulation with ACT measurement, the ascending aorta was cannulated via concentric 2-0 Ethibond pledgeted pursestring sutures. A dual stage venous cannula was placed via a pursestring suture in the right atrial appendage. Cardiopulmonary bypass was initiated.  Flows were maintained  per protocol.  The patient was cooled to 34 degrees Celsius.  The coronary arteries were inspected and anastomotic sites were chosen.  The conduits were inspected.  It was determined that the right mammary would reach to the distal right coronary without undue tension and that the left mammary would reach to the LAD.  A foam pad was placed in  the pericardium to insulate the heart.  A temperature probe was placed in the myocardial septum, and a cardioplegia cannula was placed in the ascending aorta.  The aorta was crossclamped.  The left ventricle was emptied via the aortic root vent.  Cardiac arrest then was achieved with combination of cold antegrade blood cardioplegia and topical iced saline. 1.5 L of cardioplegia was administered, there was rapid diastolic arrest and septal cooling to 12 degrees Celsius.  The right internal mammary artery was brought through an incision in the pericardium.  The distal end was beveled, it was anastomosed end-to-side to the distal right coronary.  The distal right coronary was a 2-mm good quality target.  The right mammary was 1.5 mm good quality conduit.  An end-to-side anastomosis was performed with a running 8-0 Prolene suture. At the completion of the anastomosis, the bulldog clamp was removed. There was good hemostasis.  The bulldog clamp was replaced.  The mammary pedicle was tacked to the epicardial surface of the heart with 6-0 Prolene sutures.  The left internal mammary artery was brought through a window in the pericardium.  The distal end was beveled, it was anastomosed end-to-side to the distal LAD.  The LAD was a 2-mm good quality target.  The mammary was a 2-mm good quality conduit.  The end-to-side anastomosis was performed with a running 8-0 Prolene suture.  At the completion of the anastomosis, the bulldog clamp was removed and rapid septal rewarming was noted.  Lidocaine was administered.  The RIMA clamp was removed.  The aortic crossclamp was removed.  The total crossclamp time was 35 minutes.  The patient required a single defibrillation with 10 joules and then was in sinus rhythm thereafter.  While rewarming was completed, all proximal and distal anastomoses were inspected for hemostasis.  Epicardial pacing wires were placed on the right ventricle and right atrium.   When the patient had rewarmed to a core temperature of 37 degrees Celsius, he was weaned from cardiopulmonary bypass on the first attempt in sinus rhythm and without inotropic support.  Total bypass time was 69 minutes.  Post bypass transesophageal echocardiography revealed some slight improvement in the wall motion, particularly in the inferior wall.  The initial cardiac index was greater than 2 L/min/sq m, and the patient remained hemodynamically stable throughout the post bypass period.  A test dose of protamine was administered and was well tolerated.  The atrial and aortic cannulae were removed. The remainder of the protamine was administered without incident.  The chest was irrigated with warm saline.  Hemostasis was achieved.  The pericardium was reapproximated over the heart with interrupted 3-0 silk sutures, it came together easily without tension or kinking of the underlying grafts.  Blake drains were placed in both pleural spaces, and a chest tube was placed in the anterior mediastinum.  The sternum was closed with a combination of single and double heavy gauge stainless steel wires. The pectoralis fascia, subcutaneous tissue, and skin were closed in standard fashion.  All sponge, needle, and instrument counts were correct at the end of the procedure.  The patient was taken from the operating  room to the Surgical Intensive Care Unit, intubated and in good condition.     Salvatore Decent Dorris Fetch, M.D.     SCH/MEDQ  D:  07/14/2016  T:  07/15/2016  Job:  [id number]

## 2016-07-15 NOTE — Progress Notes (Signed)
Pt c/o sudden nausea and pain of 7/10 across his chest and stating that something 'isnt right' PRN zofran and morphine given, EKG done, charge aware. vital signs stable and patient gradually improving after PRNs. Will continue to monitor.

## 2016-07-16 ENCOUNTER — Inpatient Hospital Stay (HOSPITAL_COMMUNITY): Payer: BLUE CROSS/BLUE SHIELD

## 2016-07-16 LAB — BASIC METABOLIC PANEL
Anion gap: 8 (ref 5–15)
BUN: 18 mg/dL (ref 6–20)
CHLORIDE: 104 mmol/L (ref 101–111)
CO2: 26 mmol/L (ref 22–32)
CREATININE: 0.9 mg/dL (ref 0.61–1.24)
Calcium: 8.6 mg/dL — ABNORMAL LOW (ref 8.9–10.3)
GFR calc Af Amer: >60 mL/min (ref >60)
Glucose, Bld: 109 mg/dL — ABNORMAL HIGH (ref 65–99)
POTASSIUM: 4 mmol/L (ref 3.5–5.1)
SODIUM: 138 mmol/L (ref 135–145)

## 2016-07-16 LAB — CBC
HEMATOCRIT: 30.3 % — AB (ref 39.0–52.0)
Hemoglobin: 10.1 g/dL — ABNORMAL LOW (ref 13.0–17.0)
MCH: 27.9 pg (ref 26.0–34.0)
MCHC: 33.3 g/dL (ref 30.0–36.0)
MCV: 83.7 fL (ref 78.0–100.0)
PLATELETS: 125 10*3/uL — AB (ref 150–400)
RBC: 3.62 MIL/uL — AB (ref 4.22–5.81)
RDW: 13.6 % (ref 11.5–15.5)
WBC: 12.6 10*3/uL — AB (ref 4.0–10.5)

## 2016-07-16 LAB — GLUCOSE, CAPILLARY
Glucose-Capillary: 83 mg/dL (ref 65–99)
Glucose-Capillary: 100 mg/dL — ABNORMAL HIGH (ref 65–99)

## 2016-07-16 MED ORDER — MOVING RIGHT ALONG BOOK
Freq: Once | Status: DC
  Filled 2016-07-16: qty 1

## 2016-07-16 MED ORDER — PANTOPRAZOLE SODIUM 40 MG PO TBEC
40.0000 mg | DELAYED_RELEASE_TABLET | Freq: Every day | ORAL | Status: DC
  Administered 2016-07-18: 40 mg via ORAL
  Filled 2016-07-16 (×2): qty 1

## 2016-07-16 MED ORDER — SODIUM CHLORIDE 0.9% FLUSH
3.0000 mL | Freq: Two times a day (BID) | INTRAVENOUS | Status: DC
  Administered 2016-07-16 – 2016-07-17 (×3): 3 mL via INTRAVENOUS

## 2016-07-16 MED ORDER — FUROSEMIDE 40 MG PO TABS: 40.0000 mg | ORAL_TABLET | Freq: Every day | ORAL | Status: DC

## 2016-07-16 MED ORDER — SODIUM CHLORIDE 0.9% FLUSH: 3.0000 mL | INTRAVENOUS | Status: DC | PRN

## 2016-07-16 MED ORDER — METOPROLOL TARTRATE 12.5 MG HALF TABLET
12.5000 mg | ORAL_TABLET | Freq: Two times a day (BID) | ORAL | Status: DC
  Administered 2016-07-16 – 2016-07-17 (×3): 12.5 mg via ORAL
  Filled 2016-07-16 (×3): qty 1

## 2016-07-16 MED ORDER — ONDANSETRON HCL 4 MG/2ML IJ SOLN: 4.0000 mg | Freq: Four times a day (QID) | INTRAMUSCULAR | Status: DC | PRN

## 2016-07-16 MED ORDER — POTASSIUM CHLORIDE CRYS ER 20 MEQ PO TBCR
20.0000 meq | EXTENDED_RELEASE_TABLET | Freq: Two times a day (BID) | ORAL | Status: DC
  Administered 2016-07-16: 20 meq via ORAL
  Filled 2016-07-16: qty 1

## 2016-07-16 MED ORDER — BISACODYL 10 MG RE SUPP: 10.0000 mg | Freq: Every day | RECTAL | Status: DC | PRN

## 2016-07-16 MED ORDER — OXYCODONE HCL 5 MG PO TABS: 5.0000 mg | ORAL_TABLET | ORAL | Status: DC | PRN

## 2016-07-16 MED ORDER — ASPIRIN EC 325 MG PO TBEC
325.0000 mg | DELAYED_RELEASE_TABLET | Freq: Every day | ORAL | Status: DC
  Administered 2016-07-17 – 2016-07-18 (×2): 325 mg via ORAL
  Filled 2016-07-16 (×2): qty 1

## 2016-07-16 MED ORDER — ONDANSETRON HCL 4 MG PO TABS
4.0000 mg | ORAL_TABLET | Freq: Four times a day (QID) | ORAL | Status: DC | PRN
Start: 2016-07-16 — End: 2016-07-18

## 2016-07-16 MED ORDER — TRAMADOL HCL 50 MG PO TABS
50.0000 mg | ORAL_TABLET | ORAL | Status: DC | PRN
  Administered 2016-07-16: 50 mg via ORAL
  Administered 2016-07-17 – 2016-07-18 (×4): 100 mg via ORAL
  Filled 2016-07-16 (×5): qty 2

## 2016-07-16 MED ORDER — DOCUSATE SODIUM 100 MG PO CAPS
200.0000 mg | ORAL_CAPSULE | Freq: Every day | ORAL | Status: DC
  Administered 2016-07-17 – 2016-07-18 (×2): 200 mg via ORAL
  Filled 2016-07-16 (×2): qty 2

## 2016-07-16 MED ORDER — ATORVASTATIN CALCIUM 20 MG PO TABS
20.0000 mg | ORAL_TABLET | Freq: Every day | ORAL | Status: DC
  Administered 2016-07-17 – 2016-07-18 (×2): 20 mg via ORAL
  Filled 2016-07-16: qty 1
  Filled 2016-07-16: qty 2
  Filled 2016-07-16: qty 1

## 2016-07-16 MED ORDER — BISACODYL 5 MG PO TBEC: 10.0000 mg | DELAYED_RELEASE_TABLET | Freq: Every day | ORAL | Status: DC | PRN

## 2016-07-16 MED ORDER — SODIUM CHLORIDE 0.9 % IV SOLN: 250.0000 mL | INTRAVENOUS | Status: DC | PRN

## 2016-07-16 MED ORDER — ACETAMINOPHEN 325 MG PO TABS
650.0000 mg | ORAL_TABLET | Freq: Four times a day (QID) | ORAL | Status: DC | PRN
  Administered 2016-07-16: 650 mg via ORAL

## 2016-07-16 NOTE — Progress Notes (Signed)
2 Days Post-Op Procedure(s) (LRB): CORONARY ARTERY BYPASS GRAFTING (CABG) x 2 WITH BILATERAL IMA (N/A) TRANSESOPHAGEAL ECHOCARDIOGRAM (TEE) (N/A) Subjective: No complaints today. Ambulated already  Some nausea yesterday but none this am.  Objective: Vital signs in last 24 hours: Temp:  [98.5 F (36.9 C)-99.6 F (37.6 C)] 98.8 F (37.1 C) (02/17 0400) Pulse Rate:  [81-108] 89 (02/17 0600) Cardiac Rhythm: Normal sinus rhythm (02/17 0400) Resp:  [11-23] 14 (02/17 0600) BP: (93-122)/(66-88) 120/87 (02/17 0600) SpO2:  [89 %-99 %] 96 % (02/17 0600)  Hemodynamic parameters for last 24 hours:    Intake/Output from previous day: 02/16 0701 - 02/17 0700 In: 1088 [P.O.:540; I.V.:283; IV Piggyback:265] Out: 1280 [Urine:1220; Chest Tube:60] Intake/Output this shift: No intake/output data recorded.  General appearance: alert and cooperative Neurologic: intact Heart: regular rate and rhythm, S1, S2 normal, no murmur, click, rub or gallop Lungs: clear to auscultation bilaterally Abdomen: soft, non-tender; bowel sounds normal; no masses,  no organomegaly Extremities: extremities normal, atraumatic, no cyanosis or edema Wound: dressing dry  Lab Results:  Recent Labs  07/15/16 1703 07/15/16 1706 07/16/16 0502  WBC 13.9*  --  12.6*  HGB 10.6* 9.5* 10.1*  HCT 31.0* 28.0* 30.3*  PLT 140*  --  125*   BMET:  Recent Labs  07/15/16 0400  07/15/16 1706 07/16/16 0502  NA 137  --  138 138  K 3.8  --  3.7 4.0  CL 105  --  100* 104  CO2 22  --   --  26  GLUCOSE 113*  --  110* 109*  BUN 14  --  19 18  CREATININE 0.93  < > 1.00 0.90  CALCIUM 8.4*  --   --  8.6*  < > = values in this interval not displayed.  PT/INR:  Recent Labs  07/14/16 1237  LABPROT 15.8*  INR 1.25   ABG    Component Value Date/Time   PHART 7.318 (L) 07/14/2016 1701   HCO3 23.2 07/14/2016 1701   TCO2 28 07/15/2016 1706   ACIDBASEDEF 3.0 (H) 07/14/2016 1701   O2SAT 94.0 07/14/2016 1701   CBG (last 3)    Recent Labs  07/15/16 2313 07/16/16 0426 07/16/16 0805  GLUCAP 105* 83 100*   CXR: mild bibasilar atelectasis.  Assessment/Plan: S/P Procedure(s) (LRB): CORONARY ARTERY BYPASS GRAFTING (CABG) x 2 WITH BILATERAL IMA (N/A) TRANSESOPHAGEAL ECHOCARDIOGRAM (TEE) (N/A)  He is hemodynamically stable in sinus rhythm Mobilize, IS Plan for transfer to step-down: see transfer orders   LOS: 3 days    Alleen BorneBryan K Cooper Moroney 07/16/2016

## 2016-07-17 NOTE — Discharge Summary (Signed)
Physician Discharge Summary  Patient ID: Zachary PinaDaniel Kutz MRN: [id number] DOB/AGE: 07-08-1965 51 y.o.  Admit date: 07/13/2016 Discharge date: 07/18/2016  Admission Diagnoses: severe 2 vessel CAD  Discharge Diagnoses:  Active Problems:   Non-STEMI (non-ST elevated myocardial infarction) (HCC)   Coronary artery disease   S/P CABG x 2  Patient Active Problem List   Diagnosis Date Noted  . S/P CABG x 2   . Coronary artery disease 07/14/2016  . Non-STEMI (non-ST elevated myocardial infarction) (HCC) 07/13/2016  . Allergy     History of Present Illness    Zachary PinaDaniel Koenigs is a 51 y.o. male with past medical history of HTN, HLD and family history including father needing CABG at age 50, mopther died at age 50 of MI during severe pancreatitis and maternal GF having MI in his 830's who [facility] of chest pain.  The patient was awoken at 4:30 this morning with severe substernal squeezing pain with a heaviness in the upper arms bilaterally. The severe pain lasted for several minutes, followed by a moderate constant soreness. He continued to have waves of severe pain. He was diaphoretic and had brief nausea with vomiting X1. No dyspnea, orthopnea, palpitations, or edema. He has had this pain to a lesser degree a few other times in his life with the last time being around 7 or 8 months ago. He has had no recent illness. No heart failure like symptoms.   In the ED his chest pain resolved with SL NTG X2. He now has a mild vague chest soreness.    He smoked 1 PPD since age 51 and quit about 4 years ago. He smokes marijuana occasionally and denies any other drug use except cocaine over 30 years ago. He was started on Hyzaar for HTN in 02/2016. He takes lipitor for HLD.   His first troponin was negative but second is 2.04 (POC). EKG is without acute ischemic changes CXR Pulmonary hyperinflation. No acute cardiopulmonary abnormality.  He has never had cardiac  evaluation such as cath or stress test.   Discharged Condition: good  Hospital Course: The patient presented to the emergency department. He did rule in for non-STEMI. He was felt to require urgent cardiac catheterization. He was stabilized medically prior to catheterization. He was found to have severe two-vessel coronary artery disease. Crit thoracic surgical consultation was obtained with Charlett LangoSteven Codee Bloodworth M.D. who felt he would require coronary artery bypass grafting. This was scheduled and on 07/14/2016 he underwent the below described procedure. He tolerated well was taken to the surgical intensive care unit in stable condition.   Postoperative hospital course:  Patient is overall progressed well. He was weaned from the ventilator without difficulty. He is maintained stable hemodynamics and normal sinus rhythm. His expected volume overload but is responding well to diuretics. He has expected acute blood loss anemia which is stable. Blood sugars have been under adequate control using standard measures. He is tolerating gradually increasing activities using routine protocols. Incisions are healing well without evidence of infection. He is tolerating diet. Oxygen has been weaned and he maintains good saturations on room air. At time of discharge he felt to be quite stable.  Consults: cardiology  Significant Diagnostic Studies: angiography: cardiac cath  Treatments: surgery:   DATE OF PROCEDURE:  07/14/2016 DATE OF DISCHARGE:  OPERATIVE REPORT   PREOPERATIVE DIAGNOSES:  Two-vessel coronary disease, status post non-ST elevation myocardial infarction.  POSTOPERATIVE DIAGNOSES:  Two-vessel coronary disease, status post non- ST elevation myocardial infarction.  PROCEDURE:  Median sternotomy, extracorporeal circulation, coronary artery bypass grafting x2 (left internal mammary artery to LAD, right internal mammary artery to distal right  coronary).  SURGEON:  Salvatore Decent. Dorris Fetch, M.D.  FIRST ASSISTANT:  Doree Fudge, PA.  SECOND ASSISTANT:  Wells Guiles, PA student.  ANESTHESIA:  General.  FINDINGS:  Good quality targets, good quality conduits.  Transesophageal echocardiography showed some anterior and inferior hypokinesis.  No significant valvular pathology.  Disposition: 01-Home or Self Care   Discharge medications:   Allergies as of 07/18/2016      Reactions   Penicillins Other (See Comments)   Childhood allergy      Medication List    STOP taking these medications   clindamycin 300 MG capsule Commonly known as:  CLEOCIN   GOODY HEADACHE PO   HYDROcodone-acetaminophen 5-325 MG tablet Commonly known as:  NORCO/VICODIN   losartan-hydrochlorothiazide 100-12.5 MG tablet Commonly known as:  HYZAAR     TAKE these medications   acetaminophen 325 MG tablet Commonly known as:  TYLENOL Take 2 tablets (650 mg total) by mouth every 6 (six) hours as needed for mild pain.   aspirin 325 MG EC tablet Take 1 tablet (325 mg total) by mouth daily.   atorvastatin 20 MG tablet Commonly known as:  LIPITOR Take 1 tablet (20 mg total) by mouth daily.   esomeprazole 40 MG capsule Commonly known as:  NEXIUM Take 1 capsule (40 mg total) by mouth daily.   metoprolol tartrate 25 MG tablet Commonly known as:  LOPRESSOR Take 1 tablet (25 mg total) by mouth 2 (two) times daily.   multivitamin tablet Take 1 tablet by mouth daily.   traMADol 50 MG tablet Commonly known as:  ULTRAM Take 1-2 tablets (50-100 mg total) by mouth every 4 (four) hours as needed for moderate pain.      Follow-up Information    Lorine Bears, MD Follow up.   Specialty:  Cardiology Why:  The office should contact you with a 2 week appointment. If they do not call in the next couple days please contact them to arrange. Contact information: [address] [place] [address] [phone]         Loreli Slot, MD Follow up.   Specialty:  Cardiothoracic Surgery Why:  The office should contact you with a 4 week appointment with the surgeon. If they do not contact her the next couple days please call to arrange. You'll need a chest x-ray from [facility] imaging one half hour prior to this appointment (in same building) Contact information: [address] [place] [address] [phone]          The patient has been discharged on:   1.Beta Blocker:  Yes [ y  ]                              No   [   ]                              If No, reason:  2.Ace Inhibitor/ARB: Yes [   ]  No  [   n ]                                     If No, reason:BP too low  3.Statin:   Yes [place].Etienne   ]                  No  [   ]                  If No, reason:  4.Ecasa:  Yes  [ y  ]                  No   [   ]                  If No, reason:  Signed: BARRETT, ERIN 07/18/2016, 7:55 AM

## 2016-07-17 NOTE — Progress Notes (Signed)
Pulled Pt. Left wire with no resistance. Right wire was resistant. Fayrene FearingJames RN pulled right wire with some resistance. All wires intact. Pt. tolerated procedure well. Pt. In NSR.

## 2016-07-17 NOTE — Progress Notes (Addendum)
[address]       [place] [address]             [phone]      3 Days Post-Op Procedure(s) (LRB): CORONARY ARTERY BYPASS GRAFTING (CABG) x 2 WITH BILATERAL IMA (N/A) TRANSESOPHAGEAL ECHOCARDIOGRAM (TEE) (N/A) Subjective: Looks and feels well  Objective: Vital signs in last 24 hours: Temp:  [97.7 F (36.5 C)-98.2 F (36.8 C)] 98.2 F (36.8 C) (02/18 0459) Pulse Rate:  [81-96] 87 (02/18 0459) Cardiac Rhythm: Normal sinus rhythm (02/18 0700) Resp:  [13-22] 18 (02/18 0459) BP: (98-117)/(70-88) 107/80 (02/18 0459) SpO2:  [93 %-98 %] 97 % (02/18 0459) Weight:  [176 lb 12.8 oz (80.2 kg)] 176 lb 12.8 oz (80.2 kg) (02/18 0459)  Hemodynamic parameters for last 24 hours:    Intake/Output from previous day: 02/17 0701 - 02/18 0700 In: 290 [P.O.:240; I.V.:50] Out: 600 [Urine:600] Intake/Output this shift: Total I/O In: -  Out: 200 [Urine:200]  General appearance: alert, cooperative and no distress Heart: regular rate and rhythm Lungs: clear to auscultation bilaterally Abdomen: benign Extremities: no edema Wound: incis healing well  Lab Results:  Recent Labs  07/15/16 1703 07/15/16 1706 07/16/16 0502  WBC 13.9*  --  12.6*  HGB 10.6* 9.5* 10.1*  HCT 31.0* 28.0* 30.3*  PLT 140*  --  125*   BMET:  Recent Labs  07/15/16 0400  07/15/16 1706 07/16/16 0502  NA 137  --  138 138  K 3.8  --  3.7 4.0  CL 105  --  100* 104  CO2 22  --   --  26  GLUCOSE 113*  --  110* 109*  BUN 14  --  19 18  CREATININE 0.93  < > 1.00 0.90  CALCIUM 8.4*  --   --  8.6*  < > = values in this interval not displayed.  PT/INR:  Recent Labs  07/14/16 1237  LABPROT 15.8*  INR 1.25   ABG    Component Value Date/Time   PHART 7.318 (L) 07/14/2016 1701   HCO3 23.2 07/14/2016 1701   TCO2 28 07/15/2016 1706   ACIDBASEDEF 3.0 (H) 07/14/2016 1701   O2SAT 94.0 07/14/2016 1701   CBG (last 3)   Recent Labs  07/15/16 2313 07/16/16 0426 07/16/16 0805  GLUCAP  105* 83 100*    Meds Scheduled Meds: . aspirin EC  325 mg Oral Daily  . atorvastatin  20 mg Oral Daily  . docusate sodium  200 mg Oral Daily  . enoxaparin (LOVENOX) injection  40 mg Subcutaneous QHS  . furosemide  40 mg Oral Daily  . mouth rinse  15 mL Mouth Rinse BID  . metoprolol tartrate  12.5 mg Oral BID  . moving right along book   Does not apply Once  . pantoprazole  40 mg Oral QAC breakfast  . potassium chloride  20 mEq Oral BID  . sodium chloride flush  3 mL Intravenous Q12H   Continuous Infusions: PRN Meds:.sodium chloride, acetaminophen, bisacodyl **OR** bisacodyl, ondansetron **OR** ondansetron (ZOFRAN) IV, oxyCODONE, sodium chloride flush, traMADol  Xrays Dg Chest Port 1 View  Result Date: 07/16/2016 CLINICAL DATA:  Status post CABG surgery.  Subsequent encounter. EXAM: PORTABLE CHEST 1 VIEW COMPARISON:  07/15/2016 FINDINGS: Since the prior study, all lines and tubes have been removed with the exception of the right internal jugular Cordis. Cardiac silhouette is normal in size.  No mediastinal widening. There is persistent basilar atelectasis likely with small  effusions. Lungs otherwise clear with no evidence of pulmonary edema. There is no pneumothorax. IMPRESSION: 1. No acute findings or evidence of an operative complication. 2. Persistent mild basilar atelectasis likely with small effusions. Lungs otherwise clear. No pneumothorax. Electronically Signed   By: Amie Portlandavid  Ormond M.D.   On: 07/16/2016 07:49    Assessment/Plan: S/P Procedure(s) (LRB): CORONARY ARTERY BYPASS GRAFTING (CABG) x 2 WITH BILATERAL IMA (N/A) TRANSESOPHAGEAL ECHOCARDIOGRAM (TEE) (N/A)  1 good progress  2 d/c epw's today 3 pulm toilet , routine rehab 4 sugars pretty well controlled 5 stop lasix 6 prob home in am if no new issues  LOS: 4 days    GOLD,WAYNE E 07/17/2016   Chart reviewed, patient examined, agree with above. He looks good. Continue ambulation and IS. Home tomorrow if no new  problems.

## 2016-07-18 DIAGNOSIS — Z951 Presence of aortocoronary bypass graft: Secondary | ICD-10-CM

## 2016-07-18 MED ORDER — ACETAMINOPHEN 325 MG PO TABS: 650.0000 mg | ORAL_TABLET | Freq: Four times a day (QID) | ORAL | Status: DC | PRN

## 2016-07-18 MED ORDER — ASPIRIN 325 MG PO TBEC: 325.0000 mg | DELAYED_RELEASE_TABLET | Freq: Every day | ORAL | 0 refills | Status: DC

## 2016-07-18 MED ORDER — METOPROLOL TARTRATE 25 MG PO TABS
25.0000 mg | ORAL_TABLET | Freq: Two times a day (BID) | ORAL | Status: DC
  Administered 2016-07-18: 25 mg via ORAL
  Filled 2016-07-18: qty 1

## 2016-07-18 MED ORDER — METOPROLOL TARTRATE 25 MG PO TABS: 25.0000 mg | ORAL_TABLET | Freq: Two times a day (BID) | ORAL | 3 refills | Status: DC

## 2016-07-18 MED ORDER — TRAMADOL HCL 50 MG PO TABS: 50.0000 mg | ORAL_TABLET | ORAL | 0 refills | Status: DC | PRN

## 2016-07-18 NOTE — Progress Notes (Signed)
CT sutures removed per order and per protocol. Sites painted with tincture, steri-strips applied. Pt educated not to remove.

## 2016-07-18 NOTE — Discharge Instructions (Signed)
Coronary Artery Bypass Grafting, Care After °Refer to this sheet in the next few weeks. These instructions provide you with information on caring for yourself after your procedure. Your health care provider may also give you more specific instructions. Your treatment has been planned according to current medical practices, but problems sometimes occur. Call your health care provider if you have any problems or questions after your procedure. °WHAT TO EXPECT AFTER THE PROCEDURE °Recovery from surgery will be different for everyone. Some people feel well after 3 or 4 weeks, while for others it takes longer. After your procedure, it is typical to have the following: °· Nausea and a lack of appetite.   °· Constipation. °· Weakness and fatigue.   °· Depression or irritability.   °· Pain or discomfort at your incision site. °HOME CARE INSTRUCTIONS °· Take medicines only as directed by your health care provider. Do not stop taking medicines or start any new medicines without first checking with your health care provider. °· Take your pulse as directed by your health care provider. °· Perform deep breathing as directed by your health care provider. If you were given a device called an incentive spirometer, use it to practice deep breathing several times a day. Support your chest with a pillow or your arms when you take deep breaths or cough. °· Keep incision areas clean, dry, and protected. Remove or change any bandages (dressings) only as directed by your health care provider. You may have skin adhesive strips over the incision areas. Do not take the strips off. They will fall off on their own. °· Check incision areas daily for any swelling, redness, or drainage. °· If incisions were made in your legs, do the following: °¨ Avoid crossing your legs.   °¨ Avoid sitting for long periods of time. Change positions every 30 minutes.   °¨ Elevate your legs when you are sitting. °· Wear compression stockings as directed by your  health care provider. These stockings help keep blood clots from forming in your legs. °· Take showers once your health care provider approves. Until then, only take sponge baths. Pat incisions dry. Do not rub incisions with a washcloth or towel. Do not take baths, swim, or use a hot tub until your health care provider approves. °· Eat foods that are high in fiber, such as raw fruits and vegetables, whole grains, beans, and nuts. Meats should be lean cut. Avoid canned, processed, and fried foods. °· Drink enough fluid to keep your urine clear or pale yellow. °· Weigh yourself every day. This helps identify if you are retaining fluid that may make your heart and lungs work harder. °· Rest and limit activity as directed by your health care provider. You may be instructed to: °¨ Stop any activity at once if you have chest pain, shortness of breath, irregular heartbeats, or dizziness. Get help right away if you have any of these symptoms. °¨ Move around frequently for short periods or take short walks as directed by your health care provider. Increase your activities gradually. You may need physical therapy or cardiac rehabilitation to help strengthen your muscles and build your endurance. °¨ Avoid lifting, pushing, or pulling anything heavier than 10 lb (4.5 kg) for at least 6 weeks after surgery. °· Do not drive until your health care provider approves.  °· Ask your health care provider when you may return to work. °· Ask your health care provider when you may resume sexual activity. °· Keep all follow-up visits as directed by your health care   provider. This is important. °SEEK MEDICAL CARE IF: °· You have swelling, redness, increasing pain, or drainage at the site of an incision. °· You have a fever. °· You have swelling in your ankles or legs. °· You have pain in your legs.   °· You gain 2 or more pounds (0.9 kg) a day. °· You are nauseous or vomit. °· You have diarrhea.  °SEEK IMMEDIATE MEDICAL CARE IF: °· You have  chest pain that goes to your jaw or arms. °· You have shortness of breath.   °· You have a fast or irregular heartbeat.   °· You notice a "clicking" in your breastbone (sternum) when you move.   °· You have numbness or weakness in your arms or legs. °· You feel dizzy or light-headed.   °MAKE SURE YOU: °· Understand these instructions. °· Will watch your condition. °· Will get help right away if you are not doing well or get worse. °This information is not intended to replace advice given to you by your health care provider. Make sure you discuss any questions you have with your health care provider. °Document Released: 12/03/2004 Document Revised: 06/06/2014 Document Reviewed: 10/23/2012 °Elsevier Interactive Patient Education © 2017 [employer] ° °

## 2016-07-18 NOTE — Progress Notes (Addendum)
      [address]       [place] [id number]             [phone]      4 Days Post-Op Procedure(s) (LRB): CORONARY ARTERY BYPASS GRAFTING (CABG) x 2 WITH BILATERAL IMA (N/A) TRANSESOPHAGEAL ECHOCARDIOGRAM (TEE) (N/A)   Subjective:  No complaints.  Feels great wants to go home.  Objective: Vital signs in last 24 hours: Temp:  [98.4 F (36.9 C)-98.7 F (37.1 C)] 98.7 F (37.1 C) (02/19 0551) Pulse Rate:  [85-97] 94 (02/19 0551) Cardiac Rhythm: Normal sinus rhythm (02/19 0700) Resp:  [18] 18 (02/19 0551) BP: (117-125)/(84-92) 119/86 (02/19 0551) SpO2:  [96 %-97 %] 96 % (02/19 0551) Weight:  [172 lb 14.4 oz (78.4 kg)] 172 lb 14.4 oz (78.4 kg) (02/19 0551)  Intake/Output from previous day: 02/18 0701 - 02/19 0700 In: -  Out: 200 [Urine:200]  General appearance: alert, cooperative and no distress Heart: regular rate and rhythm Lungs: clear to auscultation bilaterally Abdomen: soft, non-tender; bowel sounds normal; no masses,  no organomegaly Extremities: extremities normal, atraumatic, no cyanosis or edema Wound: clean and dry  Lab Results:  Recent Labs  07/15/16 1703 07/15/16 1706 07/16/16 0502  WBC 13.9*  --  12.6*  HGB 10.6* 9.5* 10.1*  HCT 31.0* 28.0* 30.3*  PLT 140*  --  125*   BMET:  Recent Labs  07/15/16 1706 07/16/16 0502  NA 138 138  K 3.7 4.0  CL 100* 104  CO2  --  26  GLUCOSE 110* 109*  BUN 19 18  CREATININE 1.00 0.90  CALCIUM  --  8.6*    PT/INR: No results for input(s): LABPROT, INR in the last 72 hours. ABG    Component Value Date/Time   PHART 7.318 (L) 07/14/2016 1701   HCO3 23.2 07/14/2016 1701   TCO2 28 07/15/2016 1706   ACIDBASEDEF 3.0 (H) 07/14/2016 1701   O2SAT 94.0 07/14/2016 1701   CBG (last 3)   Recent Labs  07/15/16 2313 07/16/16 0426 07/16/16 0805  GLUCAP 105* 83 100*    Assessment/Plan: S/P Procedure(s) (LRB): CORONARY ARTERY BYPASS GRAFTING (CABG) x 2 WITH BILATERAL IMA  (N/A) TRANSESOPHAGEAL ECHOCARDIOGRAM (TEE) (N/A)  1. CV- Sinus Tach, will increase Lopressor to 25 mg BID 2. Pulm- no acute issues, continue IS 3. Renal- creatinine has been WNL, weight is down to baseline 4. CBGs controlled, patient not a diabetic 5. Dispo-patient stable, wires out will d/c home today   LOS: 5 days    Lowella DandyBARRETT, ERIN 07/18/2016 Patient seen and examined, agree with above  Viviann SpareSteven C. Dorris FetchHendrickson, MD [facility] [phone]

## 2016-07-18 NOTE — Care Management Note (Signed)
Case Management Note Donn PieriniKristi Yarisa Lynam RN, BSN Unit 2W-Case Manager [phone]  Patient Details  Name: Zachary PinaDaniel Loadholt MRN: [id number] Date of Birth: 05-Oct-1965  Subjective/Objective:  Pt admitted with STEMI,  S/p CABG                 Action/Plan: PTA pt lived at home with wife, independent- plan to return home, no CM needs noted for discharge.   Expected Discharge Date:  07/18/16               Expected Discharge Plan:  Home/Self Care  In-House Referral:     Discharge planning Services  CM Consult  Post Acute Care Choice:  NA Choice offered to:  NA  DME Arranged:    DME Agency:     HH Arranged:    HH Agency:     Status of Service:  Completed, signed off  If discussed at Long Length of Stay Meetings, dates discussed:    Discharge Disposition: home/self care   Additional Comments:  Darrold SpanWebster, Anneke Cundy Hall, RN 07/18/2016, 10:07 AM

## 2016-07-18 NOTE — Progress Notes (Signed)
CARDIAC REHAB PHASE I   PRE:  Rate/Rhythm: 101 ST    BP: sitting 119/80    SaO2: 94 RA  MODE:  Ambulation: 700 ft   POST:  Rate/Rhythm: 119 ST    BP: sitting 123/84     SaO2: 94 RA  Moving independently. Steady walking, no c/o with walk. Does st his right chest feels numb. Ed completed with pt and wife. Voiced understanding. Will refer to [place] CRPII. Set up d/c video.  [id number]   Harriet MassonRandi Kristan Verdene Creson CES, ACSM 07/18/2016 9:02 AM

## 2016-07-20 ENCOUNTER — Telehealth (HOSPITAL_COMMUNITY): Payer: Self-pay | Admitting: Family Medicine

## 2016-07-20 ENCOUNTER — Telehealth: Payer: Self-pay | Admitting: *Deleted

## 2016-07-20 NOTE — Telephone Encounter (Addendum)
Pulled pt's information through Passport to verify patients Insurance Benefits through  [place].  No Co-pay, Deductible is $400.00, which has been met. Out of Pocket $400.00 which has been met.  Co-Insurance 50% Tier 2 and Co-insurance 30% Tier 1.  Reference # [id number].   KJ

## 2016-07-20 NOTE — Telephone Encounter (Signed)
[name] had CABG X 2 on 07/14/16 by Dr. Dorris FetchHendrickson. He has called to report slight blood tinged drainage at the proximal end of his sternal incision tahat started yesterday, but has stopped this morning. He felt that he physically did too much yesterday, a lot of twisting and turning with moving things.

## 2016-07-25 ENCOUNTER — Other Ambulatory Visit: Payer: Self-pay | Admitting: *Deleted

## 2016-07-25 ENCOUNTER — Other Ambulatory Visit: Payer: Self-pay | Admitting: Physician Assistant

## 2016-07-25 DIAGNOSIS — G8918 Other acute postprocedural pain: Secondary | ICD-10-CM

## 2016-07-25 MED ORDER — TRAMADOL HCL 50 MG PO TABS: 50.0000 mg | ORAL_TABLET | ORAL | 0 refills | Status: DC | PRN

## 2016-07-29 ENCOUNTER — Ambulatory Visit (INDEPENDENT_AMBULATORY_CARE_PROVIDER_SITE_OTHER): Payer: BLUE CROSS/BLUE SHIELD | Admitting: Cardiology

## 2016-07-29 ENCOUNTER — Encounter: Payer: Self-pay | Admitting: Cardiology

## 2016-07-29 VITALS — BP 110/76 | HR 89 | Ht 74.0 in | Wt 172.4 lb

## 2016-07-29 DIAGNOSIS — I2510 Atherosclerotic heart disease of native coronary artery without angina pectoris: Secondary | ICD-10-CM

## 2016-07-29 DIAGNOSIS — Z951 Presence of aortocoronary bypass graft: Secondary | ICD-10-CM

## 2016-07-29 MED ORDER — ATORVASTATIN CALCIUM 40 MG PO TABS: 40.0000 mg | ORAL_TABLET | Freq: Every day | ORAL | 3 refills | Status: DC

## 2016-07-29 NOTE — Patient Instructions (Signed)
Medication Instructions:  1. INCREASE LIPITOR 40 MG DAILY; NEW RX SENT IN  Labwork: NONE  Testing/Procedures: 1. IN 3  MONTHS YOU WILL NEED FASTING LIPID AND LIVER PANEL  Follow-Up: 3 MONTHS WITH DR. VARANASI   Any Other Special Instructions Will Be Listed Below (If Applicable).     If you need a refill on your cardiac medications before your next appointment, please call your pharmacy.

## 2016-07-29 NOTE — Progress Notes (Signed)
07/29/2016 Zachary Cuevas   1965/06/24  [id number]  Primary Physician Lynnea FerrierPICKARD,WARREN TOM, MD Primary Cardiologist: Dr. Eldridge DaceVaranasi    Reason for Visit/CC: [facility] CAD s/p NSTEi + CABG  HPI:  The patient is a 51 year old male with past medical history of hypertension hyperlipidemia and strong family history of premature CAD who presents to clinic today for post hospital follow-up after recent admission for non-STEMI. He was found to have  severe two-vessel coronary artery disease, requiring CABG surgery. EF was 35-40% by cath estimate. His CABG was performed by Dr. Dorris FetchHendrickson on Feb 15th 2018. He underwent CABG x 2 with LIMA to LAD and RIMA to RCA. Operative TEE showed an EF of 45-50%. Postoperative course was uncomplicated. No issues with any atrial arrhythmias. He was discharged home on 07/17/2016. Of note, recent FLP showed LDL at 72 mg/dL. Hgb A1c was 5.7. He is on ASA, statin and a BB.   He presents to clinic today for post hospital follow-up. He is here with his wife. He is doing well. No recurrent angina. No dyspnea. Sternal incision site is healing well. Cath site is stable. EKG shows NSR. HR 70 bpm. BP is well controlled. He reports full medication compliance.     Current Meds  Medication Sig  . acetaminophen (TYLENOL) 325 MG tablet Take 2 tablets (650 mg total) by mouth every 6 (six) hours as needed for mild pain.  Marland Kitchen. aspirin EC 325 MG EC tablet Take 1 tablet (325 mg total) by mouth daily.  Marland Kitchen. atorvastatin (LIPITOR) 20 MG tablet Take 1 tablet (20 mg total) by mouth daily.  Marland Kitchen. esomeprazole (NEXIUM) 40 MG capsule Take 1 capsule (40 mg total) by mouth daily.  . metoprolol tartrate (LOPRESSOR) 25 MG tablet Take 1 tablet (25 mg total) by mouth 2 (two) times daily.  . Multiple Vitamin (MULTIVITAMIN) tablet Take 1 tablet by mouth daily.  . traMADol (ULTRAM) 50 MG tablet TAKE 1-2 TABLETS BY MOUTH EVERY 4 HOURS AS NEEDED FOR PAIN   Allergies  Allergen Reactions  . Penicillins  Other (See Comments)    Childhood allergy   Past Medical History:  Diagnosis Date  . Allergy    hay fever  . Hypercholesteremia   . Hypertension    Family History  Problem Relation Age of Onset  . Miscarriages / [place] Mother   . CAD Mother [id number]  . Heart disease Maternal Grandfather   . Diabetes Paternal Grandfather   . Heart disease Paternal Grandfather   . Heart disease Father [id number]  . Diabetes Brother    Past Surgical History:  Procedure Laterality Date  . CORONARY ARTERY BYPASS GRAFT N/A 07/14/2016   Procedure: CORONARY ARTERY BYPASS GRAFTING (CABG) x 2 WITH BILATERAL IMA;  Surgeon: Loreli SlotSteven C Hendrickson, MD;  [facility];  Service: Open Heart Surgery;  Laterality: N/A;  . CORONARY BALLOON ANGIOPLASTY N/A 07/13/2016   Procedure: Coronary Balloon Angioplasty;  Surgeon: Iran OuchMuhammad A Arida, MD;  Location: [facility] INVASIVE CV LAB;  Service: Cardiovascular;  Laterality: N/A;  . FRACTURE SURGERY  2005   boxer fracture  of hands  . LEFT HEART CATH AND CORONARY ANGIOGRAPHY N/A 07/13/2016   Procedure: Left Heart Cath and Coronary Angiography;  Surgeon: Iran OuchMuhammad A Arida, MD;  Location: [facility] INVASIVE CV LAB;  Service: Cardiovascular;  Laterality: N/A;  . TEE WITHOUT CARDIOVERSION N/A 07/14/2016   Procedure: TRANSESOPHAGEAL ECHOCARDIOGRAM (TEE);  Surgeon: Loreli SlotSteven C Hendrickson, MD;  [facility];  Service: Open Heart Surgery;  Laterality: N/A;  Social History   Social History  . Marital status: Married    Spouse name: N/A  . Number of children: N/A  . Years of education: N/A   Occupational History  . Not on file.   Social History Main Topics  . Smoking status: Former Smoker    Quit date: 08/11/2011  . Smokeless tobacco: Never Used  . Alcohol use No  . Drug use: Yes    Types: Marijuana  . Sexual activity: Yes   Other Topics Concern  . Not on file   Social History Narrative  . No narrative on file     Review of Systems: General: negative for chills, fever, night sweats or  weight changes.  Cardiovascular: negative for chest pain, dyspnea on exertion, edema, orthopnea, palpitations, paroxysmal nocturnal dyspnea or shortness of breath Dermatological: negative for rash Respiratory: negative for cough or wheezing Urologic: negative for hematuria Abdominal: negative for nausea, vomiting, diarrhea, bright red blood per rectum, melena, or hematemesis Neurologic: negative for visual changes, syncope, or dizziness All other systems reviewed and are otherwise negative except as noted above.   Physical Exam:  Blood pressure 110/76, pulse 89, height 6\' 2"  (1.88 m), weight 172 lb 6.4 oz (78.2 kg).  General appearance: alert, cooperative and no distress Neck: no carotid bruit and no JVD Lungs: clear to auscultation bilaterally Heart: regular rate and rhythm, S1, S2 normal, no murmur, click, rub or gallop Extremities: extremities normal, atraumatic, no cyanosis or edema Pulses: 2+ and symmetric Skin: Skin color, texture, turgor normal. No rashes or lesions Neurologic: Grossly normal  EKG NSR. No ischemia.   ASSESSMENT AND PLAN:   1. CAD: s/p NSTEMI with LHC showing severe 2V disease involving the RCA and LAD. S/p CABG x 2 with LIMA-LAD and RIMA to RCA. Stable w/o recurrent angina. Continue medical therapy with ASA, metoprolol and Lipitor. Encouraged cardiac rehab after he is cleared by CT surgery.   2. HLD: LDL 72 mg/dL. He was on Lipitor 20 prior to MI. We will increase dose to 40 mg daily. Repeat FLP and HFTs in 6 weeks.   3. Pre-diabetes: Hgb A1c 5.7. Pt encouraged to continue routine f/u with PCP, who should continue to monitor this.   4. LV Systolic Dysfunction: EF 45-50% on perioperative TEE. Volume is stable. No dyspnea.   PLAN  F/u with Dr. Eldridge Dace in 2-3 months.   Robbie Lis PA-C 07/29/2016 3:09 PM

## 2016-08-15 ENCOUNTER — Other Ambulatory Visit: Payer: Self-pay | Admitting: Thoracic Surgery (Cardiothoracic Vascular Surgery)

## 2016-08-15 DIAGNOSIS — Z951 Presence of aortocoronary bypass graft: Secondary | ICD-10-CM

## 2016-08-16 ENCOUNTER — Encounter: Payer: Self-pay | Admitting: Thoracic Surgery (Cardiothoracic Vascular Surgery)

## 2016-08-16 ENCOUNTER — Ambulatory Visit (INDEPENDENT_AMBULATORY_CARE_PROVIDER_SITE_OTHER): Payer: Self-pay | Admitting: Thoracic Surgery (Cardiothoracic Vascular Surgery)

## 2016-08-16 ENCOUNTER — Ambulatory Visit
Admission: RE | Admit: 2016-08-16 | Discharge: 2016-08-16 | Disposition: A | Discharge status: Home / Self Care | Payer: BLUE CROSS/BLUE SHIELD | Source: Ambulatory Visit | Attending: Thoracic Surgery (Cardiothoracic Vascular Surgery) | Admitting: Thoracic Surgery (Cardiothoracic Vascular Surgery)

## 2016-08-16 VITALS — BP 147/97 | HR 70 | Resp 20 | Ht 74.0 in | Wt 172.0 lb

## 2016-08-16 DIAGNOSIS — Z951 Presence of aortocoronary bypass graft: Secondary | ICD-10-CM

## 2016-08-16 DIAGNOSIS — R079 Chest pain, unspecified: Secondary | ICD-10-CM | POA: Diagnosis not present

## 2016-08-16 MED ORDER — LISINOPRIL 10 MG PO TABS: 10.0000 mg | ORAL_TABLET | Freq: Every day | ORAL | 3 refills | Status: DC

## 2016-08-16 NOTE — Progress Notes (Signed)
[address]       [place] [id number]             [phone]       HPI: [name] returns today for scheduled postoperative follow-up visit  He is a 51 year old man with history of hypertension and hypercholesterolemia who presented with a non-ST elevation MI in February. He underwent coronary bypass grafting 2 with bilateral mammaries on 07/14/2016. His postoperative course was unremarkable and she went home on day 4.  He has some incisional discomfort primarily along the path with a mammary arteries. He is not taking any pain medication at this point. He has not had any recurrent angina. He is anxious to resume normal activities.   Past Medical History:  Diagnosis Date  . Allergy    hay fever  . Hypercholesteremia   . Hypertension      Current Outpatient Prescriptions  Medication Sig Dispense Refill  . acetaminophen (TYLENOL) 325 MG tablet Take 2 tablets (650 mg total) by mouth every 6 (six) hours as needed for mild pain.    Marland Kitchen. aspirin EC 325 MG EC tablet Take 1 tablet (325 mg total) by mouth daily. 30 tablet 0  . atorvastatin (LIPITOR) 40 MG tablet Take 1 tablet (40 mg total) by mouth daily. 90 tablet 3  . esomeprazole (NEXIUM) 40 MG capsule Take 1 capsule (40 mg total) by mouth daily. 30 capsule 5  . metoprolol tartrate (LOPRESSOR) 25 MG tablet Take 1 tablet (25 mg total) by mouth 2 (two) times daily. 60 tablet 3  . Multiple Vitamin (MULTIVITAMIN) tablet Take 1 tablet by mouth daily.    . traMADol (ULTRAM) 50 MG tablet TAKE 1-2 TABLETS BY MOUTH EVERY 4 HOURS AS NEEDED FOR PAIN     No current facility-administered medications for this visit.     Physical Exam BP (!) 147/97   Pulse 70   Resp 20   Ht 6\' 2"  (1.88 m)   Wt 172 lb (78 kg)   SpO2 98% Comment: RA  BMI 22.4308 kg/m  51 year old man in no acute distress Alert and oriented x 3 with no focal deficits Cardiac regular rate and rhythm normal S1 and S2 Lungs clear with equal breath sounds  bilaterally Sternum stable, incision clean dry and intact No peripheral edema  Diagnostic Tests: CHEST  2 VIEW  COMPARISON:  Chest x-ray 07/16/2016.  FINDINGS: Lung volumes are normal. No consolidative airspace disease. No pleural effusions. No pneumothorax. No pulmonary nodule or mass noted. Pulmonary vasculature and the cardiomediastinal silhouette are within normal limits. Aortic atherosclerosis. Status post median sternotomy for CABG.  IMPRESSION: No radiographic evidence of acute cardiopulmonary disease.   Electronically Signed   By: Trudie Reedaniel  Entrikin M.D.   On: 08/16/2016 10:00 I personally reviewed the chest x-ray and concur with the findings noted above.  Impression: [name] is a 51 year old gentleman who had coronary bypass grafting 2 with bilateral mammaries on 07/14/2016 after presenting with a non-Q-wave MI. He did well postoperatively and his continued to do well since he was discharged from the hospital.  He may resume driving. Appropriate precautions were discussed. He should not lift anything over 10 pounds for another 2 weeks. 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 He is anxious to go back to work, but I advised him to stay out of work for another month. At that point he may go back on light duty.  Hypertension- his blood pressure is elevated today. He has been checking at  home and says the systolic pressure is been running around 140 sometimes as high as 150. He would benefit from an ACE inhibitor. I'm going to start him on lisinopril 10 mg daily. I did caution him about potential side effects.  Plan: Lisinopril 10 mg daily.  Follow-up with Dr. Eldridge Dace  I'll be happy to see him back any time if I can be of any further assistance with his care.  Loreli Slot, MD [facility] [phone]

## 2016-08-17 ENCOUNTER — Other Ambulatory Visit: Payer: Self-pay | Admitting: Family Medicine

## 2016-08-17 MED ORDER — ESOMEPRAZOLE MAGNESIUM 40 MG PO CPDR: 40.0000 mg | DELAYED_RELEASE_CAPSULE | Freq: Every day | ORAL | 3 refills | Status: DC

## 2016-08-18 ENCOUNTER — Encounter (HOSPITAL_COMMUNITY): Payer: Self-pay

## 2016-08-18 ENCOUNTER — Encounter (HOSPITAL_COMMUNITY)
Admission: RE | Admit: 2016-08-18 | Discharge: 2016-08-18 | Disposition: A | Discharge status: Home / Self Care | Payer: BLUE CROSS/BLUE SHIELD | Source: Ambulatory Visit | Attending: Interventional Cardiology | Admitting: Interventional Cardiology

## 2016-08-18 VITALS — BP 128/96 | HR 61 | Ht 72.5 in | Wt 177.9 lb

## 2016-08-18 DIAGNOSIS — I214 Non-ST elevation (NSTEMI) myocardial infarction: Secondary | ICD-10-CM | POA: Diagnosis not present

## 2016-08-18 DIAGNOSIS — Z951 Presence of aortocoronary bypass graft: Secondary | ICD-10-CM | POA: Diagnosis not present

## 2016-08-18 NOTE — Progress Notes (Signed)
Cardiac Rehab Medication Review by a Pharmacist  Does the patient  feel that his/her medications are working for him/her?  yes  Has the patient been experiencing any side effects to the medications prescribed?  no  Does the patient measure his/her own blood pressure or blood glucose at home?  no   Does the patient have any problems obtaining medications due to transportation or finances?   no  Understanding of regimen: fair Understanding of indications: fair Potential of compliance: fair    Pharmacist comments: [id number] YOM presenting today for cardiac rehab orientation. His BP was elevated today. He has not picked up his new lisinopril, but that should help with his blood pressure. He also had questions regarding whether he needs to keep taking his nexium. During a recent trial off, he said he seemed to do fine. I counseled him to discuss with his doctor and ask about stopping.     Jenita SeashoreKai N Jenson Beedle 08/18/2016 8:27 AM

## 2016-08-18 NOTE — Progress Notes (Signed)
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  acetaminophen (TYLENOL) 325 MG tablet, Take 2 tablets (650 mg total) by mouth every 6 (six) hours as needed for mild pain., Disp: , Rfl:  .  aspirin EC 325 MG EC tablet, Take 1 tablet (325 mg total) by mouth daily., Disp: 30 tablet, Rfl: 0 .  atorvastatin (LIPITOR) 40 MG tablet, Take 1 tablet (40 mg total) by mouth daily., Disp: 90 tablet, Rfl: 3 .  esomeprazole (NEXIUM) 40 MG capsule, Take 1 capsule (40 mg total) by mouth daily., Disp: 90 capsule, Rfl: 3 .  lisinopril (PRINIVIL,ZESTRIL) 10 MG tablet, Take 1 tablet (10 mg total) by mouth daily., Disp: 30 tablet, Rfl: 3 .  metoprolol tartrate (LOPRESSOR) 25 MG tablet, Take 1 tablet (25 mg total) by mouth 2 (two) times daily., Disp: 60 tablet, Rfl: 3 .  Multiple Vitamin (MULTIVITAMIN) tablet, Take 1 tablet by mouth daily., Disp: , Rfl:  .  traMADol (ULTRAM) 50 MG tablet, TAKE 1-2 TABLETS BY MOUTH EVERY 4 HOURS AS NEEDED FOR PAIN, Disp: , Rfl:   Past Medical History: Past Medical History:  Diagnosis Date  . Allergy    hay fever  . Hypercholesteremia   . Hypertension     Tobacco Use: History  Smoking Status  . Former Smoker  . Quit date: 08/11/2011  Smokeless Tobacco  . Never Used    Labs: Recent Review Flowsheet Data    Labs for ITP Cardiac and Pulmonary Rehab Latest Ref Rng &  Units 07/14/2016 07/14/2016 07/14/2016 07/14/2016 07/15/2016   Cholestrol 0 - 200 mg/dL - - - - -   LDLCALC 0 - 99 mg/dL - - - - -   HDL >40 mg/dL - - - - -   Trlycerides <150 mg/dL - - - - -   Hemoglobin A1c 4.8 - 5.6 % - - - - -   PHART 7.350 - 7.450 7.333(L) 7.346(L) 7.318(L) - -   PCO2ART 32.0 - 48.0 mmHg 47.6 44.7 44.7 - -   HCO3 20.0 - 28.0 mmol/L 25.5 24.7 23.2 - -   TCO2 0 - 100 mmol/L 27 26 25 24 28    ACIDBASEDEF 0.0 - 2.0 mmol/L 1.0 1.0 3.0(H) - -   O2SAT % 99.0 95.0 94.0 - -      Capillary Blood Glucose: Lab Results  Component Value Date   GLUCAP 100 (H) 07/16/2016   GLUCAP 83 07/16/2016   GLUCAP 105 (H) 07/15/2016   GLUCAP 131 (H) 07/15/2016   GLUCAP 123 (H) 07/15/2016     Exercise Target Goals: Date: (P) 08/18/16  Exercise Program Goal: Individual exercise prescription set with THRR, safety & activity barriers. Participant demonstrates ability to understand and report RPE using BORG scale, to self-measure pulse accurately, and to acknowledge the importance of the exercise prescription.  Exercise Prescription Goal: Starting with aerobic activity 30 plus minutes a day,  3 days per week for initial exercise prescription. Provide home exercise prescription and guidelines that participant acknowledges understanding prior to discharge.  Activity Barriers & Risk Stratification:     Activity Barriers & Cardiac Risk Stratification - 08/18/16 1151      Activity Barriers & Cardiac Risk Stratification   Activity Barriers Other (comment)   Comments B shoulder pain L>R   Cardiac Risk Stratification High      6 Minute Walk:     6 Minute Walk    Row Name 08/18/16 1149         6 Minute Walk   Phase Initial     Distance 1772 feet     Walk Time 6 minutes     # of Rest Breaks 0     MPH 3.36     METS 5.25     RPE 8     VO2 Peak 18.36     Symptoms No     Resting HR 61 bpm     Resting BP 128/96     Max Ex. HR 96 bpm     Max Ex. BP 142/98     2 Minute Post BP  126/96        Oxygen Initial Assessment:   Oxygen Re-Evaluation:   Oxygen Discharge (Final Oxygen Re-Evaluation):   Initial Exercise Prescription:     Initial Exercise Prescription - 08/18/16 1200      Date of Initial Exercise RX and Referring Provider   Date (P)  08/18/16   Referring Provider (P)  Everette Rank MD     Treadmill   MPH (P)  3.3   Grade (P)  22   Minutes (P)  10   METs (P)  4.4     Bike   Level (P)  1.3   Minutes (P)  10   METs (P)  4.07     NuStep   Level (P)  4   SPM (P)  90   Minutes (P)  10   METs (P)  3     Prescription Details   Frequency (times per week) (P)  3   Duration (P)  Progress to 30 minutes of continuous aerobic without signs/symptoms of physical distress     Intensity   THRR 40-80% of Max Heartrate (P)  68-137   Ratings of Perceived Exertion (P)  11-13   Perceived Dyspnea (P)  0-4     Progression   Progression (P)  Continue to progress workloads to maintain intensity without signs/symptoms of physical distress.     Resistance Training   Training Prescription (P)  Yes   Weight (P)  3lbs   Reps (P)  10-15      Perform Capillary Blood Glucose checks as needed.  Exercise Prescription Changes:   Exercise Comments:   Exercise Goals and Review:     Exercise Goals    Row Name 08/18/16 0859             Exercise Goals   Increase Physical Activity Yes       Intervention Provide advice, education, support and counseling about physical activity/exercise needs.;Develop an individualized exercise prescription for aerobic and resistive training based on initial evaluation findings, risk stratification, comorbidities and participant's personal goals.       Expected Outcomes Achievement of increased cardiorespiratory fitness and enhanced flexibility, muscular endurance and strength shown through measurements of functional capacity and personal statement of participant.       Increase Strength and Stamina Yes  Intervention Provide advice, education, support and counseling about physical activity/exercise needs.;Develop an individualized exercise prescription for aerobic and resistive training based on initial evaluation findings, risk stratification, comorbidities and participant's personal goals.       Expected Outcomes Achievement of increased cardiorespiratory fitness and enhanced flexibility, muscular endurance and strength shown through measurements of functional capacity and personal statement of participant.          Exercise Goals Re-Evaluation :    Discharge Exercise Prescription (Final Exercise Prescription Changes):   Nutrition:  Target Goals: Understanding of nutrition guidelines, daily intake of sodium 1500mg , cholesterol 200mg , calories 30% from fat and 7% or less from saturated fats, daily to have 5 or more servings of fruits and vegetables.  Biometrics:     Pre Biometrics - 08/18/16 1152      Pre Biometrics   Waist Circumference 36 inches   Hip Circumference 39 inches   Waist to Hip Ratio 0.92 %   Triceps Skinfold 15 mm   % Body Fat 23.2 %   Grip Strength 37 kg   Flexibility 8.5 in   Single Leg Stand 30 seconds       Nutrition Therapy Plan and Nutrition Goals:     Nutrition Therapy & Goals - 08/18/16 1505      Nutrition Therapy   Diet Therapeutic Lifestyle Changes     Personal Nutrition Goals   Nutrition Goal Maintain UBW range of 177-180 lb     Intervention Plan   Intervention Prescribe, educate and counsel regarding individualized specific dietary modifications aiming towards targeted core components such as weight, hypertension, lipid management, diabetes, heart failure and other comorbidities.   Expected Outcomes Short Term Goal: Understand basic principles of dietary content, such as calories, fat, sodium, cholesterol and nutrients.;Long Term Goal: Adherence to prescribed nutrition plan.      Nutrition Discharge: Nutrition Scores:     Nutrition  Assessments - 08/18/16 1505      MEDFICTS Scores   Pre Score --  Pt returned an incomplete survey      Nutrition Goals Re-Evaluation:   Nutrition Goals Re-Evaluation:   Nutrition Goals Discharge (Final Nutrition Goals Re-Evaluation):   Psychosocial: Target Goals: Acknowledge presence or absence of significant depression and/or stress, maximize coping skills, provide positive support system. Participant is able to verbalize types and ability to use techniques and skills needed for reducing stress and depression.  Initial Review & Psychosocial Screening:     Initial Psych Review & Screening - 08/18/16 1437      Initial Review   Current issues with Current Anxiety/Panic;Current Stress Concerns   Comments Pt desires to get back to where he was before he had his surgery.     Family Dynamics   Good Support System? Yes     Barriers   Psychosocial barriers to participate in program The patient should benefit from training in stress management and relaxation.      Quality of Life Scores:     Quality of Life - 08/18/16 1153      Quality of Life Scores   Health/Function Pre 26.14 %   Socioeconomic Pre 24.64 %   Psych/Spiritual Pre 26.25 %   Family Pre 26.4 %   GLOBAL Pre 25.88 %      PHQ-9: Recent Review Flowsheet Data    There is no flowsheet data to display.     Interpretation of Total Score  Total Score Depression Severity:  1-4 = Minimal depression, 5-9 = Mild depression, 10-14 = Moderate depression,  15-19 = Moderately severe depression, 20-27 = Severe depression   Psychosocial Evaluation and Intervention:   Psychosocial Re-Evaluation:   Psychosocial Discharge (Final Psychosocial Re-Evaluation):   Vocational Rehabilitation: Provide vocational rehab assistance to qualifying candidates.   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 Participant will state understanding/return demonstration of topics presented.  Learning Barriers/Preferences:     Learning Barriers/Preferences - 08/18/16 1147      Learning Barriers/Preferences   Learning Barriers Sight   Learning Preferences Skilled Demonstration      Education Topics: Count Your Pulse:  -Group instruction provided by verbal instruction, demonstration, patient participation and written materials to support subject.  Instructors address importance of being able to find your pulse and how to count your pulse when at home without a heart monitor.  Patients get hands on experience counting their pulse with staff help and individually.   Heart Attack, Angina, and Risk Factor Modification:  -Group instruction provided by verbal instruction, video, and written materials to support subject.  Instructors address signs and symptoms of angina and heart attacks.    Also discuss risk factors for heart disease and how to make changes to improve heart health risk factors.   Functional Fitness:  -Group instruction provided by verbal instruction, demonstration, patient participation, and written materials to support subject.  Instructors address safety measures for doing things around the house.  Discuss how to get up and down off the floor, how to pick things up properly, how to safely get out of a chair without assistance, and balance training.   Meditation and Mindfulness:  -Group instruction provided by verbal instruction, patient participation, and written materials to support subject.  Instructor addresses importance of mindfulness and meditation practice to help reduce stress and improve awareness.  Instructor also leads participants through a meditation  exercise.    Stretching for Flexibility and Mobility:  -Group instruction provided by verbal instruction, patient participation, and written materials to support subject.  Instructors lead participants through series of stretches that are designed to increase flexibility thus improving mobility.  These stretches are additional exercise for major muscle groups that are typically performed during regular warm up and cool down.   Hands Only CPR Anytime:  -Group instruction provided by verbal instruction, video, patient participation and written materials to support subject.  Instructors co-teach with AHA video for hands only CPR.  Participants get hands on experience with mannequins.   Nutrition I class: Heart Healthy Eating:  -Group instruction provided by PowerPoint slides, verbal discussion, and written materials to support subject matter. The instructor gives an explanation and review of the Therapeutic Lifestyle Changes diet recommendations, which includes a discussion on lipid goals, dietary fat, sodium, fiber, plant stanol/sterol esters, sugar, and the components of a well-balanced, healthy diet.   Nutrition II class: Lifestyle Skills:  -Group instruction provided by PowerPoint slides, verbal discussion, and written materials to support subject matter. The instructor gives an explanation and review of label reading, grocery shopping for heart health, heart healthy recipe modifications, and ways to make healthier choices when eating out.   Diabetes Question & Answer:  -Group instruction provided by PowerPoint slides, verbal discussion, and  written materials to support subject matter. The instructor gives an explanation and review of diabetes co-morbidities, pre- and post-prandial blood glucose goals, pre-exercise blood glucose goals, signs, symptoms, and treatment of hypoglycemia and hyperglycemia, and foot care basics.   Diabetes Blitz:  -Group instruction provided by PowerPoint slides,  verbal discussion, and written materials to support subject matter. The instructor gives an explanation and review of the physiology behind type 1 and type 2 diabetes, diabetes medications and rational behind using different medications, pre- and post-prandial blood glucose recommendations and Hemoglobin A1c goals, diabetes diet, and exercise including blood glucose guidelines for exercising safely.    Portion Distortion:  -Group instruction provided by PowerPoint slides, verbal discussion, written materials, and food models to support subject matter. The instructor gives an explanation of serving size versus portion size, changes in portions sizes over the last 20 years, and what consists of a serving from each food group.   Stress Management:  -Group instruction provided by verbal instruction, video, and written materials to support subject matter.  Instructors review role of stress in heart disease and how to cope with stress positively.     Exercising on Your Own:  -Group instruction provided by verbal instruction, power point, and written materials to support subject.  Instructors discuss benefits of exercise, components of exercise, frequency and intensity of exercise, and end points for exercise.  Also discuss use of nitroglycerin and activating EMS.  Review options of places to exercise outside of rehab.  Review guidelines for sex with heart disease.   Cardiac Drugs I:  -Group instruction provided by verbal instruction and written materials to support subject.  Instructor reviews cardiac drug classes: antiplatelets, anticoagulants, beta blockers, and statins.  Instructor discusses reasons, side effects, and lifestyle considerations for each drug class.   Cardiac Drugs II:  -Group instruction provided by verbal instruction and written materials to support subject.  Instructor reviews cardiac drug classes: angiotensin converting enzyme inhibitors (ACE-I), angiotensin II receptor blockers  (ARBs), nitrates, and calcium channel blockers.  Instructor discusses reasons, side effects, and lifestyle considerations for each drug class.   Anatomy and Physiology of the Circulatory System:  -Group instruction provided by verbal instruction, video, and written materials to support subject.  Reviews functional anatomy of heart, how it relates to various diagnoses, and what role the heart plays in the overall system.   Knowledge Questionnaire Score:     Knowledge Questionnaire Score - 08/18/16 1148      Knowledge Questionnaire Score   Pre Score 20/24      Core Components/Risk Factors/Patient Goals at Admission:     Personal Goals and Risk Factors at Admission - 08/18/16 0900      Core Components/Risk Factors/Patient Goals on Admission   Hypertension Yes   Intervention Provide education on lifestyle modifcations including regular physical activity/exercise, weight management, moderate sodium restriction and increased consumption of fresh fruit, vegetables, and low fat dairy, alcohol moderation, and smoking cessation.;Monitor prescription use compliance.   Expected Outcomes Short Term: Continued assessment and intervention until BP is < 140/4190mm HG in hypertensive participants. < 130/7080mm HG in hypertensive participants with diabetes, heart failure or chronic kidney disease.;Long Term: Maintenance of blood pressure at goal levels.   Lipids Yes   Intervention Provide education and support for participant on nutrition & aerobic/resistive exercise along with prescribed medications to achieve LDL 70mg , HDL >40mg .   Expected Outcomes Short Term: Participant states understanding of desired cholesterol values and is compliant with medications prescribed. Participant is following exercise prescription  and nutrition guidelines.;Long Term: Cholesterol controlled with medications as prescribed, with individualized exercise RX and with personalized nutrition plan. Value goals: LDL < 70mg , HDL > 40  mg.   Personal Goal Other Yes   Personal Goal short: decrease soreness in chest region   long: get back to work   Intervention Provide exercise programming to assist with increasing ROM, flexilbity and strength to assist with improving shoulder ROM and getting back to work and functional activities.   Expected Outcomes Pt will have decreased soreness and aches in chest/shoulder region and be able to return to work.      Core Components/Risk Factors/Patient Goals Review:    Core Components/Risk Factors/Patient Goals at Discharge (Final Review):    ITP Comments:     ITP Comments    Row Name 08/18/16 0856           ITP Comments Dr. Armanda Magic, Medical Director          Comments:  Pt in today for cardiac rehab orientation appointment from 0800 to 1000.  As a part of the orientation appt, pt completed warm up stretches and 6 minute walk test.  Pt tolerated well with no complaints of cp or sob.  Monitor showed SR with no noted ectopy.  Pt with elevation of pre exercise bp that decreased with rest and feet elevation. Pt prescribed on yesterday with Lisinopril 10 mg daily.  Pt had not picked up the prescription.  Planned to pick up on his way home.  Pt desired to change his class time to 8:15 which will be a good fit for this young patient.  Brief psychosocial assessment reveals some anxiety about his heart and overall physical condition from being so young having heart surgery.  Pt will benefit from group exercise and education classes specific to stress management and meditation. 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 Pt is looking forward to beginning full exercise next week. Alanson Aly, BSN Cardiac and Emergency planning/management officer

## 2016-08-22 ENCOUNTER — Encounter (HOSPITAL_COMMUNITY): Payer: BLUE CROSS/BLUE SHIELD

## 2016-08-24 ENCOUNTER — Encounter (HOSPITAL_COMMUNITY)
Admission: RE | Admit: 2016-08-24 | Discharge: 2016-08-24 | Disposition: A | Discharge status: Home / Self Care | Payer: BLUE CROSS/BLUE SHIELD | Source: Ambulatory Visit | Attending: Interventional Cardiology | Admitting: Interventional Cardiology

## 2016-08-24 ENCOUNTER — Encounter (HOSPITAL_COMMUNITY): Payer: Self-pay

## 2016-08-24 ENCOUNTER — Encounter (HOSPITAL_COMMUNITY): Payer: BLUE CROSS/BLUE SHIELD

## 2016-08-24 DIAGNOSIS — Z951 Presence of aortocoronary bypass graft: Secondary | ICD-10-CM

## 2016-08-24 DIAGNOSIS — I214 Non-ST elevation (NSTEMI) myocardial infarction: Secondary | ICD-10-CM | POA: Diagnosis not present

## 2016-08-24 NOTE — Progress Notes (Signed)
Daily Session Note  Patient Details  Name: Zachary Cuevas MRN: [id number] Date of Birth: 1966-03-26 Referring Provider:     CARDIAC REHAB PHASE II ORIENTATION from 08/18/2016 in [place]  Referring Provider  Mamie Nick) Casandra Doffing MD      Encounter Date: 08/24/2016  Check In:     Session Check In - 08/24/16 0901      Check-In   Location [facility]   Staff Present Cleda Mccreedy, MS, Exercise Physiologist;Amber Fair, MS, ACSM RCEP, Exercise Physiologist;Joann Rion, RN, Marga Melnick, RN, BSN   Supervising physician immediately available to respond to emergencies [facility] immediately available   Physician(s) Dr. Doyle Askew   Medication changes reported     No   Fall or balance concerns reported    No   Tobacco Cessation No Change   Warm-up and Cool-down Performed as group-led instruction   Resistance Training Performed No   VAD Patient? No     Pain Assessment   Currently in Pain? No/denies   Multiple Pain Sites No      Capillary Blood Glucose: No results found for this or any previous visit (from the past 24 hour(s)).    History  Smoking Status  . Former Smoker  . Quit date: 08/11/2011  Smokeless Tobacco  . Never Used    Goals Met:  Exercise tolerated well  Goals Unmet:  BP  Comments: Pt started cardiac rehab today.  Pt tolerated light exercise without difficulty. Pt hypertensive at rest, BP:  156/106 upon arrival to cardiac rehab.  Recheck  BP:  136/86.  BP normalized with exercise:  124/82.  Dr. Irish Lack made aware of BP results.   Telemetry- sinus rhythm. Pt asymptomatic, although he displays health related anxiety.  Pt has recently started lisinopril 16m 5 days ago.  Medication list reconciled. Pt denies barriers to medicaiton compliance.  PSYCHOSOCIAL ASSESSMENT:  PHQ-0.  .Marland KitchenPt exhibits health related anxiety, is unable to relax being on job disability recovering from his cardiac surgery.      Pt enjoys  watching TV and relaxing when not at work.  pt is looking forward to being able to return to his previous job. Pt goals for cardiac rehab are to resume his previous activities without difficulty, his job duties included pushing, pulling, walking and ladder climbing.     Pt oriented to exercise equipment and routine.    Understanding verbalized.   Dr. TFransico Himis Medical Director [facility]

## 2016-08-24 NOTE — Progress Notes (Signed)
Cardiac Individual Treatment Plan  Patient Details  Name: Zachary Cuevas Obst MRN: [id number] Date of Birth: 1966-03-23 Referring Provider:     CARDIAC REHAB PHASE II [facility]ATION [facility]18[facility]  Referring Provider  Demetrius Charity(P) Everette RankVaranasi, Jay MD      Initial Encounter Date:    CARDIAC REHAB PHASE II ORIENTATION [facility] 08[facility]18/2016 [facility]  Date  (P) 08/18/16  Referring Provider  Demetrius Charity(P) Everette RankVaranasi, Jay MD      Visit Diagnosis: 07/13/16 NSTEMI (non-ST elevated myocardial infarction) (HCC)  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  acetaminophen (TYLENOL) 325 MG tablet, Take 2 tablets (650 mg total) by mouth every 6 (six) hours as needed for mild pain., Disp: , Rfl:  .  aspirin EC 325 MG EC tablet, Take 1 tablet (325 mg total) by mouth daily., Disp: 30 tablet, Rfl: 0 .  atorvastatin (LIPITOR) 40 MG tablet, Take 1 tablet (40 mg total) by mouth daily., Disp: 90 tablet, Rfl: 3 .  esomeprazole (NEXIUM) 40 MG capsule, Take 1 capsule (40 mg total) by mouth daily., Disp: 90 capsule, Rfl: 3 .  lisinopril (PRINIVIL,ZESTRIL) 10 MG tablet, Take 1 tablet (10 mg total) by mouth daily., Disp: 30 tablet, Rfl: 3 .  metoprolol tartrate (LOPRESSOR) 25 MG tablet, Take 1 tablet (25 mg total) by mouth 2 (two) times daily., Disp: 60 tablet, Rfl: 3 .  Multiple Vitamin (MULTIVITAMIN) tablet, Take 1 tablet by mouth daily., Disp: , Rfl:  .  traMADol (ULTRAM) 50 MG tablet, TAKE 1-2 TABLETS BY MOUTH EVERY 4 HOURS AS NEEDED FOR PAIN, Disp: , Rfl:   Past Medical History: Past Medical History:  Diagnosis Date  . Allergy    hay fever  . Hypercholesteremia   . Hypertension     Tobacco Use: History  Smoking Status  . Former Smoker  . Quit date: 08/11/2011  Smokeless Tobacco  . Never Used    Labs: Recent Review Flowsheet Data    Labs for ITP Cardiac and Pulmonary Rehab Latest Ref Rng &  Units 07/14/2016 07/14/2016 07/14/2016 07/14/2016 07/15/2016   Cholestrol 0 - 200 mg/dL - - - - -   LDLCALC 0 - 99 mg/dL - - - - -   HDL >40>40 mg/dL - - - - -   Trlycerides <150 mg/dL - - - - -   Hemoglobin A1c 4.8 - 5.6 % - - - - -   PHART 7.350 - 7.450 7.333(L) 7.346(L) 7.318(L) - -   PCO2ART 32.0 - 48.0 mmHg 47.6 44.7 44.7 - -   HCO3 20.0 - 28.0 mmol/L 25.5 24.7 23.2 - -   TCO2 0 - 100 mmol/L 27 26 25 24 28    ACIDBASEDEF 0.0 - 2.0 mmol/L 1.0 1.0 3.0(H) - -   O2SAT % 99.0 95.0 94.0 - -      Capillary Blood Glucose: Lab Results  Component Value Date   GLUCAP 100 (H) 07/16/2016   GLUCAP 83 07/16/2016   GLUCAP 105 (H) 07/15/2016   GLUCAP 131 (H) 07/15/2016   GLUCAP 123 (H) 07/15/2016     Exercise Target Goals:    Exercise Program Goal: Individual exercise prescription set with THRR, safety & activity barriers. Participant demonstrates ability to understand and report RPE using BORG scale, to self-measure pulse accurately, and to acknowledge the importance of the exercise prescription.  Exercise Prescription Goal: Starting with aerobic activity 30 plus minutes a day, 3  days per week for initial exercise prescription. Provide home exercise prescription and guidelines that participant acknowledges understanding prior to discharge.  Activity Barriers & Risk Stratification:     Activity Barriers & Cardiac Risk Stratification - 08/18/16 1151      Activity Barriers & Cardiac Risk Stratification   Activity Barriers Other (comment)   Comments B shoulder pain L>R   Cardiac Risk Stratification High      6 Minute Walk:     6 Minute Walk    Row Name 08/18/16 1149         6 Minute Walk   Phase Initial     Distance 1772 feet     Walk Time 6 minutes     # of Rest Breaks 0     MPH 3.36     METS 5.25     RPE 8     VO2 Peak 18.36     Symptoms No     Resting HR 61 bpm     Resting BP 128/96     Max Ex. HR 96 bpm     Max Ex. BP 142/98     2 Minute Post BP 126/96         Oxygen Initial Assessment:   Oxygen Re-Evaluation:   Oxygen Discharge (Final Oxygen Re-Evaluation):   Initial Exercise Prescription:     Initial Exercise Prescription - 08/18/16 1200      Date of Initial Exercise RX and Referring Provider   Date (P)  08/18/16   Referring Provider (P)  Everette Rank MD     Treadmill   MPH (P)  3.3   Grade (P)  22   Minutes (P)  10   METs (P)  4.4     Bike   Level (P)  1.3   Minutes (P)  10   METs (P)  4.07     NuStep   Level (P)  4   SPM (P)  90   Minutes (P)  10   METs (P)  3     Prescription Details   Frequency (times per week) (P)  3   Duration (P)  Progress to 30 minutes of continuous aerobic without signs/symptoms of physical distress     Intensity   THRR 40-80% of Max Heartrate (P)  68-137   Ratings of Perceived Exertion (P)  11-13   Perceived Dyspnea (P)  0-4     Progression   Progression (P)  Continue to progress workloads to maintain intensity without signs/symptoms of physical distress.     Resistance Training   Training Prescription (P)  Yes   Weight (P)  3lbs   Reps (P)  10-15      Perform Capillary Blood Glucose checks as needed.  Exercise Prescription Changes:     Exercise Prescription Changes    Row Name 08/24/16 1000             Response to Exercise   Blood Pressure (Admit) 138/92       Blood Pressure (Exercise) 124/82       Blood Pressure (Exit) 136/96       Heart Rate (Admit) 79 bpm       Heart Rate (Exercise) 114 bpm       Heart Rate (Exit) 79 bpm       Rating of Perceived Exertion (Exercise) 11       Symptoms none       Comments 1st exercise session at cardiac rehab. Pt tolerated exercise very well  Duration Continue with 30 min of aerobic exercise without signs/symptoms of physical distress.       Intensity THRR unchanged         Progression   Progression Continue to progress workloads to maintain intensity without signs/symptoms of physical distress.       Average METs  3.8         Resistance Training   Training Prescription Yes       Weight 3lbs       Reps 10-15         Treadmill   MPH 3.3       Grade 22       Minutes 10       METs 4.4         Bike   Level 1.3       Minutes 10       METs 4.04         NuStep   Level 4       SPM 90       Minutes 10       METs 3.2          Exercise Comments:     Exercise Comments    Row Name 08/24/16 1046           Exercise Comments Pt completed first session of exercise in cardiac rehab and pt tolerated exercise very well; will continue to monitor pt's progress          Exercise Goals and Review:     Exercise Goals    Row Name 08/18/16 0859             Exercise Goals   Increase Physical Activity Yes       Intervention Provide advice, education, support and counseling about physical activity/exercise needs.;Develop an individualized exercise prescription for aerobic and resistive training based on initial evaluation findings, risk stratification, comorbidities and participant's personal goals.       Expected Outcomes Achievement of increased cardiorespiratory fitness and enhanced flexibility, muscular endurance and strength shown through measurements of functional capacity and personal statement of participant.       Increase Strength and Stamina Yes       Intervention Provide advice, education, support and counseling about physical activity/exercise needs.;Develop an individualized exercise prescription for aerobic and resistive training based on initial evaluation findings, risk stratification, comorbidities and participant's personal goals.       Expected Outcomes Achievement of increased cardiorespiratory fitness and enhanced flexibility, muscular endurance and strength shown through measurements of functional capacity and personal statement of participant.          Exercise Goals Re-Evaluation :    Discharge Exercise Prescription (Final Exercise Prescription Changes):     Exercise  Prescription Changes - 08/24/16 1000      Response to Exercise   Blood Pressure (Admit) 138/92   Blood Pressure (Exercise) 124/82   Blood Pressure (Exit) 136/96   Heart Rate (Admit) 79 bpm   Heart Rate (Exercise) 114 bpm   Heart Rate (Exit) 79 bpm   Rating of Perceived Exertion (Exercise) 11   Symptoms none   Comments 1st exercise session at cardiac rehab. Pt tolerated exercise very well   Duration Continue with 30 min of aerobic exercise without signs/symptoms of physical distress.   Intensity THRR unchanged     Progression   Progression Continue to progress workloads to maintain intensity without signs/symptoms of physical distress.   Average METs 3.8     Resistance Training  Training Prescription Yes   Weight 3lbs   Reps 10-15     Treadmill   MPH 3.3   Grade 22   Minutes 10   METs 4.4     Bike   Level 1.3   Minutes 10   METs 4.04     NuStep   Level 4   SPM 90   Minutes 10   METs 3.2      Nutrition:  Target Goals: Understanding of nutrition guidelines, daily intake of sodium 1500mg , cholesterol 200mg , calories 30% from fat and 7% or less from saturated fats, daily to have 5 or more servings of fruits and vegetables.  Biometrics:     Pre Biometrics - 08/18/16 1152      Pre Biometrics   Waist Circumference 36 inches   Hip Circumference 39 inches   Waist to Hip Ratio 0.92 %   Triceps Skinfold 15 mm   % Body Fat 23.2 %   Grip Strength 37 kg   Flexibility 8.5 in   Single Leg Stand 30 seconds       Nutrition Therapy Plan and Nutrition Goals:     Nutrition Therapy & Goals - 08/18/16 1505      Nutrition Therapy   Diet Therapeutic Lifestyle Changes     Personal Nutrition Goals   Nutrition Goal Maintain UBW range of 177-180 lb     Intervention Plan   Intervention Prescribe, educate and counsel regarding individualized specific dietary modifications aiming towards targeted core components such as weight, hypertension, lipid management, diabetes,  heart failure and other comorbidities.   Expected Outcomes Short Term Goal: Understand basic principles of dietary content, such as calories, fat, sodium, cholesterol and nutrients.;Long Term Goal: Adherence to prescribed nutrition plan.      Nutrition Discharge: Nutrition Scores:     Nutrition Assessments - 08/18/16 1505      MEDFICTS Scores   Pre Score --  Pt returned an incomplete survey      Nutrition Goals Re-Evaluation:   Nutrition Goals Re-Evaluation:   Nutrition Goals Discharge (Final Nutrition Goals Re-Evaluation):   Psychosocial: Target Goals: Acknowledge presence or absence of significant depression and/or stress, maximize coping skills, provide positive support system. Participant is able to verbalize types and ability to use techniques and skills needed for reducing stress and depression.  Initial Review & Psychosocial Screening:     Initial Psych Review & Screening - 08/18/16 1437      Initial Review   Current issues with Current Anxiety/Panic;Current Stress Concerns   Comments Pt desires to get back to where he was before he had his surgery.     Family Dynamics   Good Support System? Yes     Barriers   Psychosocial barriers to participate in program The patient should benefit from training in stress management and relaxation.      Quality of Life Scores:     Quality of Life - 08/18/16 1153      Quality of Life Scores   Health/Function Pre 26.14 %   Socioeconomic Pre 24.64 %   Psych/Spiritual Pre 26.25 %   Family Pre 26.4 %   GLOBAL Pre 25.88 %      PHQ-9: Recent Review Flowsheet Data    Depression screen [facility] 2/9 08/24/2016   Decreased Interest 0   Down, Depressed, Hopeless 0   PHQ - 2 Score 0     Interpretation of Total Score  Total Score Depression Severity:  1-4 = Minimal depression, 5-9 = Mild depression, 10-14 =  Moderate depression, 15-19 = Moderately severe depression, 20-27 = Severe depression   Psychosocial Evaluation and  Intervention:     Psychosocial Evaluation - 08/24/16 1014      Psychosocial Evaluation & Interventions   Interventions Encouraged to exercise with the program and follow exercise prescription;Stress management education;Relaxation education   Comments pt exhibits health related anxiety from recent cardiac surgery.  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     Expected Outcomes pt will display good coping skills with positive outlook and decreased health related anxiety.    Continue Psychosocial Services  Follow up required by staff      Psychosocial Re-Evaluation:   Psychosocial Discharge (Final Psychosocial Re-Evaluation):   Vocational Rehabilitation: Provide vocational rehab assistance to qualifying candidates.   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 Participant will state understanding/return demonstration of topics presented.  Learning Barriers/Preferences:     Learning Barriers/Preferences - 08/18/16 1147      Learning Barriers/Preferences   Learning Barriers Sight   Learning Preferences Skilled Demonstration      Education Topics: Count Your Pulse:  -Group instruction provided by verbal instruction, demonstration, patient participation and written materials to support subject.  Instructors address importance of being able to find your pulse and how to count your pulse when at home without a heart monitor.  Patients get hands on experience counting their pulse with staff help and individually.   Heart Attack, Angina, and Risk Factor  Modification:  -Group instruction provided by verbal instruction, video, and written materials to support subject.  Instructors address signs and symptoms of angina and heart attacks.    Also discuss risk factors for heart disease and how to make changes to improve heart health risk factors.   Functional Fitness:  -Group instruction provided by verbal instruction, demonstration, patient participation, and written materials to support subject.  Instructors address safety measures for doing things around the house.  Discuss how to get up and down off the floor, how to pick things up properly, how to safely get out of a chair without assistance, and balance training.   Meditation and Mindfulness:  -Group instruction provided by verbal instruction, patient participation, and written materials to support subject.  Instructor addresses importance of mindfulness and meditation practice to help reduce stress and improve awareness.  Instructor also leads participants through a meditation exercise.    Stretching for Flexibility and Mobility:  -Group instruction provided by verbal instruction, patient participation, and written materials to support subject.  Instructors lead participants through series of stretches that are designed to increase flexibility thus improving mobility.  These stretches are additional exercise for major muscle groups that are typically performed during regular warm up and cool down.   Hands Only CPR Anytime:  -Group instruction provided by verbal instruction, video, patient participation and written materials to support subject.  Instructors co-teach with AHA video for hands only CPR.  Participants get hands on experience with mannequins.   Nutrition I class: Heart Healthy Eating:  -Group instruction provided by PowerPoint slides, verbal discussion, and written materials to support subject  matter. The instructor gives an explanation and review of the Therapeutic Lifestyle  Changes diet recommendations, which includes a discussion on lipid goals, dietary fat, sodium, fiber, plant stanol/sterol esters, sugar, and the components of a well-balanced, healthy diet.   Nutrition II class: Lifestyle Skills:  -Group instruction provided by PowerPoint slides, verbal discussion, and written materials to support subject matter. The instructor gives an explanation and review of label reading, grocery shopping for heart health, heart healthy recipe modifications, and ways to make healthier choices when eating out.   Diabetes Question & Answer:  -Group instruction provided by PowerPoint slides, verbal discussion, and written materials to support subject matter. The instructor gives an explanation and review of diabetes co-morbidities, pre- and post-prandial blood glucose goals, pre-exercise blood glucose goals, signs, symptoms, and treatment of hypoglycemia and hyperglycemia, and foot care basics.   Diabetes Blitz:  -Group instruction provided by PowerPoint slides, verbal discussion, and written materials to support subject matter. The instructor gives an explanation and review of the physiology behind type 1 and type 2 diabetes, diabetes medications and rational behind using different medications, pre- and post-prandial blood glucose recommendations and Hemoglobin A1c goals, diabetes diet, and exercise including blood glucose guidelines for exercising safely.    Portion Distortion:  -Group instruction provided by PowerPoint slides, verbal discussion, written materials, and food models to support subject matter. The instructor gives an explanation of serving size versus portion size, changes in portions sizes over the last 20 years, and what consists of a serving from each food group.   Stress Management:  -Group instruction provided by verbal instruction, video, and written materials to support subject matter.  Instructors review role of stress in heart disease and how to cope  with stress positively.     Exercising on Your Own:  -Group instruction provided by verbal instruction, power point, and written materials to support subject.  Instructors discuss benefits of exercise, components of exercise, frequency and intensity of exercise, and end points for exercise.  Also discuss use of nitroglycerin and activating EMS.  Review options of places to exercise outside of rehab.  Review guidelines for sex with heart disease.   Cardiac Drugs I:  -Group instruction provided by verbal instruction and written materials to support subject.  Instructor reviews cardiac drug classes: antiplatelets, anticoagulants, beta blockers, and statins.  Instructor discusses reasons, side effects, and lifestyle considerations for each drug class.   Cardiac Drugs II:  -Group instruction provided by verbal instruction and written materials to support subject.  Instructor reviews cardiac drug classes: angiotensin converting enzyme inhibitors (ACE-I), angiotensin II receptor blockers (ARBs), nitrates, and calcium channel blockers.  Instructor discusses reasons, side effects, and lifestyle considerations for each drug class.   Anatomy and Physiology of the Circulatory System:  -Group instruction provided by verbal instruction, video, and written materials to support subject.  Reviews functional anatomy of heart, how it relates to various diagnoses, and what role the heart plays in the overall system.   Knowledge Questionnaire Score:     Knowledge Questionnaire Score - 08/18/16 1148      Knowledge Questionnaire Score   Pre Score 20/24      Core Components/Risk Factors/Patient Goals at Admission:     Personal Goals and Risk Factors at Admission - 08/18/16 0900      Core Components/Risk Factors/Patient Goals on Admission   Hypertension Yes   Intervention Provide education on lifestyle modifcations including regular physical activity/exercise, weight management, moderate sodium  restriction and increased consumption of fresh fruit,  vegetables, and low fat dairy, alcohol moderation, and smoking cessation.;Monitor prescription use compliance.   Expected Outcomes Short Term: Continued assessment and intervention until BP is < 140/44mm HG in hypertensive participants. < 130/29mm HG in hypertensive participants with diabetes, heart failure or chronic kidney disease.;Long Term: Maintenance of blood pressure at goal levels.   Lipids Yes   Intervention Provide education and support for participant on nutrition & aerobic/resistive exercise along with prescribed medications to achieve LDL 70mg , HDL >40mg .   Expected Outcomes Short Term: Participant states understanding of desired cholesterol values and is compliant with medications prescribed. Participant is following exercise prescription and nutrition guidelines.;Long Term: Cholesterol controlled with medications as prescribed, with individualized exercise RX and with personalized nutrition plan. Value goals: LDL < 70mg , HDL > 40 mg.   Personal Goal Other Yes   Personal Goal short: decrease soreness in chest region   long: get back to work   Intervention Provide exercise programming to assist with increasing ROM, flexilbity and strength to assist with improving shoulder ROM and getting back to work and functional activities.   Expected Outcomes Pt will have decreased soreness and aches in chest/shoulder region and be able to return to work.      Core Components/Risk Factors/Patient Goals Review:    Core Components/Risk Factors/Patient Goals at Discharge (Final Review):    ITP Comments:     ITP Comments    Row Name 08/18/16 0856           ITP Comments Dr. Armanda Magic, Medical Director          Comments: Pt is making expected progress toward personal goals after completing 2 sessions. Recommend continued exercise and life style modification education including  stress management and relaxation techniques to decrease  cardiac risk profile.

## 2016-08-26 ENCOUNTER — Telehealth (HOSPITAL_COMMUNITY): Payer: Self-pay | Admitting: Cardiac Rehabilitation

## 2016-08-26 ENCOUNTER — Encounter (HOSPITAL_COMMUNITY)
Admission: RE | Admit: 2016-08-26 | Discharge: 2016-08-26 | Disposition: A | Discharge status: Home / Self Care | Payer: BLUE CROSS/BLUE SHIELD | Source: Ambulatory Visit | Attending: Interventional Cardiology | Admitting: Interventional Cardiology

## 2016-08-26 ENCOUNTER — Encounter (HOSPITAL_COMMUNITY): Payer: BLUE CROSS/BLUE SHIELD

## 2016-08-26 DIAGNOSIS — Z951 Presence of aortocoronary bypass graft: Secondary | ICD-10-CM | POA: Diagnosis not present

## 2016-08-26 DIAGNOSIS — I214 Non-ST elevation (NSTEMI) myocardial infarction: Secondary | ICD-10-CM | POA: Diagnosis not present

## 2016-08-26 NOTE — Telephone Encounter (Signed)
-----   Message from Loreli Slot, MD sent at 08/26/2016 10:17 AM EDT ----- Regarding: RE: cardiac rehab  4/15  [facility] ----- Message ----- From: Robyne Peers, RN Sent: 08/26/2016   9:41 AM To: Loreli Slot, MD Subject: cardiac rehab                                  Dear Dr. Dorris Fetch  Pt s/p CABG 07/14/2016.  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  Thank you, Zachary Furlong, RN, BSN Cardiac Pulmonary Rehab

## 2016-08-26 NOTE — Telephone Encounter (Signed)
Pt informed.  Understanding verbalized 

## 2016-08-26 NOTE — Telephone Encounter (Signed)
-----   Message from Corky Crafts, MD sent at 08/25/2016  1:26 PM EDT ----- Regarding: RE: cardiac rehab  COntinue to monitor.  JV ----- Message ----- From: Robyne Peers, RN Sent: 08/24/2016  10:24 AM To: Corky Crafts, MD Subject: cardiac rehab                                  Dear Dr. Eldridge Dace  Pt started cardiac rehab today.  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    He is hypertensive at cardiac rehab today.  BP:  156/106 at rest upon arrival to rehab.  Recheck BP:  136/86.  Fortunately, exercise values are much better-118/82, 122/80, 124/82 HR:  79-114.  Pt recently started lisinopril  on 08/19/16 per Dr. Dorris Fetch.    Please advise.  Thank you, Deveron Furlong, RN, BSN Cardiac Pulmonary Rehab

## 2016-08-29 ENCOUNTER — Encounter (HOSPITAL_COMMUNITY): Payer: BLUE CROSS/BLUE SHIELD

## 2016-08-29 ENCOUNTER — Encounter (HOSPITAL_COMMUNITY)
Admission: RE | Admit: 2016-08-29 | Discharge: 2016-08-29 | Disposition: A | Discharge status: Home / Self Care | Payer: BLUE CROSS/BLUE SHIELD | Source: Ambulatory Visit | Attending: Interventional Cardiology | Admitting: Interventional Cardiology

## 2016-08-29 DIAGNOSIS — I214 Non-ST elevation (NSTEMI) myocardial infarction: Secondary | ICD-10-CM | POA: Diagnosis not present

## 2016-08-29 DIAGNOSIS — Z951 Presence of aortocoronary bypass graft: Secondary | ICD-10-CM

## 2016-08-29 NOTE — Progress Notes (Signed)
Reviewed home exercise with pt today.  Pt plans to walk for exercise, 2x/week in addition to coming to cardiac rehab.  Reviewed THR, pulse, RPE, sign and symptoms, and when to call 911 or MD.  Also discussed weather considerations and indoor options.  Pt voiced understanding.   Nikeisha Klutz,MS, ACSM RCEP 

## 2016-08-31 ENCOUNTER — Encounter (HOSPITAL_COMMUNITY)
Admission: RE | Admit: 2016-08-31 | Discharge: 2016-08-31 | Disposition: A | Discharge status: Home / Self Care | Payer: BLUE CROSS/BLUE SHIELD | Source: Ambulatory Visit | Attending: Interventional Cardiology | Admitting: Interventional Cardiology

## 2016-08-31 ENCOUNTER — Encounter (HOSPITAL_COMMUNITY): Payer: BLUE CROSS/BLUE SHIELD

## 2016-08-31 DIAGNOSIS — Z951 Presence of aortocoronary bypass graft: Secondary | ICD-10-CM

## 2016-08-31 DIAGNOSIS — I214 Non-ST elevation (NSTEMI) myocardial infarction: Secondary | ICD-10-CM

## 2016-09-02 ENCOUNTER — Encounter (HOSPITAL_COMMUNITY)
Admission: RE | Admit: 2016-09-02 | Discharge: 2016-09-02 | Disposition: A | Discharge status: Home / Self Care | Payer: BLUE CROSS/BLUE SHIELD | Source: Ambulatory Visit | Attending: Interventional Cardiology | Admitting: Interventional Cardiology

## 2016-09-02 ENCOUNTER — Encounter (HOSPITAL_COMMUNITY): Payer: BLUE CROSS/BLUE SHIELD

## 2016-09-02 DIAGNOSIS — I214 Non-ST elevation (NSTEMI) myocardial infarction: Secondary | ICD-10-CM | POA: Diagnosis not present

## 2016-09-02 DIAGNOSIS — Z951 Presence of aortocoronary bypass graft: Secondary | ICD-10-CM

## 2016-09-05 ENCOUNTER — Encounter (HOSPITAL_COMMUNITY): Payer: BLUE CROSS/BLUE SHIELD

## 2016-09-05 ENCOUNTER — Encounter (HOSPITAL_COMMUNITY)
Admission: RE | Admit: 2016-09-05 | Discharge: 2016-09-05 | Disposition: A | Discharge status: Home / Self Care | Payer: BLUE CROSS/BLUE SHIELD | Source: Ambulatory Visit | Attending: Interventional Cardiology | Admitting: Interventional Cardiology

## 2016-09-05 DIAGNOSIS — Z951 Presence of aortocoronary bypass graft: Secondary | ICD-10-CM | POA: Diagnosis not present

## 2016-09-05 DIAGNOSIS — I214 Non-ST elevation (NSTEMI) myocardial infarction: Secondary | ICD-10-CM

## 2016-09-05 NOTE — Progress Notes (Signed)
Zachary Cuevas 51 y.o. male Nutrition Note Spoke with pt. Nutrition Survey reviewed with pt. Pt is not currently following the Therapeutic Lifestyle Changes diet. Pt has has decreased regular coke from 6 per day to 2 per day. According to pt's last A1c, pt is pre-diabetic. Pre-diabetes discussed. Pt encouraged to discuss pre-diabetes further with his PCP. Pt declined information on pre-diabetes at this time. Pt expressed understanding of the information reviewed. Pt aware of nutrition education classes offered and is unable to attend nutrition classes. Lab Results  Component Value Date   HGBA1C 5.7 (H) 07/13/2016   Wt Readings from Last 3 Encounters:  08/18/16 177 lb 14.6 oz (80.7 kg)  08/16/16 172 lb (78 kg)  07/29/16 172 lb 6.4 oz (78.2 kg)   Nutrition Diagnosis ? Food-and nutrition-related knowledge deficit related to lack of exposure to information as related to diagnosis of: ? CVD ? Pre-DM Nutrition Intervention ? Benefits of adopting Therapeutic Lifestyle Changes discussed when Medficts reviewed. ? Pt to attend the Portion Distortion class ? Pt given handouts for: ? Nutrition I class ? Nutrition II class ? Continue client-centered nutrition education by RD, as part of interdisciplinary care.  Goal(s) ? Pt to identify and limit food sources of saturated fat, trans fat, and sodium by: a. Choosing more whole grain breads. b. Choosing nuts and nut butters more often. c. Going easy on high fat cheeses. d. Choosing heart healthy oil, trans fat-free margarine, and heart healthy salad dressings/mayo e. Watching out for sweets and added sugar. f. Going easy on snack chips and crackers. g. Choosing healthy drinks.  Monitor and Evaluate progress toward nutrition goal with team.  Mickle Plumb, M.Ed, RD, LDN, CDE 09/05/2016 9:30 AM

## 2016-09-07 ENCOUNTER — Encounter (HOSPITAL_COMMUNITY)
Admission: RE | Admit: 2016-09-07 | Discharge: 2016-09-07 | Disposition: A | Discharge status: Home / Self Care | Payer: BLUE CROSS/BLUE SHIELD | Source: Ambulatory Visit | Attending: Interventional Cardiology | Admitting: Interventional Cardiology

## 2016-09-07 ENCOUNTER — Encounter (HOSPITAL_COMMUNITY): Payer: BLUE CROSS/BLUE SHIELD

## 2016-09-07 DIAGNOSIS — I214 Non-ST elevation (NSTEMI) myocardial infarction: Secondary | ICD-10-CM

## 2016-09-07 DIAGNOSIS — Z951 Presence of aortocoronary bypass graft: Secondary | ICD-10-CM | POA: Diagnosis not present

## 2016-09-09 ENCOUNTER — Encounter (HOSPITAL_COMMUNITY): Payer: BLUE CROSS/BLUE SHIELD

## 2016-09-09 ENCOUNTER — Encounter (HOSPITAL_COMMUNITY)
Admission: RE | Admit: 2016-09-09 | Discharge: 2016-09-09 | Disposition: A | Discharge status: Home / Self Care | Payer: BLUE CROSS/BLUE SHIELD | Source: Ambulatory Visit | Attending: Interventional Cardiology | Admitting: Interventional Cardiology

## 2016-09-09 DIAGNOSIS — I214 Non-ST elevation (NSTEMI) myocardial infarction: Secondary | ICD-10-CM

## 2016-09-09 DIAGNOSIS — Z951 Presence of aortocoronary bypass graft: Secondary | ICD-10-CM

## 2016-09-12 ENCOUNTER — Encounter (HOSPITAL_COMMUNITY): Payer: BLUE CROSS/BLUE SHIELD

## 2016-09-12 ENCOUNTER — Telehealth (HOSPITAL_COMMUNITY): Payer: Self-pay | Admitting: Cardiac Rehabilitation

## 2016-09-12 NOTE — Telephone Encounter (Signed)
-----   Message from Loreli Slot, MD sent at 09/09/2016  2:51 PM EDT ----- Regarding: RE: cardiac rehab  Yes  [facility] ----- Message ----- From: Robyne Peers, RN Sent: 09/09/2016   7:28 AM To: Loreli Slot, MD Subject: cardiac rehab                                  Dear Dr. Dorris Fetch,  Pt is eager to return to work light duty.  He is tolerating cardiac rehab without difficulty.  Average METS:  4-5.  He continues to be hypertensive with higher workloads, primarily the bike.  (highest BP: 182/102, average 130-140s/80s with exertion, 120/80s at rest).  Do you feel it is appropriate for him to return to work, light duty?  Thank you, Deveron Furlong, RN, BSN Cardiac Pulmonary Rehab

## 2016-09-14 ENCOUNTER — Encounter (HOSPITAL_COMMUNITY): Payer: BLUE CROSS/BLUE SHIELD

## 2016-09-14 ENCOUNTER — Encounter (HOSPITAL_COMMUNITY)
Admission: RE | Admit: 2016-09-14 | Discharge: 2016-09-14 | Disposition: A | Discharge status: Home / Self Care | Payer: BLUE CROSS/BLUE SHIELD | Source: Ambulatory Visit | Attending: Interventional Cardiology | Admitting: Interventional Cardiology

## 2016-09-14 DIAGNOSIS — I214 Non-ST elevation (NSTEMI) myocardial infarction: Secondary | ICD-10-CM

## 2016-09-14 DIAGNOSIS — Z951 Presence of aortocoronary bypass graft: Secondary | ICD-10-CM | POA: Diagnosis not present

## 2016-09-16 ENCOUNTER — Encounter (HOSPITAL_COMMUNITY): Payer: Self-pay

## 2016-09-16 ENCOUNTER — Encounter (HOSPITAL_COMMUNITY)
Admission: RE | Admit: 2016-09-16 | Discharge: 2016-09-16 | Disposition: A | Discharge status: Home / Self Care | Payer: BLUE CROSS/BLUE SHIELD | Source: Ambulatory Visit | Attending: Interventional Cardiology | Admitting: Interventional Cardiology

## 2016-09-16 ENCOUNTER — Encounter (HOSPITAL_COMMUNITY): Payer: BLUE CROSS/BLUE SHIELD

## 2016-09-16 VITALS — Ht 72.5 in | Wt 176.8 lb

## 2016-09-16 DIAGNOSIS — I214 Non-ST elevation (NSTEMI) myocardial infarction: Secondary | ICD-10-CM | POA: Diagnosis not present

## 2016-09-16 DIAGNOSIS — Z951 Presence of aortocoronary bypass graft: Secondary | ICD-10-CM

## 2016-09-16 NOTE — Progress Notes (Signed)
Discharge Summary  Patient Details  Name: Zachary Cuevas MRN: [id number] Date of Birth: 04-20-1966 Referring Provider:     CARDIAC REHAB PHASE II ORIENTATION from 08/18/2016 in [facility] CARDIAC REHAB  Referring Provider  Demetrius Charity) Everette Rank MD       Number of Visits:  11  Reason for Discharge:  Early Exit:  Back to work:  Pt plans to exercise on his own, walking at home.    Smoking History:  History  Smoking Status  . Former Smoker  . Quit date: 08/11/2011  Smokeless Tobacco  . 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  ADL UCSD:   Initial Exercise Prescription:     Initial Exercise Prescription - 08/18/16 1200      Date of Initial Exercise RX and Referring Provider   Date (P)  08/18/16   Referring Provider Demetrius Charity)  Everette Rank MD     Treadmill   MPH (P)  3.3   Grade (P)  22   Minutes (P)  10   METs (P)  4.4     Bike   Level (P)  1.3   Minutes (P)  10   METs (P)  4.07     NuStep   Level (P)  4   SPM (P)  90   Minutes (P)  10   METs (P)  3     Prescription Details   Frequency (times per week) (P)  3   Duration (P)  Progress to 30 minutes of continuous aerobic without signs/symptoms of physical distress     Intensity   THRR 40-80% of Max Heartrate (P)  68-137   Ratings of Perceived Exertion (P)  11-13   Perceived Dyspnea (P)  0-4     Progression   Progression (P)  Continue to progress workloads to maintain intensity without signs/symptoms of physical distress.     Resistance Training   Training Prescription (P)  Yes   Weight (P)  3lbs   Reps (P)  10-15      Discharge Exercise Prescription (Final Exercise Prescription Changes):     Exercise Prescription Changes - 09/16/16 1706      Response to Exercise   Blood Pressure (Admit) 144/80   Blood Pressure (Exercise) 160/94   Blood Pressure (Exit) 140/100  132/98   Heart Rate (Admit) 71 bpm   Heart Rate  (Exercise) 104 bpm   Heart Rate (Exit) 64 bpm   Rating of Perceived Exertion (Exercise) 11   Symptoms none   Duration Continue with 30 min of aerobic exercise without signs/symptoms of physical distress.   Intensity THRR unchanged     Progression   Progression Continue to progress workloads to maintain intensity without signs/symptoms of physical distress.   Average METs 5.1     Resistance Training   Training Prescription Yes   Weight 5lbs   Reps 10-15   Time 10 Minutes     Treadmill   MPH 3.5   Grade 4   Minutes 10   METs 5.61     Bike   Level 1.3   Minutes 10   METs 4.06     NuStep   Level 5   SPM 90   Minutes 10   METs 4.2     Home Exercise Plan   Plans to continue exercise at Home (comment)  walking   Frequency Add 2 additional days to program exercise sessions.  Initial Home Exercises Provided 08/29/16      Functional Capacity:     6 Minute Walk    Row Name 08/18/16 1149 09/28/16 1704       6 Minute Walk   Phase Initial Discharge    Distance 1772 feet 2000 feet    Distance % Change  - 12.87 %    Walk Time 6 minutes 6 minutes    # of Rest Breaks 0 0    MPH 3.36 3.78    METS 5.25 5.67    RPE 8 11    VO2 Peak 18.36 19.85    Symptoms No No    Resting HR 61 bpm 61 bpm    Resting BP 128/96 144/80    Max Ex. HR 96 bpm 99 bpm    Max Ex. BP 142/98 140/100  144/100    2 Minute Post BP 126/96 132/98       Psychological, QOL, Others - Outcomes: PHQ 2/9: Depression screen [facility] 2/9 09/16/2016 08/24/2016  Decreased Interest 0 0  Down, Depressed, Hopeless 0 0  PHQ - 2 Score 0 0    Quality of Life:     Quality of Life - 09/28/16 1754      Quality of Life Scores   Health/Function Pre 26.14 %   Health/Function Post 26.18 %   Health/Function % Change 0.15 %   Socioeconomic Pre 24.64 %   Socioeconomic Post 23.43 %   Socioeconomic % Change  -4.91 %   Psych/Spiritual Pre 26.25 %   Psych/Spiritual Post 24 %   Psych/Spiritual % Change -8.57 %    Family Pre 26.4 %   Family Post 27.6 %   Family % Change 4.55 %   GLOBAL Pre 25.88 %   GLOBAL Post 25.39 %   GLOBAL % Change -1.89 %      Personal Goals: Goals established at orientation with interventions provided to work toward goal.     Personal Goals and Risk Factors at Admission - 09/28/16 1711      Core Components/Risk Factors/Patient Goals on Admission   Personal Goal Pt did reach personal goals and plans to return to work on 09/19/16   Expected Outcomes Pt will be able to work and make lifestyle changes to help with reducing CVD risk factors.       Personal Goals Discharge:     Goals and Risk Factor Review    Row Name 09/16/16 1018             Core Components/Risk Factors/Patient Goals Review   Personal Goals Review Hypertension;Lipids       Review pt feels he has better understanding of lifestyle modification to reduce risk factors. pt has been instructed to continue nutrition, education, medication compliance and regular physician followup.  pt also instructed to continue to monitor home BP and report hypertensive findings with his cardiologist.  pt verbalized understanding.        Expected Outcomes pt will continue lifestyle modifications to reduce CAD risk factors as well as hypertension management.            Nutrition & Weight - Outcomes:     Pre Biometrics - 08/18/16 1152      Pre Biometrics   Waist Circumference 36 inches   Hip Circumference 39 inches   Waist to Hip Ratio 0.92 %   Triceps Skinfold 15 mm   % Body Fat 23.2 %   Grip Strength 37 kg   Flexibility 8.5 in   Single  Leg Stand 30 seconds         Post Biometrics - 09/28/16 1711       Post  Biometrics   Height 6' 0.5" (1.842 m)   Weight 176 lb 12.9 oz (80.2 kg)   Waist Circumference 36 inches   Hip Circumference 39 inches   Waist to Hip Ratio 0.92 %   BMI (Calculated) 23.7   Triceps Skinfold 15 mm   % Body Fat 23.2 %   Grip Strength 47 kg   Flexibility 15 in   Single Leg Stand  30 seconds      Nutrition:     Nutrition Therapy & Goals - 09/05/16 0940      Nutrition Therapy   Diet Therapeutic Lifestyle Changes     Personal Nutrition Goals   Nutrition Goal Maintain UBW range of 177-180 lb   Personal Goal #2 Pt to identify and limit sources of saturated fat, trans fat, and sodium     Intervention Plan   Intervention Prescribe, educate and counsel regarding individualized specific dietary modifications aiming towards targeted core components such as weight, hypertension, lipid management, diabetes, heart failure and other comorbidities.   Expected Outcomes Short Term Goal: Understand basic principles of dietary content, such as calories, fat, sodium, cholesterol and nutrients.;Long Term Goal: Adherence to prescribed nutrition plan.      Nutrition Discharge:     Nutrition Assessments - 09/05/16 0940      MEDFICTS Scores   Pre Score 122      Education Questionnaire Score:     Knowledge Questionnaire Score - 09/28/16 1754      Knowledge Questionnaire Score   Post Score 21/24      Goals reviewed with patient; copy given to patient.

## 2016-09-19 ENCOUNTER — Encounter (HOSPITAL_COMMUNITY): Payer: BLUE CROSS/BLUE SHIELD

## 2016-09-21 ENCOUNTER — Encounter (HOSPITAL_COMMUNITY): Payer: BLUE CROSS/BLUE SHIELD

## 2016-09-23 ENCOUNTER — Encounter (HOSPITAL_COMMUNITY): Payer: BLUE CROSS/BLUE SHIELD

## 2016-09-25 ENCOUNTER — Emergency Department (HOSPITAL_COMMUNITY)
Admission: EM | Admit: 2016-09-25 | Discharge: 2016-09-26 | Disposition: A | Discharge status: Home / Self Care | Payer: BLUE CROSS/BLUE SHIELD | Attending: Emergency Medicine | Admitting: Emergency Medicine

## 2016-09-25 ENCOUNTER — Emergency Department (HOSPITAL_COMMUNITY): Payer: BLUE CROSS/BLUE SHIELD

## 2016-09-25 ENCOUNTER — Encounter (HOSPITAL_COMMUNITY): Payer: Self-pay | Admitting: Emergency Medicine

## 2016-09-25 DIAGNOSIS — R079 Chest pain, unspecified: Secondary | ICD-10-CM

## 2016-09-25 DIAGNOSIS — I2510 Atherosclerotic heart disease of native coronary artery without angina pectoris: Secondary | ICD-10-CM | POA: Insufficient documentation

## 2016-09-25 DIAGNOSIS — Z8679 Personal history of other diseases of the circulatory system: Secondary | ICD-10-CM

## 2016-09-25 DIAGNOSIS — Z7982 Long term (current) use of aspirin: Secondary | ICD-10-CM | POA: Diagnosis not present

## 2016-09-25 DIAGNOSIS — R0789 Other chest pain: Secondary | ICD-10-CM | POA: Diagnosis not present

## 2016-09-25 DIAGNOSIS — Z951 Presence of aortocoronary bypass graft: Secondary | ICD-10-CM | POA: Diagnosis not present

## 2016-09-25 DIAGNOSIS — Z87891 Personal history of nicotine dependence: Secondary | ICD-10-CM | POA: Insufficient documentation

## 2016-09-25 DIAGNOSIS — Z79899 Other long term (current) drug therapy: Secondary | ICD-10-CM | POA: Insufficient documentation

## 2016-09-25 DIAGNOSIS — I252 Old myocardial infarction: Secondary | ICD-10-CM | POA: Diagnosis not present

## 2016-09-25 DIAGNOSIS — I10 Essential (primary) hypertension: Secondary | ICD-10-CM | POA: Insufficient documentation

## 2016-09-25 LAB — I-STAT TROPONIN, ED: TROPONIN I, POC: 0 ng/mL (ref 0.00–0.08)

## 2016-09-25 LAB — CBC
HCT: 39.2 % (ref 39.0–52.0)
Hemoglobin: 12.9 g/dL — ABNORMAL LOW (ref 13.0–17.0)
MCH: 26.6 pg (ref 26.0–34.0)
MCHC: 32.9 g/dL (ref 30.0–36.0)
MCV: 80.8 fL (ref 78.0–100.0)
PLATELETS: 205 10*3/uL (ref 150–400)
RBC: 4.85 MIL/uL (ref 4.22–5.81)
RDW: 13.2 % (ref 11.5–15.5)
WBC: 9.8 10*3/uL (ref 4.0–10.5)

## 2016-09-25 LAB — BASIC METABOLIC PANEL
Anion gap: 9 (ref 5–15)
BUN: 19 mg/dL (ref 6–20)
CALCIUM: 8.8 mg/dL — AB (ref 8.9–10.3)
CO2: 24 mmol/L (ref 22–32)
CREATININE: 1 mg/dL (ref 0.61–1.24)
Chloride: 102 mmol/L (ref 101–111)
GFR calc Af Amer: >60 mL/min (ref >60)
GLUCOSE: 160 mg/dL — AB (ref 65–99)
Potassium: 4.1 mmol/L (ref 3.5–5.1)
Sodium: 135 mmol/L (ref 135–145)

## 2016-09-25 NOTE — ED Notes (Signed)
Back from xray

## 2016-09-25 NOTE — ED Triage Notes (Signed)
Patient arrives with complaint of central chest pain. Onset 1 hour ago while watching TV. Recent CABG x2 on Feb 15th. Recently cleared to return to work. Only other symptom endorsed at this time is tightness in bilateral arms. States "feels like I have been lifting weights all day". Denies back pain, nausea, dizziness, SOB, fever. Endorses cough.

## 2016-09-25 NOTE — ED Notes (Signed)
Taken to xray at this time. 

## 2016-09-25 NOTE — ED Provider Notes (Signed)
[facility]-EMERGENCY DEPT Provider Note   CSN: [id number] Arrival date & time: 09/25/16  2241  By signing my name below, I, Diona Browner, attest that this documentation has been prepared under the direction and in the presence of Geoffery Lyons, MD. Electronically Signed: Diona Browner, ED Scribe. 09/25/16. 11:44 PM.   History   Chief Complaint Chief Complaint  Patient presents with  . Chest Pain    HPI Zachary Cuevas is a 51 y.o. male who presents to the Emergency Department complaining of gradually improving chest pain that started ~ 2 hours ago. Pt was watching TV when he started feeling tightness in his bilateral arms and tightness in his chest. "Feels like I have been lifting weights all day." He describes pain feels like really bad heart burn. Recent CABG x2 on 07/14/16. He recently went back to work. Similar sx as previous onset, just not as severe. Family h/o of heart disease. Pt denies nausea, vomiting, back pain, SOB, and fever.   The history is provided by the patient. No language interpreter was used.  Chest Pain   This is a recurrent problem. The current episode started 1 to 2 hours ago. The pain radiates to the right arm and left arm. Pertinent negatives include no back pain, no fever, no nausea, no shortness of breath and no vomiting. He has tried nothing for the symptoms.  His past medical history is significant for hypertension.    Past Medical History:  Diagnosis Date  . Allergy    hay fever  . Hypercholesteremia   . Hypertension     Patient Active Problem List   Diagnosis Date Noted  . S/P CABG x 2   . Coronary artery disease 07/14/2016  . Non-STEMI (non-ST elevated myocardial infarction) (HCC) 07/13/2016  . Allergy     Past Surgical History:  Procedure Laterality Date  . CORONARY ARTERY BYPASS GRAFT N/A 07/14/2016   Procedure: CORONARY ARTERY BYPASS GRAFTING (CABG) x 2 WITH BILATERAL IMA;  Surgeon: Loreli Slot, MD;  Location: [facility] OR;  Service: Open  Heart Surgery;  Laterality: N/A;  . CORONARY BALLOON ANGIOPLASTY N/A 07/13/2016   Procedure: Coronary Balloon Angioplasty;  Surgeon: Iran Ouch, MD;  Location: [facility] INVASIVE CV LAB;  Service: Cardiovascular;  Laterality: N/A;  . FRACTURE SURGERY  2005   boxer fracture  of hands  . LEFT HEART CATH AND CORONARY ANGIOGRAPHY N/A 07/13/2016   Procedure: Left Heart Cath and Coronary Angiography;  Surgeon: Iran Ouch, MD;  Location: [facility] INVASIVE CV LAB;  Service: Cardiovascular;  Laterality: N/A;  . TEE WITHOUT CARDIOVERSION N/A 07/14/2016   Procedure: TRANSESOPHAGEAL ECHOCARDIOGRAM (TEE);  Surgeon: Loreli Slot, MD;  Location: [facility] OR;  Service: Open Heart Surgery;  Laterality: N/A;       Home Medications    Prior to Admission medications   Medication Sig Start Date End Date Taking? Authorizing Provider  acetaminophen (TYLENOL) 325 MG tablet Take 2 tablets (650 mg total) by mouth every 6 (six) hours as needed for mild pain. 07/18/16   Erin R Barrett, PA-C  aspirin EC 325 MG EC tablet Take 1 tablet (325 mg total) by mouth daily. 07/18/16   Erin R Barrett, PA-C  atorvastatin (LIPITOR) 40 MG tablet Take 1 tablet (40 mg total) by mouth daily. 07/29/16 10/27/16  Brittainy M Simmons, PA-C  esomeprazole (NEXIUM) 40 MG capsule Take 1 capsule (40 mg total) by mouth daily. 08/17/16   Donita Brooks, MD  lisinopril (PRINIVIL,ZESTRIL) 10 MG tablet Take 1 tablet (  10 mg total) by mouth daily. 08/16/16   Loreli Slot, MD  metoprolol tartrate (LOPRESSOR) 25 MG tablet Take 1 tablet (25 mg total) by mouth 2 (two) times daily. 07/18/16   Erin R Barrett, PA-C  Multiple Vitamin (MULTIVITAMIN) tablet Take 1 tablet by mouth daily.    Historical Provider, MD  traMADol (ULTRAM) 50 MG tablet TAKE 1-2 TABLETS BY MOUTH EVERY 4 HOURS AS NEEDED FOR PAIN    Historical Provider, MD    Family History Family History  Problem Relation Age of Onset  . Miscarriages / [place] Mother   . CAD Mother 54  . Heart  disease Maternal Grandfather   . Diabetes Paternal Grandfather   . Heart disease Paternal Grandfather   . Heart disease Father 54  . Diabetes Brother     Social History Social History  Substance Use Topics  . Smoking status: Former Smoker    Quit date: 08/11/2011  . Smokeless tobacco: Never Used  . Alcohol use No     Allergies   Penicillins   Review of Systems Review of Systems  Constitutional: Negative for fever.  Respiratory: Negative for shortness of breath.   Cardiovascular: Positive for chest pain.  Gastrointestinal: Negative for nausea and vomiting.  Musculoskeletal: Negative for back pain.  All other systems reviewed and are negative.    Physical Exam Updated Vital Signs BP (!) 134/97   Pulse 68   Temp 97.5 F (36.4 C) (Oral)   Resp (!) 21   Ht  (1.854 m)   Wt 172 lb (78 kg)   SpO2 95%   BMI 22.69 kg/m   Physical Exam  Constitutional: He is oriented to person, place, and time. He appears well-developed and well-nourished. No distress.  HENT:  Head: Normocephalic and atraumatic.  Right Ear: Hearing normal.  Left Ear: Hearing normal.  Nose: Nose normal.  Mouth/Throat: Oropharynx is clear and moist and mucous membranes are normal.  Eyes: Conjunctivae and EOM are normal. Pupils are equal, round, and reactive to light.  Neck: Normal range of motion. Neck supple.  Cardiovascular: Regular rhythm, S1 normal and S2 normal.  Exam reveals no gallop and no friction rub.   No murmur heard. Pulmonary/Chest: Effort normal and breath sounds normal. No respiratory distress. He exhibits no tenderness.  Abdominal: Soft. Normal appearance and bowel sounds are normal. There is no hepatosplenomegaly. There is no tenderness. There is no rebound, no guarding, no tenderness at McBurney's point and negative Murphy's sign. No hernia.  Musculoskeletal: Normal range of motion.  Neurological: He is alert and oriented to person, place, and time. He has normal strength. No  cranial nerve deficit or sensory deficit. Coordination normal. GCS eye subscore is 4. GCS verbal subscore is 5. GCS motor subscore is 6.  Skin: Skin is warm, dry and intact. No rash noted. No cyanosis.  Psychiatric: He has a normal mood and affect. His speech is normal and behavior is normal. Thought content normal.  Nursing note and vitals reviewed.    ED Treatments / Results  DIAGNOSTIC STUDIES: Oxygen Saturation is 98% on RA, normal by my interpretation.   COORDINATION OF CARE: 11:44 PM-Discussed next steps with pt. Pt verbalized understanding and is agreeable with the plan.    Labs (all labs ordered are listed, but only abnormal results are displayed) Labs Reviewed  BASIC METABOLIC PANEL - Abnormal; Notable for the following:       Result Value   Glucose, Bld 160 (*)    Calcium 8.8 (*)  All other components within normal limits  CBC - Abnormal; Notable for the following:    Hemoglobin 12.9 (*)    All other components within normal limits  I-STAT TROPOININ, ED    EKG  EKG Interpretation  Date/Time:  Sunday September 25 2016 22:51:14 EDT Ventricular Rate:  67 PR Interval:  150 QRS Duration: 78 QT Interval:  400 QTC Calculation: 422 R Axis:   70 Text Interpretation:  Normal sinus rhythm Possible Left atrial enlargement Borderline ECG Confirmed by Kasir Hallenbeck  MD, Taleah Bellantoni ([id number]) on 09/25/2016 11:37:48 PM       Radiology No results found.  Procedures Procedures (including critical care time)  Medications Ordered in ED Medications - No data to display   Initial Impression / Assessment and Plan / ED Course  I have reviewed the triage vital signs and the nursing notes.  Pertinent labs & imaging results that were available during my care of the patient were reviewed by me and considered in my medical decision making (see chart for details).  Patient is a 51 year old male with recent MI and CABG 2. This was in February of this year. He presents today with complaints of  discomfort in his chest and both arms that started while watching TV. There is no associated shortness of breath, nausea, or diaphoresis. His workup today reveals a negative troponin and unchanged EKG.   I discussed the case with Dr. Donnie Aho from cardiology. He is recommending a second troponin. If this is negative he feels as though the patient is appropriate for discharge. Upon reviewing his record, it is noted that his 2 bypasses were performed using the internal mammary arteries. This is reassuring to Dr. Donnie Aho that his grass are likely not occluded. He is recommending follow-up in the cardiology office later this week. The patient will be advised to call tomorrow to make his appointment.  Final Clinical Impressions(s) / ED Diagnoses   Final diagnoses:  None    New Prescriptions New Prescriptions   No medications on file   I personally performed the services described in this documentation, which was scribed in my presence. The recorded information has been reviewed and is accurate.       Geoffery Lyons, MD 09/26/16 [id number]

## 2016-09-26 ENCOUNTER — Encounter (HOSPITAL_COMMUNITY): Payer: BLUE CROSS/BLUE SHIELD

## 2016-09-26 LAB — I-STAT TROPONIN, ED: TROPONIN I, POC: 0 ng/mL (ref 0.00–0.08)

## 2016-09-26 NOTE — Discharge Instructions (Signed)
Call your cardiologist's office tomorrow to arrange a follow-up appointment, and return to the emergency department if your symptoms significantly worsen or change.

## 2016-09-28 ENCOUNTER — Encounter: Payer: Self-pay | Admitting: Interventional Cardiology

## 2016-09-28 ENCOUNTER — Encounter (HOSPITAL_COMMUNITY): Payer: BLUE CROSS/BLUE SHIELD

## 2016-09-28 ENCOUNTER — Ambulatory Visit (INDEPENDENT_AMBULATORY_CARE_PROVIDER_SITE_OTHER): Payer: BLUE CROSS/BLUE SHIELD | Admitting: Interventional Cardiology

## 2016-09-28 VITALS — BP 128/84 | HR 80 | Ht 73.0 in | Wt 177.2 lb

## 2016-09-28 DIAGNOSIS — E785 Hyperlipidemia, unspecified: Secondary | ICD-10-CM | POA: Insufficient documentation

## 2016-09-28 DIAGNOSIS — Z951 Presence of aortocoronary bypass graft: Secondary | ICD-10-CM

## 2016-09-28 DIAGNOSIS — I10 Essential (primary) hypertension: Secondary | ICD-10-CM | POA: Diagnosis not present

## 2016-09-28 DIAGNOSIS — I119 Hypertensive heart disease without heart failure: Secondary | ICD-10-CM | POA: Diagnosis not present

## 2016-09-28 DIAGNOSIS — I429 Cardiomyopathy, unspecified: Secondary | ICD-10-CM | POA: Insufficient documentation

## 2016-09-28 DIAGNOSIS — E782 Mixed hyperlipidemia: Secondary | ICD-10-CM | POA: Diagnosis not present

## 2016-09-28 DIAGNOSIS — Z8249 Family history of ischemic heart disease and other diseases of the circulatory system: Secondary | ICD-10-CM | POA: Diagnosis not present

## 2016-09-28 DIAGNOSIS — I25118 Atherosclerotic heart disease of native coronary artery with other forms of angina pectoris: Secondary | ICD-10-CM | POA: Diagnosis not present

## 2016-09-28 DIAGNOSIS — I5022 Chronic systolic (congestive) heart failure: Secondary | ICD-10-CM | POA: Insufficient documentation

## 2016-09-28 MED ORDER — LISINOPRIL 20 MG PO TABS
20.0000 mg | ORAL_TABLET | Freq: Every day | ORAL | 3 refills | Status: DC
Start: 2016-09-28 — End: 2017-05-29

## 2016-09-28 NOTE — Patient Instructions (Addendum)
Medication Instructions:  Your physician has recommended you make the following change in your medication:   INCREASE lisinopril to 20 mg daily.    Labwork: Your physician recommends that you return for lab work on 10/12/16 for FASTING LIPIDS, BMET, LFTs   Testing/Procedures: None ordered  Follow-Up: Your physician recommends that you schedule a follow-up appointment in: July with Dr. Eldridge Dace.    Any Other Special Instructions Will Be Listed Below (If Applicable).     If you need a refill on your cardiac medications before your next appointment, please call your pharmacy.

## 2016-09-28 NOTE — Progress Notes (Signed)
Cardiology Office Note   Date:  09/28/2016   ID:  Zachary Cuevas, DOB [birth year], MRN [id number]  PCP:  Leo Grosser, MD    No chief complaint on file. Follow-up CAD   Wt Readings from Last 3 Encounters:  09/28/16 177 lb 3.2 oz (80.4 kg)  09/25/16 172 lb (78 kg)  08/18/16 177 lb 14.6 oz (80.7 kg)       History of Present Illness: Zachary Cuevas is a 51 y.o. male  with past medical history of hypertension hyperlipidemia and strong family history of premature CAD.  In Feb 2018, he had a non-STEMI. He was found to have  severe two-vessel coronary artery disease, requiring CABG surgery. EF was 35-40% by cath estimate. His CABG was performed by Dr. Dorris Fetch on Feb 15th 2018. He underwent CABG x 2 with LIMA to LAD and RIMA to RCA. Operative TEE showed an EF of 45-50%. Postoperative course was uncomplicated. No issues with any atrial arrhythmias. He was discharged home on 07/17/2016.  He had more chest pain/arm pain on 4/29 and went to ER.  HE describes it as a feeling like he was lifting weights all day.  He felt some pain in his chest.  BP wa s180/115 at home.  He went to the ER at that point.  Troponins were negative.  THe pain had resolved.  He was sent home.  ECG was not changed.  He felt better by the time he was at the ER.  He was  alittle more tired because he had gone back to work.    Since that time, he has not had any discomfort.  BP at home has been better.  Yesterday was 140/95.  No further pain like that.  He will be on light duty for a month and then will go on to full duty.  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  He did work yesterday. He walked up and down several flights of stairs without difficulty. He states he was going up and down ladders at least 50 times without any difficulty. No discomfort and no limitations on his cardiac type activity.    Past Medical History:  Diagnosis Date  . Allergy    hay fever  .  Hypercholesteremia   . Hypertension     Past Surgical History:  Procedure Laterality Date  . CORONARY ARTERY BYPASS GRAFT N/A 07/14/2016   Procedure: CORONARY ARTERY BYPASS GRAFTING (CABG) x 2 WITH BILATERAL IMA;  Surgeon: Zachary Slot, MD;  Location: [facility] OR;  Service: Open Heart Surgery;  Laterality: N/A;  . CORONARY BALLOON ANGIOPLASTY N/A 07/13/2016   Procedure: Coronary Balloon Angioplasty;  Surgeon: Zachary Ouch, MD;  [facility];  Service: Cardiovascular;  Laterality: N/A;  . FRACTURE SURGERY  2005   boxer fracture  of hands  . LEFT HEART CATH AND CORONARY ANGIOGRAPHY N/A 07/13/2016   Procedure: Left Heart Cath and Coronary Angiography;  Surgeon: Zachary Ouch, MD;  [facility]: [facility];  Service: Cardiovascular;  Laterality: N/A;  . TEE WITHOUT CARDIOVERSION N/A 07/14/2016   Procedure: TRANSESOPHAGEAL ECHOCARDIOGRAM (TEE);  Surgeon: Zachary Slot, MD;  Location: [facility] OR;  Service: Open Heart Surgery;  Laterality: N/A;     Current Outpatient Prescriptions  Medication Sig Dispense Refill  . acetaminophen (TYLENOL) 325 MG tablet Take 2 tablets (650 mg total) by mouth every 6 (six) hours as needed for mild pain.    Marland Kitchen aspirin EC 325 MG  EC tablet Take 1 tablet (325 mg total) by mouth daily. 30 tablet 0  . atorvastatin (LIPITOR) 40 MG tablet Take 1 tablet (40 mg total) by mouth daily. 90 tablet 3  . esomeprazole (NEXIUM) 40 MG capsule Take 1 capsule (40 mg total) by mouth daily. 90 capsule 3  . metoprolol tartrate (LOPRESSOR) 25 MG tablet Take 1 tablet (25 mg total) by mouth 2 (two) times daily. 60 tablet 3  . Multiple Vitamin (MULTIVITAMIN) tablet Take 1 tablet by mouth daily.    Marland Kitchen lisinopril (PRINIVIL,ZESTRIL) 20 MG tablet Take 1 tablet (20 mg total) by mouth daily. 90 tablet 3   No current facility-administered medications for this visit.     Allergies:   Penicillins    Social History:  The patient  reports that he quit smoking about 5  years ago. He has never used smokeless tobacco. He reports that he uses drugs, including Marijuana. He reports that he does not drink alcohol.   Family History:  The patient's family history includes CAD (age of onset: 75) in his mother; Diabetes in his brother and paternal grandfather; Heart disease in his maternal grandfather and paternal grandfather; Heart disease (age of onset: 27) in his father; Miscarriages / [place] in his mother.    ROS:  Please see the history of present illness.   Otherwise, review of systems are positive for recent chest pain as noted above.   All other systems are reviewed and negative.    PHYSICAL EXAM: VS:  BP 128/84   Pulse 80   Ht  (1.854 m)   Wt 177 lb 3.2 oz (80.4 kg)   SpO2 95%   BMI 23.38 kg/m  , BMI Body mass index is 23.38 kg/m. GEN: Well nourished, well developed, in no acute distress  HEENT: normal  Neck: no JVD, carotid bruits, or masses Cardiac: RRR; no murmurs, rubs, or gallops,no edema  Respiratory:  clear to auscultation bilaterally, normal work of breathing GI: soft, nontender, nondistended, + BS MS: no deformity or atrophy  Skin: warm and dry, no rash Neuro:  Strength and sensation are intact Psych: euthymic mood, full affect   EKG:   The ekg ordered in the ER a few days ago demonstrates normal sinus rhythm, no ST segment changes   Recent Labs: 07/14/2016: ALT 29 07/15/2016: Magnesium 2.4 09/25/2016: BUN 19; Creatinine, Ser 1.00; Hemoglobin 12.9; Platelets 205; Potassium 4.1; Sodium 135   Lipid Panel    Component Value Date/Time   CHOL 133 07/14/2016 0046   TRIG 157 (H) 07/14/2016 0046   HDL 30 (L) 07/14/2016 0046   CHOLHDL 4.4 07/14/2016 0046   VLDL 31 07/14/2016 0046   LDLCALC 72 07/14/2016 0046     Other studies Reviewed: Additional studies/ records that were reviewed today with results demonstrating: ER records reviewed. CABG in February 2018 was performed with LIMA to LAD and RIMA to RCA.   ASSESSMENT AND  PLAN:  1. CAD: Chest pain with several atypical features. May of been related to high blood pressure. He was very active yesterday without any problems. We discussed treadmill testing but he is comfortable holding off for now. He will let us know if symptoms return. 2. Hypertensive heart disease: Increase lisinopril to 20 mg daily. Bmet in one week. 3. Hyperlipidemia: Continue lipid-lowering therapy. LDL well controlled in February 2018. 4. Family h/o CAD: Strong family history of premature coronary artery disease. Aggressive risk factor modification.   Current medicines are reviewed at length with the patient today.  The patient concerns regarding his medicines were addressed.  The following changes have been made:  No change  Labs/ tests ordered today include:   Orders Placed This Encounter  Procedures  . Basic Metabolic Panel (BMET)    Recommend 150 minutes/week of aerobic exercise Low fat, low carb, high fiber diet recommended  Disposition:   FU in 2 months   Signed, Lance Muss, MD  09/28/2016 1:38 PM    [facility] [address] [place], [place], [place]  [address] Phone: [phone]; Fax: [phone]

## 2016-09-30 ENCOUNTER — Encounter (HOSPITAL_COMMUNITY): Payer: BLUE CROSS/BLUE SHIELD

## 2016-10-03 ENCOUNTER — Encounter (HOSPITAL_COMMUNITY): Payer: BLUE CROSS/BLUE SHIELD

## 2016-10-05 ENCOUNTER — Encounter (HOSPITAL_COMMUNITY): Payer: BLUE CROSS/BLUE SHIELD

## 2016-10-07 ENCOUNTER — Encounter (HOSPITAL_COMMUNITY): Payer: BLUE CROSS/BLUE SHIELD

## 2016-10-10 ENCOUNTER — Encounter (HOSPITAL_COMMUNITY): Payer: BLUE CROSS/BLUE SHIELD

## 2016-10-12 ENCOUNTER — Encounter (HOSPITAL_COMMUNITY): Payer: BLUE CROSS/BLUE SHIELD

## 2016-10-12 ENCOUNTER — Other Ambulatory Visit: Payer: BLUE CROSS/BLUE SHIELD | Admitting: *Deleted

## 2016-10-12 DIAGNOSIS — I25118 Atherosclerotic heart disease of native coronary artery with other forms of angina pectoris: Secondary | ICD-10-CM

## 2016-10-12 DIAGNOSIS — I2510 Atherosclerotic heart disease of native coronary artery without angina pectoris: Secondary | ICD-10-CM | POA: Diagnosis not present

## 2016-10-12 LAB — LIPID PANEL
Chol/HDL Ratio: 4.4 ratio (ref 0.0–5.0)
Cholesterol, Total: 146 mg/dL (ref 100–199)
HDL: 33 mg/dL — ABNORMAL LOW (ref >39)
LDL Calculated: 72 mg/dL (ref 0–99)
Triglycerides: 205 mg/dL — ABNORMAL HIGH (ref 0–149)
VLDL Cholesterol Cal: 41 mg/dL — ABNORMAL HIGH (ref 5–40)

## 2016-10-12 LAB — HEPATIC FUNCTION PANEL
ALBUMIN: 4.2 g/dL (ref 3.5–5.5)
ALK PHOS: 120 IU/L — AB (ref 39–117)
ALT: 19 IU/L (ref 0–44)
AST: 21 IU/L (ref 0–40)
BILIRUBIN TOTAL: 0.2 mg/dL (ref 0.0–1.2)
BILIRUBIN, DIRECT: 0.09 mg/dL (ref 0.00–0.40)
TOTAL PROTEIN: 7 g/dL (ref 6.0–8.5)

## 2016-10-12 LAB — BASIC METABOLIC PANEL
BUN/Creatinine Ratio: 17 (ref 9–20)
BUN: 16 mg/dL (ref 6–24)
CO2: 22 mmol/L (ref 18–29)
CREATININE: 0.92 mg/dL (ref 0.76–1.27)
Calcium: 9.6 mg/dL (ref 8.7–10.2)
Chloride: 103 mmol/L (ref 96–106)
GFR calc Af Amer: 113 mL/min/{1.73_m2} (ref >59)
GFR, EST NON AFRICAN AMERICAN: 97 mL/min/{1.73_m2} (ref >59)
Glucose: 85 mg/dL (ref 65–99)
Potassium: 4.7 mmol/L (ref 3.5–5.2)
Sodium: 142 mmol/L (ref 134–144)

## 2016-10-14 ENCOUNTER — Encounter (HOSPITAL_COMMUNITY): Payer: BLUE CROSS/BLUE SHIELD

## 2016-10-17 ENCOUNTER — Encounter (HOSPITAL_COMMUNITY): Payer: BLUE CROSS/BLUE SHIELD

## 2016-10-18 ENCOUNTER — Other Ambulatory Visit: Payer: Self-pay

## 2016-10-18 DIAGNOSIS — E782 Mixed hyperlipidemia: Secondary | ICD-10-CM

## 2016-10-19 ENCOUNTER — Encounter (HOSPITAL_COMMUNITY): Payer: BLUE CROSS/BLUE SHIELD

## 2016-10-21 ENCOUNTER — Encounter (HOSPITAL_COMMUNITY): Payer: BLUE CROSS/BLUE SHIELD

## 2016-10-25 ENCOUNTER — Telehealth: Payer: Self-pay | Admitting: *Deleted

## 2016-10-25 DIAGNOSIS — E781 Pure hyperglyceridemia: Secondary | ICD-10-CM

## 2016-10-25 NOTE — Telephone Encounter (Signed)
-----   Message from Leone BrandLaura R Ingold, NP sent at 10/21/2016  4:47 PM EDT ----- LDL is good but triglycerides elevated.  Decrease sweets and alcohol if he uses any.  We can add another medication or he can adjust diet and recheck in 2 months.

## 2016-10-26 ENCOUNTER — Encounter (HOSPITAL_COMMUNITY): Payer: BLUE CROSS/BLUE SHIELD

## 2016-10-28 ENCOUNTER — Encounter (HOSPITAL_COMMUNITY): Payer: BLUE CROSS/BLUE SHIELD

## 2016-10-31 ENCOUNTER — Other Ambulatory Visit: Payer: BLUE CROSS/BLUE SHIELD

## 2016-10-31 ENCOUNTER — Encounter (HOSPITAL_COMMUNITY): Payer: BLUE CROSS/BLUE SHIELD

## 2016-11-02 ENCOUNTER — Encounter (HOSPITAL_COMMUNITY): Payer: BLUE CROSS/BLUE SHIELD

## 2016-11-03 ENCOUNTER — Ambulatory Visit: Payer: BLUE CROSS/BLUE SHIELD | Admitting: Interventional Cardiology

## 2016-11-04 ENCOUNTER — Encounter (HOSPITAL_COMMUNITY): Payer: BLUE CROSS/BLUE SHIELD

## 2016-11-07 ENCOUNTER — Encounter (HOSPITAL_COMMUNITY): Payer: BLUE CROSS/BLUE SHIELD

## 2016-11-08 ENCOUNTER — Other Ambulatory Visit: Payer: Self-pay | Admitting: Physician Assistant

## 2016-11-09 ENCOUNTER — Encounter (HOSPITAL_COMMUNITY): Payer: BLUE CROSS/BLUE SHIELD

## 2016-11-11 ENCOUNTER — Encounter (HOSPITAL_COMMUNITY): Payer: BLUE CROSS/BLUE SHIELD

## 2016-11-14 ENCOUNTER — Encounter (HOSPITAL_COMMUNITY): Payer: BLUE CROSS/BLUE SHIELD

## 2016-11-14 ENCOUNTER — Other Ambulatory Visit: Payer: Self-pay | Admitting: Physician Assistant

## 2016-11-16 ENCOUNTER — Encounter (HOSPITAL_COMMUNITY): Payer: BLUE CROSS/BLUE SHIELD

## 2016-11-18 ENCOUNTER — Encounter (HOSPITAL_COMMUNITY): Payer: BLUE CROSS/BLUE SHIELD

## 2016-11-19 ENCOUNTER — Other Ambulatory Visit: Payer: Self-pay | Admitting: Physician Assistant

## 2016-11-21 ENCOUNTER — Other Ambulatory Visit: Payer: Self-pay | Admitting: *Deleted

## 2016-11-21 ENCOUNTER — Telehealth: Payer: Self-pay | Admitting: Interventional Cardiology

## 2016-11-21 ENCOUNTER — Encounter (HOSPITAL_COMMUNITY): Payer: BLUE CROSS/BLUE SHIELD

## 2016-11-21 MED ORDER — METOPROLOL TARTRATE 25 MG PO TABS: 25.0000 mg | ORAL_TABLET | Freq: Two times a day (BID) | ORAL | 1 refills | Status: DC

## 2016-11-21 NOTE — Telephone Encounter (Signed)
NEW MESSAGE   *STAT* If patient is at the pharmacy, call can be transferred to refill team.   1. Which medications need to be refilled? (please list name of each medication and dose if known)  METOPROLOL 25MG   2. Which pharmacy/location (including street and city if local pharmacy) is medication to be sent to? [facility] [address]  3. Do they need a 30 day or 90 day supply?  90DAY  NEEDS DONE PREFERABLY TODAY! LAST PILL IS TODAY

## 2016-11-23 ENCOUNTER — Encounter (HOSPITAL_COMMUNITY): Payer: BLUE CROSS/BLUE SHIELD

## 2016-11-24 ENCOUNTER — Telehealth: Payer: Self-pay | Admitting: Interventional Cardiology

## 2016-11-24 ENCOUNTER — Other Ambulatory Visit: Payer: Self-pay | Admitting: *Deleted

## 2016-11-24 MED ORDER — METOPROLOL TARTRATE 25 MG PO TABS: 25.0000 mg | ORAL_TABLET | Freq: Two times a day (BID) | ORAL | 2 refills | Status: DC

## 2016-11-24 NOTE — Telephone Encounter (Signed)
New Message   *STAT* If patient is at the pharmacy, call can be transferred to refill team.   1. Which medications need to be refilled? (please list name of each medication and dose if known) Metoprolol 25mg    2. Which pharmacy/location (including street and city if local pharmacy) is medication to be sent to? [facility] [address]   3. Do they need a 30 day or 90 day supply? 90  Per pt out of medication

## 2016-11-24 NOTE — Telephone Encounter (Signed)
This was ordered yesterday, but was on print mode. I have corrected this and have e-scribe confirmation that it was received by requested pharmacy.

## 2016-11-25 ENCOUNTER — Encounter (HOSPITAL_COMMUNITY): Payer: BLUE CROSS/BLUE SHIELD

## 2016-11-27 NOTE — Progress Notes (Signed)
Cardiology Office Note   Date:  11/28/2016   ID:  Zachary Cuevas, DOB 03/03/1966, MRN [id number]  PCP:  Donita Brooks, MD    No chief complaint on file.  CAD  Wt Readings from Last 3 Encounters:  11/28/16 182 lb 6.4 oz (82.7 kg)  09/28/16 177 lb 3.2 oz (80.4 kg)  09/25/16 172 lb (78 kg)       History of Present Illness: Zachary Cuevas is a 51 y.o. male  with past medical history of hypertension hyperlipidemia and strong family history of premature CAD.  In Feb 2018, he had a non-STEMI. He was found to have severe two-vessel coronary artery disease, requiringCABG surgery. EF was 35-40% by cath estimate. His CABG was performed by Dr. Darci Needle Feb15th 2018. He underwent CABG x 2 with LIMA to LAD and RIMA to RCA. Operative TEE showed an EF of 45-50%. Postoperative course was uncomplicated. No issues with any atrial arrhythmias. He was discharged home on 07/17/2016.  He had more chest pain/arm pain on 4/29 and went to ER.  HE describes it as a feeling like he was lifting weights all day.  He felt some pain in his chest.  BP wa s180/115 at home.  He went to the ER at that point.  Troponins were negative.  THe pain had resolved.  He was sent home.  ECG was not changed.  He felt better by the time he was at the ER.    Since that time, no anginal type chest pain.  He has some occasional soreness in his chest.  He ran out of metoprolol and felt a little strange.  This has resolved since restarting the medicine.    Denies : Anginal Chest pain. Dizziness. Leg edema. Nitroglycerin use. Orthopnea. Palpitations. Paroxysmal nocturnal dyspnea. Shortness of breath. Syncope.   No problems keeping up with his physical job.         Past Medical History:  Diagnosis Date  . Allergy    hay fever  . Hypercholesteremia   . Hypertension     Past Surgical History:  Procedure Laterality Date  . CORONARY ARTERY BYPASS GRAFT N/A 07/14/2016   Procedure: CORONARY ARTERY BYPASS GRAFTING  (CABG) x 2 WITH BILATERAL IMA;  Surgeon: Loreli Slot, MD;  Location: [facility] OR;  Service: Open Heart Surgery;  Laterality: N/A;  . CORONARY BALLOON ANGIOPLASTY N/A 07/13/2016   Procedure: Coronary Balloon Angioplasty;  Surgeon: Iran Ouch, MD;  Location: [facility] LAB;  Service: Cardiovascular;  Laterality: N/A;  . FRACTURE SURGERY  2005   boxer fracture  of hands  . LEFT HEART CATH AND CORONARY ANGIOGRAPHY N/A 07/13/2016   Procedure: Left Heart Cath and Coronary Angiography;  Surgeon: Iran Ouch, MD;  Location: [facility];  Service: Cardiovascular;  Laterality: N/A;  . TEE WITHOUT CARDIOVERSION N/A 07/14/2016   Procedure: TRANSESOPHAGEAL ECHOCARDIOGRAM (TEE);  Surgeon: Loreli Slot, MD;  Location: [facility] OR;  Service: Open Heart Surgery;  Laterality: N/A;     Current Outpatient Prescriptions  Medication Sig Dispense Refill  . acetaminophen (TYLENOL) 325 MG tablet Take 2 tablets (650 mg total) by mouth every 6 (six) hours as needed for mild pain.    Marland Kitchen aspirin EC 325 MG EC tablet Take 1 tablet (325 mg total) by mouth daily. 30 tablet 0  . atorvastatin (LIPITOR) 40 MG tablet Take 1 tablet (40 mg total) by mouth daily. 90 tablet 3  . esomeprazole (NEXIUM) 40 MG capsule Take 1 capsule (  40 mg total) by mouth daily. 90 capsule 3  . lisinopril (PRINIVIL,ZESTRIL) 20 MG tablet Take 1 tablet (20 mg total) by mouth daily. 90 tablet 3  . metoprolol tartrate (LOPRESSOR) 25 MG tablet Take 1 tablet (25 mg total) by mouth 2 (two) times daily. 180 tablet 2  . Multiple Vitamin (MULTIVITAMIN) tablet Take 1 tablet by mouth daily.     No current facility-administered medications for this visit.     Allergies:   Penicillins    Social History:  The patient  reports that he quit smoking about 5 years ago. He has never used smokeless tobacco. He reports that he uses drugs, including Marijuana. He reports that he does not drink alcohol.   Family History:  The patient family history  includes CAD (age of onset: 3432) in his mother; Diabetes in his brother and paternal grandfather; Heart disease in his maternal grandfather and paternal grandfather; Heart disease (age of onset: 4848) in his father; Miscarriages / [place] in his mother.    ROS:  Please see the history of present illness.   Otherwise, review of systems are positive for recent rare headaches- once a week.   All other systems are reviewed and negative.    PHYSICAL EXAM: VS:  BP 130/84   Pulse 73   Ht 6\' 1"  (1.854 m)   Wt 182 lb 6.4 oz (82.7 kg)   SpO2 97%   BMI 24.06 kg/m  , BMI Body mass index is 24.06 kg/m. GEN: Well nourished, well developed, in no acute distress  HEENT: normal  Neck: no JVD, carotid bruits, or masses Cardiac: RRR; no murmurs, rubs, or gallops,no edema  Respiratory:  clear to auscultation bilaterally, normal work of breathing GI: soft, nontender, nondistended, + BS MS: no deformity or atrophy  Skin: warm and dry, no rash; tattoos Neuro:  Strength and sensation are intact Psych: euthymic mood, full affect    Recent Labs: 07/15/2016: Magnesium 2.4 09/25/2016: Hemoglobin 12.9; Platelets 205 10/12/2016: ALT 19; BUN 16; Creatinine, Ser 0.92; Potassium 4.7; Sodium 142   Lipid Panel    Component Value Date/Time   CHOL 146 10/12/2016 1029   TRIG 205 (H) 10/12/2016 1029   HDL 33 (L) 10/12/2016 1029   CHOLHDL 4.4 10/12/2016 1029   CHOLHDL 4.4 07/14/2016 0046   VLDL 31 07/14/2016 0046   LDLCALC 72 10/12/2016 1029     Other studies Reviewed: Additional studies/ records that were reviewed today with results demonstrating: LDL well controlled in May 2018.   ASSESSMENT AND PLAN:  1. CAD: No anginal chest pain. Continue aggressive secondary prevention. Decrease aspirin 81 mg daily.  No further symptoms. Would not plan any ischemic testing at this time. 2. Hypertensive heart disease: Blood pressure well controlled on increased lisinopril. 3. Family h/o CAD: We talked about healthy  habits, maintaining exercise which he does at work. 4. Hyperlipidemia: Lipids well controlled last month. Continue lipid-lowering therapy. 5. OK to use Tylenol prn for headaches.  THey are getting less frequent.  I would prefer tylenol to Goody powders.    Current medicines are reviewed at length with the patient today.  The patient concerns regarding his medicines were addressed.  The following changes have been made:  No change  Labs/ tests ordered today include:  No orders of the defined types were placed in this encounter.   Recommend 150 minutes/week of aerobic exercise Low fat, low carb, high fiber diet recommended  Disposition:   FU in 6 months   Signed, Lance MussJayadeep Varanasi,  MD  11/28/2016 8:38 AM    [facility] [address] [place], [place], [place]  [address] Phone: [phone]; Fax: [phone]

## 2016-11-28 ENCOUNTER — Encounter (HOSPITAL_COMMUNITY): Payer: BLUE CROSS/BLUE SHIELD

## 2016-11-28 ENCOUNTER — Ambulatory Visit (INDEPENDENT_AMBULATORY_CARE_PROVIDER_SITE_OTHER): Payer: BLUE CROSS/BLUE SHIELD | Admitting: Interventional Cardiology

## 2016-11-28 ENCOUNTER — Encounter: Payer: Self-pay | Admitting: Interventional Cardiology

## 2016-11-28 VITALS — BP 130/84 | HR 73 | Ht 73.0 in | Wt 182.4 lb

## 2016-11-28 DIAGNOSIS — R51 Headache: Secondary | ICD-10-CM | POA: Diagnosis not present

## 2016-11-28 DIAGNOSIS — I25119 Atherosclerotic heart disease of native coronary artery with unspecified angina pectoris: Secondary | ICD-10-CM | POA: Diagnosis not present

## 2016-11-28 DIAGNOSIS — R519 Headache, unspecified: Secondary | ICD-10-CM

## 2016-11-28 DIAGNOSIS — I119 Hypertensive heart disease without heart failure: Secondary | ICD-10-CM

## 2016-11-28 DIAGNOSIS — E782 Mixed hyperlipidemia: Secondary | ICD-10-CM

## 2016-11-28 DIAGNOSIS — Z8249 Family history of ischemic heart disease and other diseases of the circulatory system: Secondary | ICD-10-CM

## 2016-11-28 MED ORDER — ASPIRIN 81 MG PO TBEC: 81.0000 mg | DELAYED_RELEASE_TABLET | Freq: Every day | ORAL | Status: DC

## 2016-11-28 NOTE — Patient Instructions (Addendum)
Medication Instructions:  1) DECREASE ASPIRIN to 81 mg daily  Labwork: None  Testing/Procedures: None  Follow-Up: Your physician wants you to follow-up in: 6 months with Dr. Eldridge DaceVaranasi. You will receive a reminder letter in the mail two months in advance. If you don't receive a letter, please call our office to schedule the follow-up appointment.   Any Other Special Instructions Will Be Listed Below (If Applicable).     If you need a refill on your cardiac medications before your next appointment, please call your pharmacy.

## 2016-11-30 ENCOUNTER — Encounter (HOSPITAL_COMMUNITY): Payer: BLUE CROSS/BLUE SHIELD

## 2016-12-02 ENCOUNTER — Encounter (HOSPITAL_COMMUNITY): Payer: BLUE CROSS/BLUE SHIELD

## 2016-12-05 ENCOUNTER — Encounter (HOSPITAL_COMMUNITY): Payer: BLUE CROSS/BLUE SHIELD

## 2016-12-07 ENCOUNTER — Encounter (HOSPITAL_COMMUNITY): Payer: BLUE CROSS/BLUE SHIELD

## 2016-12-09 ENCOUNTER — Encounter (HOSPITAL_COMMUNITY): Payer: BLUE CROSS/BLUE SHIELD

## 2016-12-09 ENCOUNTER — Other Ambulatory Visit: Payer: Self-pay | Admitting: Thoracic Surgery (Cardiothoracic Vascular Surgery)

## 2016-12-12 ENCOUNTER — Encounter (HOSPITAL_COMMUNITY): Payer: BLUE CROSS/BLUE SHIELD

## 2016-12-14 ENCOUNTER — Encounter (HOSPITAL_COMMUNITY): Payer: BLUE CROSS/BLUE SHIELD

## 2016-12-16 ENCOUNTER — Encounter (HOSPITAL_COMMUNITY): Payer: BLUE CROSS/BLUE SHIELD

## 2016-12-19 ENCOUNTER — Encounter (HOSPITAL_COMMUNITY): Payer: BLUE CROSS/BLUE SHIELD

## 2017-01-18 ENCOUNTER — Other Ambulatory Visit: Payer: BLUE CROSS/BLUE SHIELD | Admitting: *Deleted

## 2017-01-18 DIAGNOSIS — E781 Pure hyperglyceridemia: Secondary | ICD-10-CM

## 2017-01-18 DIAGNOSIS — E782 Mixed hyperlipidemia: Secondary | ICD-10-CM | POA: Diagnosis not present

## 2017-01-18 LAB — HEPATIC FUNCTION PANEL
ALT: 18 IU/L (ref 0–44)
AST: 20 IU/L (ref 0–40)
Albumin: 4.2 g/dL (ref 3.5–5.5)
Alkaline Phosphatase: 98 IU/L (ref 39–117)
Bilirubin Total: 0.3 mg/dL (ref 0.0–1.2)
Bilirubin, Direct: 0.1 mg/dL (ref 0.00–0.40)
Total Protein: 6.4 g/dL (ref 6.0–8.5)

## 2017-01-18 LAB — LIPID PANEL
CHOLESTEROL TOTAL: 113 mg/dL (ref 100–199)
Chol/HDL Ratio: 4 ratio (ref 0.0–5.0)
HDL: 28 mg/dL — AB (ref >39)
LDL Calculated: 42 mg/dL (ref 0–99)
TRIGLYCERIDES: 215 mg/dL — AB (ref 0–149)
VLDL Cholesterol Cal: 43 mg/dL — ABNORMAL HIGH (ref 5–40)

## 2017-03-04 DIAGNOSIS — H40033 Anatomical narrow angle, bilateral: Secondary | ICD-10-CM | POA: Diagnosis not present

## 2017-03-04 DIAGNOSIS — H1013 Acute atopic conjunctivitis, bilateral: Secondary | ICD-10-CM | POA: Diagnosis not present

## 2017-05-29 ENCOUNTER — Encounter: Payer: Self-pay | Admitting: Interventional Cardiology

## 2017-05-29 ENCOUNTER — Ambulatory Visit: Payer: BLUE CROSS/BLUE SHIELD | Admitting: Interventional Cardiology

## 2017-05-29 VITALS — BP 120/82 | HR 75 | Ht 73.0 in | Wt 178.0 lb

## 2017-05-29 DIAGNOSIS — I2510 Atherosclerotic heart disease of native coronary artery without angina pectoris: Secondary | ICD-10-CM | POA: Diagnosis not present

## 2017-05-29 DIAGNOSIS — Z951 Presence of aortocoronary bypass graft: Secondary | ICD-10-CM | POA: Diagnosis not present

## 2017-05-29 DIAGNOSIS — E782 Mixed hyperlipidemia: Secondary | ICD-10-CM | POA: Diagnosis not present

## 2017-05-29 DIAGNOSIS — I119 Hypertensive heart disease without heart failure: Secondary | ICD-10-CM | POA: Diagnosis not present

## 2017-05-29 MED ORDER — LOSARTAN POTASSIUM 50 MG PO TABS: 50.0000 mg | ORAL_TABLET | Freq: Every day | ORAL | 3 refills | Status: DC

## 2017-05-29 NOTE — Patient Instructions (Signed)
Medication Instructions:  Your physician has recommended you make the following change in your medication:   STOP: lisinopril  START: losartan 50 mg daily  Labwork: Your physician recommends that you return for lab work in: 1-2 weeks for BMET   Testing/Procedures: None ordered  Follow-Up: Your physician wants you to follow-up in: 1 year with Dr. Eldridge DaceVaranasi. You will receive a reminder letter in the mail two months in advance. If you don't receive a letter, please call our office to schedule the follow-up appointment.   Any Other Special Instructions Will Be Listed Below (If Applicable).     If you need a refill on your cardiac medications before your next appointment, please call your pharmacy.

## 2017-05-29 NOTE — Progress Notes (Signed)
Cardiology Office Note   Date:  05/29/2017   ID:  Zachary PinaDaniel Cuevas, DOB 04/09/1966, MRN [id number]  PCP:  Donita BrooksPickard, Warren T, MD    No chief complaint on file.  CAD  Wt Readings from Last 3 Encounters:  05/29/17 178 lb (80.7 kg)  11/28/16 182 lb 6.4 oz (82.7 kg)  09/28/16 177 lb 3.2 oz (80.4 kg)       History of Present Illness: Zachary PinaDaniel Cuevas is a 51 y.o. male  with past medical history of hypertension hyperlipidemia and strong family history of premature CAD. In Feb 2018, he had anon-STEMI. He was found to have severe two-vessel coronary artery disease, requiringCABG surgery. EF was 35-40% by cath estimate. His CABG was performed by Dr. Darci NeedleHendricksonon Feb15th 2018. He underwent CABG x 2 with LIMA to LAD and RIMA to RCA. Operative TEE showed an EF of 45-50%. Postoperative course was uncomplicated. No issues with any atrial arrhythmias. He was discharged home on 07/17/2016.  He had more chest pain/arm pain on 4/29 and went to ER. HE describes it as a feeling like he was lifting weights all day. He felt some pain in his chest. BP wa s180/115 at home. He went to the ER at that point. Troponins were negative. THe pain had resolved. He was sent home. ECG was not changed. He felt better by the time he was at the ER.   Denies : Chest pain. Dizziness. Leg edema. Nitroglycerin use. Orthopnea. Palpitations. Paroxysmal nocturnal dyspnea. Shortness of breath. Syncope.   He gets a lot of activity with his job.  He has to lift a lot, up to 50 lbs.  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    He thinks he could improve his diet.  He has decreased fast food intake, but not completely.    He reports a dry cough.    Past Medical History:  Diagnosis Date  . Allergy    hay fever  . Hypercholesteremia   . Hypertension     Past Surgical History:  Procedure Laterality Date  . CORONARY ARTERY BYPASS GRAFT N/A 07/14/2016   Procedure: CORONARY ARTERY BYPASS GRAFTING (CABG) x 2 WITH  BILATERAL IMA;  Surgeon: Loreli SlotSteven C Hendrickson, MD;  [facility];  Service: Open Heart Surgery;  Laterality: N/A;  . CORONARY BALLOON ANGIOPLASTY N/A 07/13/2016   Procedure: Coronary Balloon Angioplasty;  Surgeon: Iran OuchMuhammad A Arida, MD;  Location: [facility] LAB;  Service: Cardiovascular;  Laterality: N/A;  . FRACTURE SURGERY  2005   boxer fracture  of hands  . LEFT HEART CATH AND CORONARY ANGIOGRAPHY N/A 07/13/2016   Procedure: Left Heart Cath and Coronary Angiography;  Surgeon: Iran OuchMuhammad A Arida, MD;  Location: [facility] LAB;  Service: Cardiovascular;  Laterality: N/A;  . TEE WITHOUT CARDIOVERSION N/A 07/14/2016   Procedure: TRANSESOPHAGEAL ECHOCARDIOGRAM (TEE);  Surgeon: Loreli SlotSteven C Hendrickson, MD;  [facility];  Service: Open Heart Surgery;  Laterality: N/A;     Current Outpatient Medications  Medication Sig Dispense Refill  . acetaminophen (TYLENOL) 325 MG tablet Take 2 tablets (650 mg total) by mouth every 6 (six) hours as needed for mild pain.    Marland Kitchen. aspirin 81 MG EC tablet Take 1 tablet (81 mg total) by mouth daily.    Marland Kitchen. esomeprazole (NEXIUM) 40 MG capsule Take 1 capsule (40 mg total) by mouth daily. 90 capsule 3  . metoprolol tartrate (LOPRESSOR) 25 MG tablet Take 1 tablet (25 mg total) by mouth 2 (two) times daily. 180 tablet 2  .  atorvastatin (LIPITOR) 40 MG tablet Take 1 tablet (40 mg total) by mouth daily. 90 tablet 3  . lisinopril (PRINIVIL,ZESTRIL) 20 MG tablet Take 1 tablet (20 mg total) by mouth daily. 90 tablet 3   No current facility-administered medications for this visit.     Allergies:   Penicillins    Social History:  The patient  reports that he quit smoking about 5 years ago. he has never used smokeless tobacco. He reports that he uses drugs. Drug: Marijuana. He reports that he does not drink alcohol.   Family History:  The patient's family history includes CAD (age of onset: 3132) in his mother; Diabetes in his brother and paternal grandfather; Heart  disease in his maternal grandfather and paternal grandfather; Heart disease (age of onset: 3948) in his father; Miscarriages / [place] in his mother.    ROS:  Please see the history of present illness.   Otherwise, review of systems are positive for improved diet.   All other systems are reviewed and negative.    PHYSICAL EXAM: VS:  BP 120/82   Pulse 75   Ht 6\' 1"  (1.854 m)   Wt 178 lb (80.7 kg)   SpO2 95%   BMI 23.48 kg/m  , BMI Body mass index is 23.48 kg/m. GEN: Well nourished, well developed, in no acute distress  HEENT: normal  Neck: no JVD, carotid bruits, or masses Cardiac: RRR; no murmurs, rubs, or gallops,no edema  Respiratory:  clear to auscultation bilaterally, normal work of breathing GI: soft, nontender, nondistended, + BS MS: no deformity or atrophy  Skin: warm and dry, no rash Neuro:  Strength and sensation are intact Psych: euthymic mood, full affect    Recent Labs: 07/15/2016: Magnesium 2.4 09/25/2016: Hemoglobin 12.9; Platelets 205 10/12/2016: BUN 16; Creatinine, Ser 0.92; Potassium 4.7; Sodium 142 01/18/2017: ALT 18   Lipid Panel    Component Value Date/Time   CHOL 113 01/18/2017 0732   TRIG 215 (H) 01/18/2017 0732   HDL 28 (L) 01/18/2017 0732   CHOLHDL 4.0 01/18/2017 0732   CHOLHDL 4.4 07/14/2016 0046   VLDL 31 07/14/2016 0046   LDLCALC 42 01/18/2017 0732     Other studies Reviewed: Additional studies/ records that were reviewed today with results demonstrating: lipids well controlled.   ASSESSMENT AND PLAN:  1. CAD: No angina.  Continue aggressive secondary prevention.  2. Hypertensive heart disease: Due to cough, switch to losartan 50 mg daily. Stop lisinopril.  BP well controlled. Check electrolytes after 1-2 weeks post change.  3. Hyperlipidemia:  LDL 42 in 8/18.  COntinue atorvastatin.  4. Headaches: much less frequent.  5. Family h/o CAD: Spoke about importance of dietary changes and prevention.    Current medicines are reviewed at  length with the patient today.  The patient concerns regarding his medicines were addressed.  The following changes have been made:  No change  Labs/ tests ordered today include:  No orders of the defined types were placed in this encounter.   Recommend 150 minutes/week of aerobic exercise Low fat, low carb, high fiber diet recommended  Disposition:   FU in 1 year   Signed, Lance MussJayadeep Danitra Payano, MD  05/29/2017 4:20 PM    [facility] [address], [place], [place]  [address] Phone: [phone]; Fax: [phone]

## 2017-06-05 ENCOUNTER — Other Ambulatory Visit: Payer: BLUE CROSS/BLUE SHIELD | Admitting: *Deleted

## 2017-06-05 DIAGNOSIS — I2510 Atherosclerotic heart disease of native coronary artery without angina pectoris: Secondary | ICD-10-CM | POA: Diagnosis not present

## 2017-06-05 DIAGNOSIS — I119 Hypertensive heart disease without heart failure: Secondary | ICD-10-CM | POA: Diagnosis not present

## 2017-06-06 LAB — BASIC METABOLIC PANEL
BUN / CREAT RATIO: 13 (ref 9–20)
BUN: 13 mg/dL (ref 6–24)
CHLORIDE: 105 mmol/L (ref 96–106)
CO2: 21 mmol/L (ref 20–29)
CREATININE: 1.03 mg/dL (ref 0.76–1.27)
Calcium: 9.6 mg/dL (ref 8.7–10.2)
GFR calc non Af Amer: 84 mL/min/{1.73_m2} (ref >59)
GFR, EST AFRICAN AMERICAN: 97 mL/min/{1.73_m2} (ref >59)
Glucose: 78 mg/dL (ref 65–99)
Potassium: 4.3 mmol/L (ref 3.5–5.2)
Sodium: 144 mmol/L (ref 134–144)

## 2017-06-20 IMAGING — CR DG CHEST 1V PORT
1 series · 1 of 1 positions shown · non-contrast
Comparison: 07/14/2016

CLINICAL DATA: Status post coronary bypass grafting

EXAM:
PORTABLE CHEST 1 VIEW

[AP]
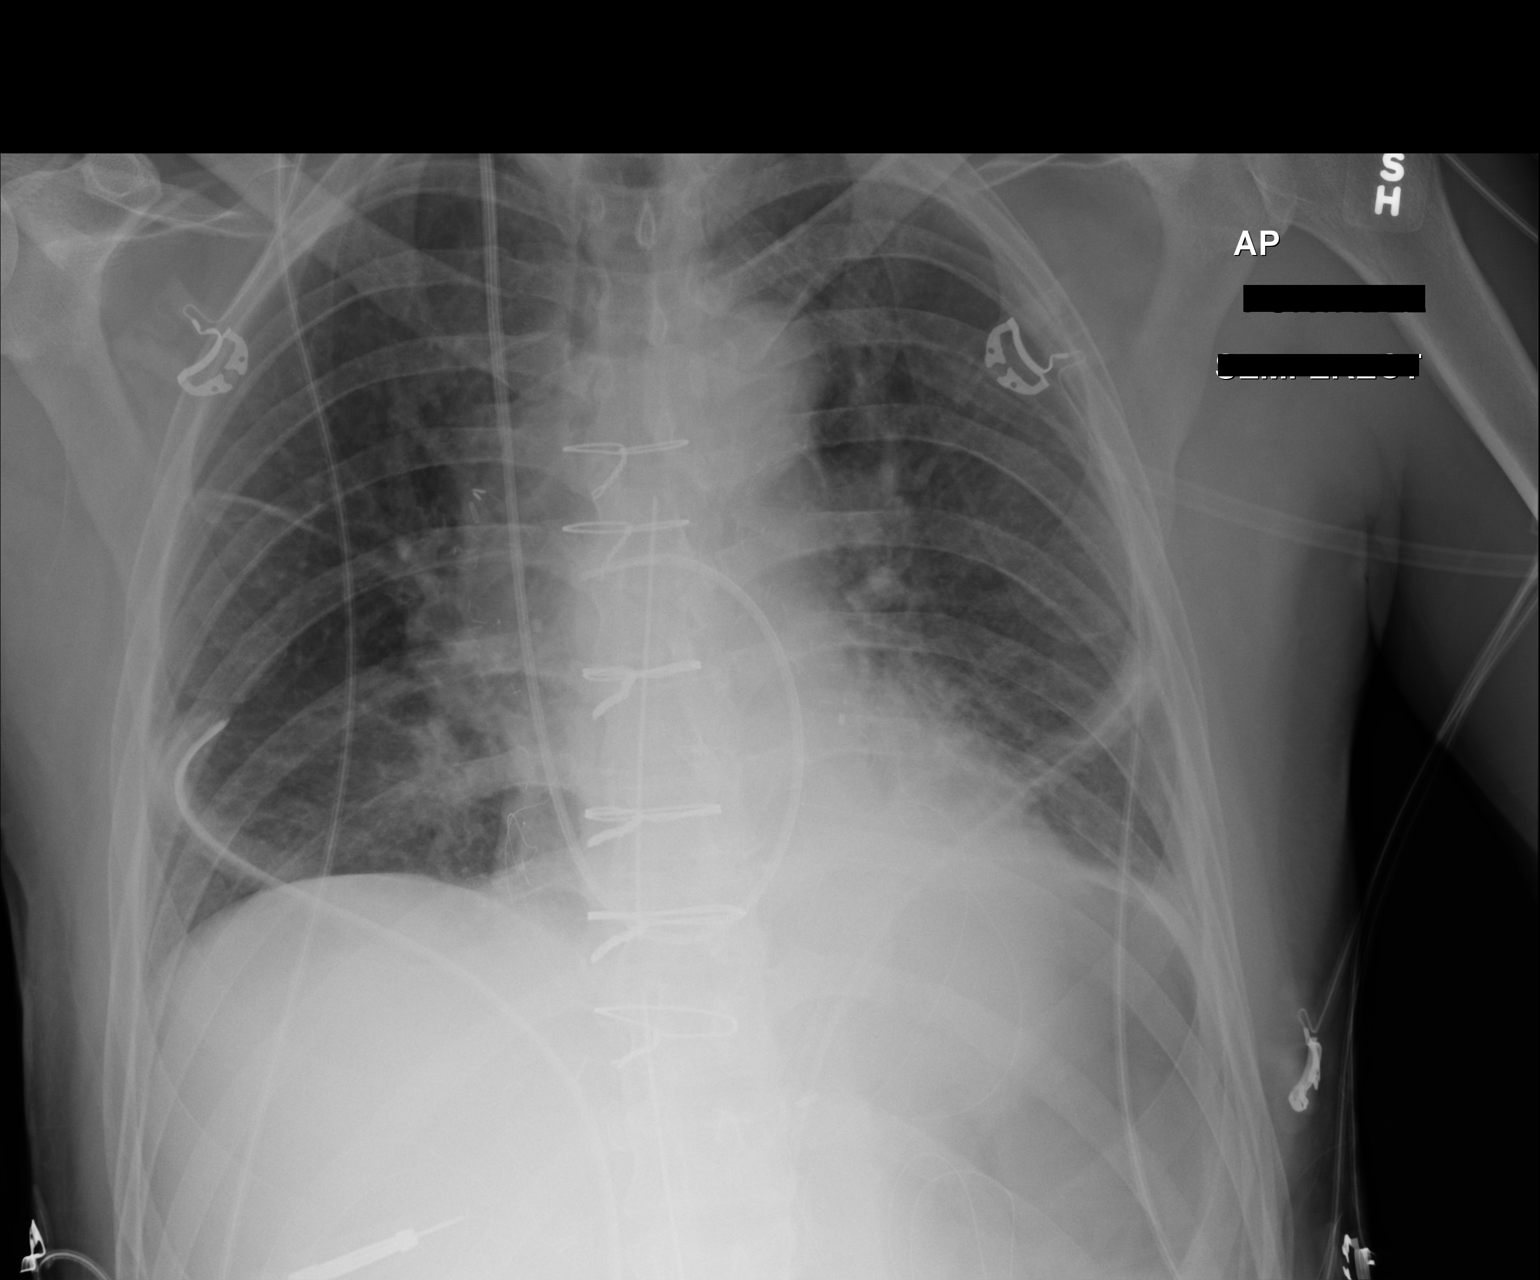

[1 of 1 positions shown; findings below may reference images not displayed]

FINDINGS: Cardiac shadow is stable. Postoperative changes are seen. Swan-Ganz
catheter, mediastinal drain and bilateral thoracostomy catheters are
again noted. The endotracheal tube and nasogastric catheter have
been removed in the interval. Minimal thickening of the minor
fissure remains. Mild left basilar atelectasis is seen. No
pneumothorax is noted. No sizable effusion or infiltrate are noted.
IMPRESSION: Mild left basilar atelectasis.

Tubes and lines as described.

No pneumothorax.

## 2017-07-22 IMAGING — CR DG CHEST 2V
2 series · 2 of 2 positions shown · non-contrast
Comparison: Chest x-ray 07/16/2016.

CLINICAL DATA: 50-year-old male with history of CABG on 07/14/2016,
complaining of chest tenderness.

EXAM:
CHEST  2 VIEW

[w chest pa]
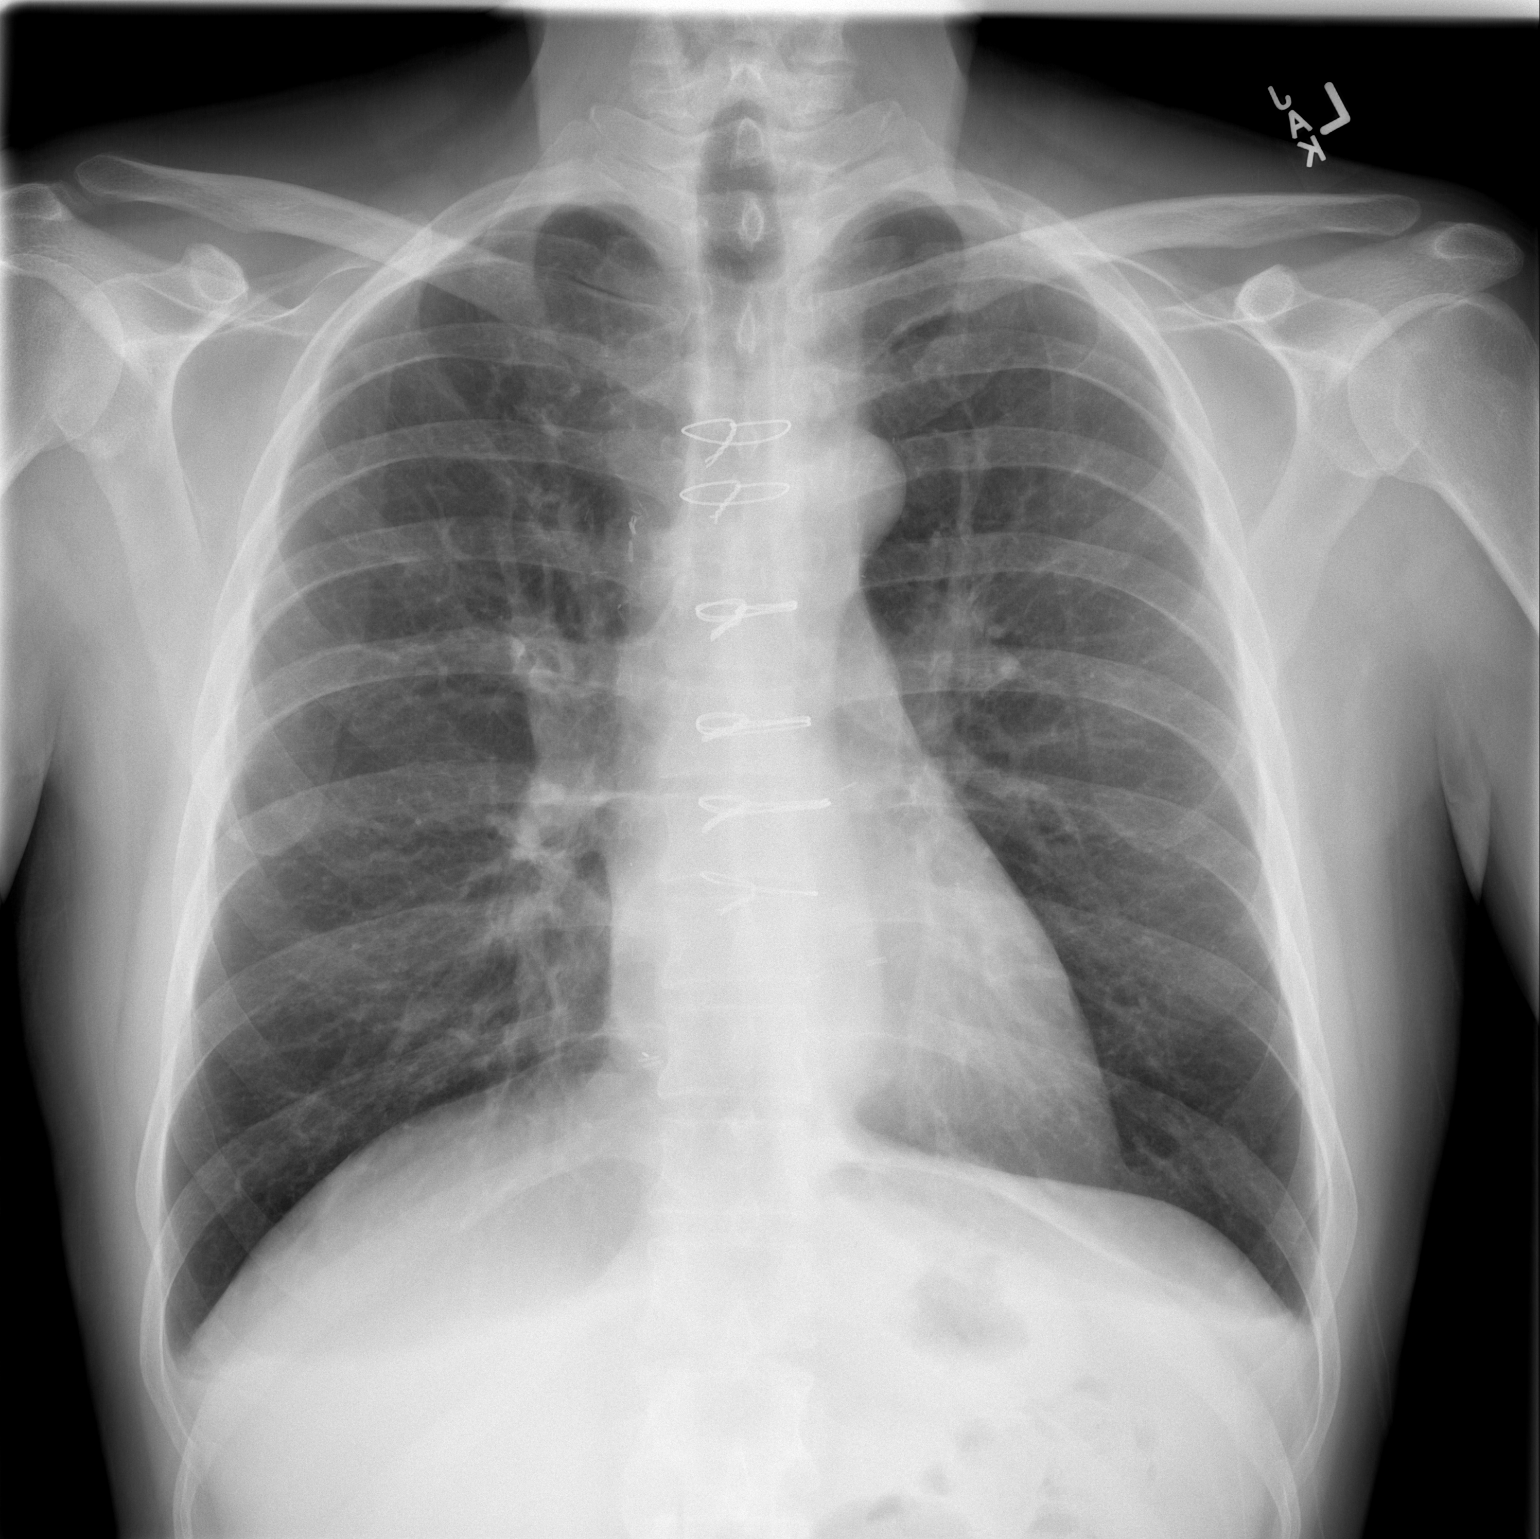

[w chest lat]
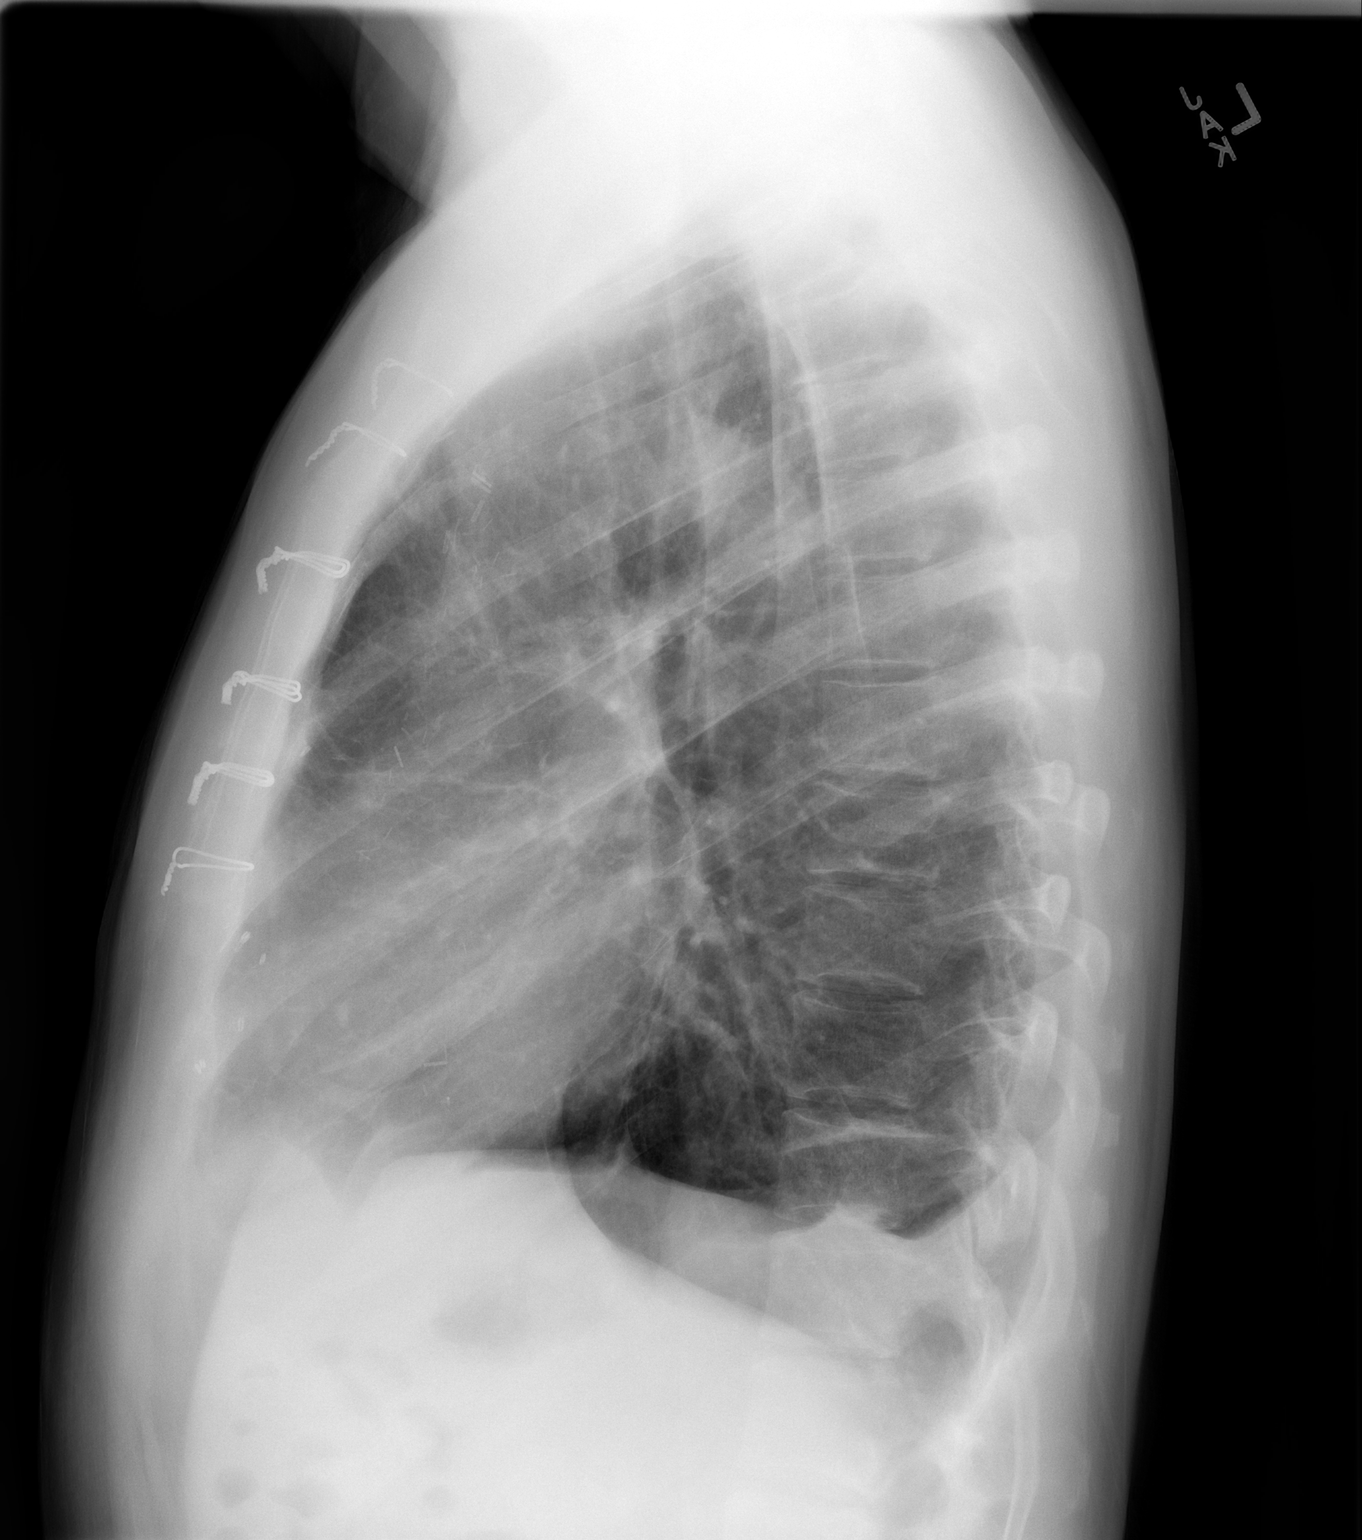

[2 of 2 positions shown; findings below may reference images not displayed]

FINDINGS: Lung volumes are normal. No consolidative airspace disease. No
pleural effusions. No pneumothorax. No pulmonary nodule or mass
noted. Pulmonary vasculature and the cardiomediastinal silhouette
are within normal limits. Aortic atherosclerosis. Status post median
sternotomy for CABG.
IMPRESSION: No radiographic evidence of acute cardiopulmonary disease.

## 2017-07-23 ENCOUNTER — Other Ambulatory Visit: Payer: Self-pay | Admitting: Cardiology

## 2017-08-14 ENCOUNTER — Other Ambulatory Visit: Payer: Self-pay | Admitting: Interventional Cardiology

## 2017-08-31 IMAGING — CR DG CHEST 2V
2 series · 2 of 2 positions shown · non-contrast
Comparison: Chest radiograph August 24, 2016

CLINICAL DATA: Mid chest pain tonight, status post open heart
surgery 10 weeks ago.

EXAM:
CHEST  2 VIEW

[chest pa]
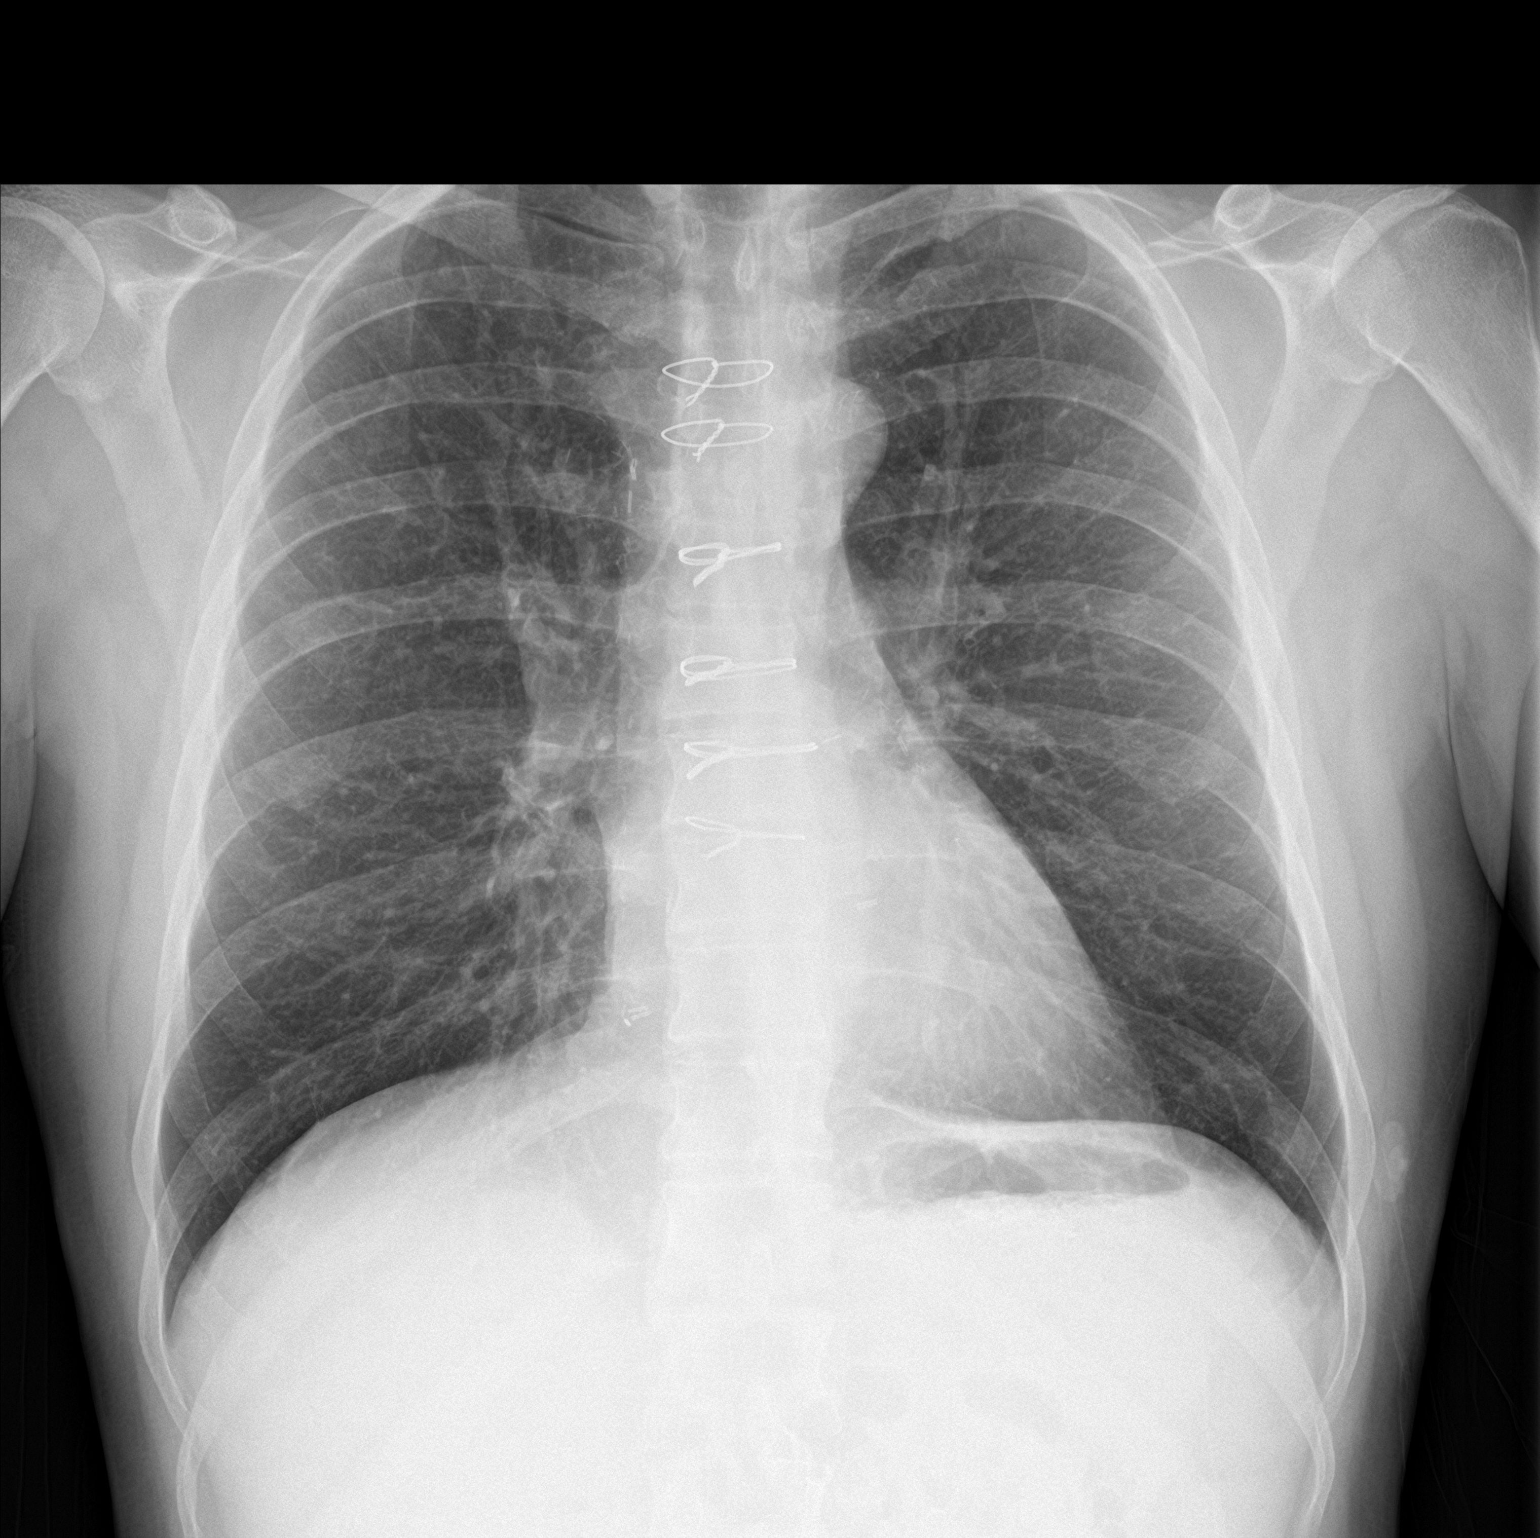

[chest lat]
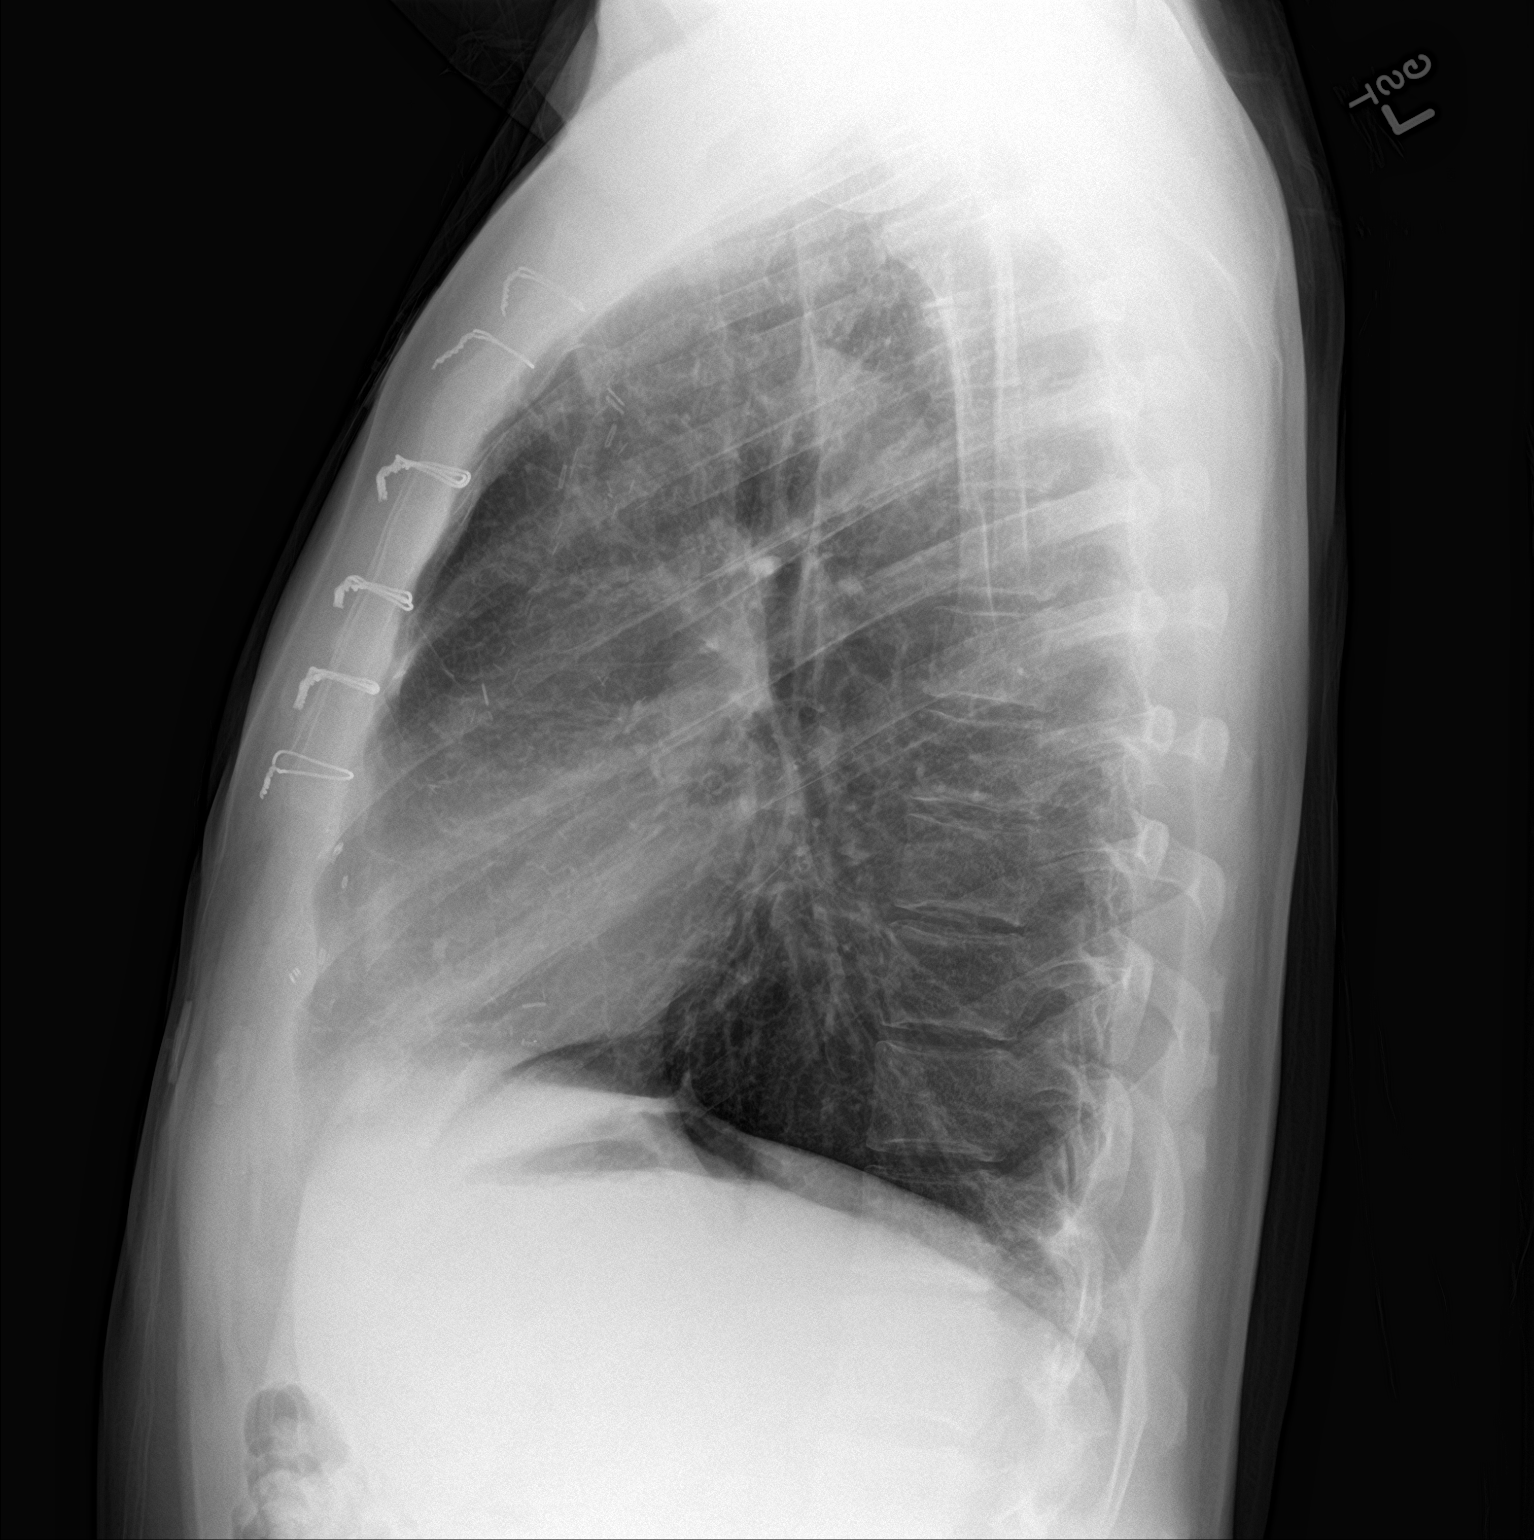

[2 of 2 positions shown; findings below may reference images not displayed]

FINDINGS: Cardiomediastinal silhouette is normal. Status post median
sternotomy for CABG. No pleural effusions or focal consolidations.
Trachea projects midline and there is no pneumothorax. Soft tissue
planes and included osseous structures are non-suspicious.
IMPRESSION: Stable examination: Status post CABG, no acute cardiopulmonary
process.

## 2017-09-24 ENCOUNTER — Other Ambulatory Visit: Payer: Self-pay | Admitting: Family Medicine

## 2018-05-05 ENCOUNTER — Other Ambulatory Visit: Payer: Self-pay | Admitting: Interventional Cardiology

## 2018-05-17 ENCOUNTER — Other Ambulatory Visit: Payer: Self-pay | Admitting: Family Medicine

## 2018-06-08 ENCOUNTER — Encounter: Payer: Self-pay | Admitting: Interventional Cardiology

## 2018-06-21 NOTE — Progress Notes (Deleted)
Cardiology Office Note   Date:  06/21/2018   ID:  Zachary Cuevas, DOB May 20, 1966, MRN [id number]  PCP:  Donita BrooksPickard, Warren T, MD    No chief complaint on file.    Wt Readings from Last 3 Encounters:  05/29/17 178 lb (80.7 kg)  11/28/16 182 lb 6.4 oz (82.7 kg)  09/28/16 177 lb 3.2 oz (80.4 kg)       History of Present Illness: Zachary Cuevas is a 53 y.o. male  with past medical history of hypertension hyperlipidemia and strong family history of premature CAD. In Feb 2018, he had anon-STEMI. He was found to have severe two-vessel coronary artery disease, requiringCABG surgery. EF was 35-40% by cath estimate. His CABG was performed by Dr. Darci NeedleHendricksonon Feb15th 2018. He underwent CABG x 2 with LIMA to LAD and RIMA to RCA. Operative TEE showed an EF of 45-50%. Postoperative course was uncomplicated. No issues with any atrial arrhythmias. He was discharged home on 07/17/2016.  He had more chest pain/arm pain on 09/25/2016 and went to ER. HE described it as a feeling like he was lifting weights all day. He felt some pain in his chest. BP was 180/115 at home. He went to the ER at that point. Troponins were negative. THe pain had resolved. He was sent home. ECG was not changed. He felt better by the time he was at the ER.    Past Medical History:  Diagnosis Date  . Allergy    hay fever  . Hypercholesteremia   . Hypertension     Past Surgical History:  Procedure Laterality Date  . CORONARY ARTERY BYPASS GRAFT N/A 07/14/2016   Procedure: CORONARY ARTERY BYPASS GRAFTING (CABG) x 2 WITH BILATERAL IMA;  Surgeon: Loreli SlotSteven C Hendrickson, MD;  [facility];  Service: Open Heart Surgery;  Laterality: N/A;  . CORONARY BALLOON ANGIOPLASTY N/A 07/13/2016   Procedure: Coronary Balloon Angioplasty;  Surgeon: Iran OuchMuhammad A Arida, MD;  Location: [facility];  Service: Cardiovascular;  Laterality: N/A;  . FRACTURE SURGERY  2005   boxer fracture  of hands  . LEFT HEART CATH AND  CORONARY ANGIOGRAPHY N/A 07/13/2016   Procedure: Left Heart Cath and Coronary Angiography;  Surgeon: Iran OuchMuhammad A Arida, MD;  Location: [facility];  Service: Cardiovascular;  Laterality: N/A;  . TEE WITHOUT CARDIOVERSION N/A 07/14/2016   Procedure: TRANSESOPHAGEAL ECHOCARDIOGRAM (TEE);  Surgeon: Loreli SlotSteven C Hendrickson, MD;  [facility];  Service: Open Heart Surgery;  Laterality: N/A;     Current Outpatient Medications  Medication Sig Dispense Refill  . acetaminophen (TYLENOL) 325 MG tablet Take 2 tablets (650 mg total) by mouth every 6 (six) hours as needed for mild pain.    Marland Kitchen. aspirin 81 MG EC tablet Take 1 tablet (81 mg total) by mouth daily.    Marland Kitchen. atorvastatin (LIPITOR) 40 MG tablet TAKE 1 TABLET BY MOUTH DAILY 90 tablet 3  . esomeprazole (NEXIUM) 40 MG capsule TAKE 1 CAPSULE BY MOUTH EVERY DAY 90 capsule 1  . losartan (COZAAR) 50 MG tablet Take 1 tablet (50 mg total) by mouth daily. 90 tablet 3  . metoprolol tartrate (LOPRESSOR) 25 MG tablet TAKE 1 TABLET BY MOUTH TWICE A DAY 180 tablet 0   No current facility-administered medications for this visit.     Allergies:   Penicillins    Social History:  The patient  reports that he quit smoking about 6 years ago. He has never used smokeless tobacco. He reports current drug use. Drug:  Marijuana. He reports that he does not drink alcohol.   Family History:  The patient's ***family history includes CAD (age of onset: 7432) in his mother; Diabetes in his brother and paternal grandfather; Heart disease in his maternal grandfather and paternal grandfather; Heart disease (age of onset: 3348) in his father; Miscarriages / [place] in his mother.    ROS:  Please see the history of present illness.   Otherwise, review of systems are positive for ***.   All other systems are reviewed and negative.    PHYSICAL EXAM: VS:  There were no vitals taken for this visit. , BMI There is no height or weight on file to calculate BMI. GEN: Well nourished,  well developed, in no acute distress HEENT: normal Neck: no JVD, carotid bruits, or masses Cardiac: ***RRR; no murmurs, rubs, or gallops,no edema  Respiratory:  clear to auscultation bilaterally, normal work of breathing GI: soft, nontender, nondistended, + BS MS: no deformity or atrophy Skin: warm and dry, no rash Neuro:  Strength and sensation are intact Psych: euthymic mood, full affect   EKG:   The ekg ordered today demonstrates ***   Recent Labs: No results found for requested labs within last 8760 hours.   Lipid Panel    Component Value Date/Time   CHOL 113 01/18/2017 0732   TRIG 215 (H) 01/18/2017 0732   HDL 28 (L) 01/18/2017 0732   CHOLHDL 4.0 01/18/2017 0732   CHOLHDL 4.4 07/14/2016 0046   VLDL 31 07/14/2016 0046   LDLCALC 42 01/18/2017 0732     Other studies Reviewed: Additional studies/ records that were reviewed today with results demonstrating: ***.   ASSESSMENT AND PLAN:  1. CAD: 2. Hypertensive heart disease: 3. Hyperlipidemia: 4. Family h/o CAD:   Current medicines are reviewed at length with the patient today.  The patient concerns regarding his medicines were addressed.  The following changes have been made:  No change***  Labs/ tests ordered today include: *** No orders of the defined types were placed in this encounter.   Recommend 150 minutes/week of aerobic exercise Low fat, low carb, high fiber diet recommended  Disposition:   FU in ***   Signed, Lance MussJayadeep Dallys Nowakowski, MD  06/21/2018 10:11 AM    [facility] [address], [place], [place]  [address] Phone: [phone]; Fax: [phone]

## 2018-06-26 ENCOUNTER — Other Ambulatory Visit: Payer: Self-pay | Admitting: Interventional Cardiology

## 2018-06-29 ENCOUNTER — Ambulatory Visit: Payer: BLUE CROSS/BLUE SHIELD | Admitting: Interventional Cardiology

## 2018-08-05 ENCOUNTER — Other Ambulatory Visit: Payer: Self-pay | Admitting: Cardiovascular Disease

## 2018-08-22 ENCOUNTER — Other Ambulatory Visit: Payer: Self-pay | Admitting: Interventional Cardiology

## 2018-08-30 ENCOUNTER — Other Ambulatory Visit: Payer: Self-pay | Admitting: Cardiovascular Disease

## 2018-09-03 ENCOUNTER — Other Ambulatory Visit: Payer: Self-pay | Admitting: Cardiovascular Disease

## 2019-06-30 NOTE — Progress Notes (Signed)
Cardiology Office Note   Date:  07/02/2019   ID:  Zachary Cuevas, DOB Jun 09, 1965, MRN [id number]  PCP:  Donita Brooks, MD    No chief complaint on file.  CAD  Wt Readings from Last 3 Encounters:  07/02/19 180 lb 6.4 oz (81.8 kg)  05/29/17 178 lb (80.7 kg)  11/28/16 182 lb 6.4 oz (82.7 kg)       History of Present Illness: Zachary Cuevas is a 54 y.o. male  with past medical history of hypertension hyperlipidemia and strong family history of premature CAD. In Feb 2018, he had anon-STEMI. He was found to have severe two-vessel coronary artery disease, requiringCABG surgery. EF was 35-40% by cath estimate. His CABG was performed by Dr. Darci Needle Feb15th 2018. He underwent CABG x 2 with LIMA to LAD and RIMA to RCA. Operative TEE showed an EF of 45-50%. Postoperative course was uncomplicated. No issues with any atrial arrhythmias. He was discharged home on 07/17/2016.  In the past, he reported :"He gets a lot of activity with his job.  He has to lift a lot, up to 50 lbs.  He works in Health and safety inspector.    He thinks he could improve his diet.  He has decreased fast food intake, but not completely.  "  ACE-I was switched to losartan due to cough.  Since the last visit, he continues to be active at work.  Denies : Chest pain. Dizziness. Leg edema. Nitroglycerin use. Orthopnea. Palpitations. Paroxysmal nocturnal dyspnea. Shortness of breath. Syncope.   He works in Health and safety inspector.    No COVID sx. He got tested a few months ago and was negative.   He has been off of medicine due to loss of insurance.   He is trying to eat healthy.    Past Medical History:  Diagnosis Date  . Allergy    hay fever  . Hypercholesteremia   . Hypertension     Past Surgical History:  Procedure Laterality Date  . CORONARY ARTERY BYPASS GRAFT N/A 07/14/2016   Procedure: CORONARY ARTERY BYPASS GRAFTING (CABG) x 2 WITH BILATERAL IMA;  Surgeon: Loreli Slot, MD;   Location: [facility] OR;  Service: Open Heart Surgery;  Laterality: N/A;  . CORONARY BALLOON ANGIOPLASTY N/A 07/13/2016   Procedure: Coronary Balloon Angioplasty;  Surgeon: Iran Ouch, MD;  [facility];  Service: Cardiovascular;  Laterality: N/A;  . FRACTURE SURGERY  2005   boxer fracture  of hands  . LEFT HEART CATH AND CORONARY ANGIOGRAPHY N/A 07/13/2016   Procedure: Left Heart Cath and Coronary Angiography;  Surgeon: Iran Ouch, MD;  [facility];  Service: Cardiovascular;  Laterality: N/A;  . TEE WITHOUT CARDIOVERSION N/A 07/14/2016   Procedure: TRANSESOPHAGEAL ECHOCARDIOGRAM (TEE);  Surgeon: Loreli Slot, MD;  Location: [facility] OR;  Service: Open Heart Surgery;  Laterality: N/A;     Current Outpatient Medications  Medication Sig Dispense Refill  . acetaminophen (TYLENOL) 325 MG tablet Take 2 tablets (650 mg total) by mouth every 6 (six) hours as needed for mild pain.    Marland Kitchen aspirin 81 MG EC tablet Take 1 tablet (81 mg total) by mouth daily. (Patient not taking: Reported on 07/02/2019)    . atorvastatin (LIPITOR) 40 MG tablet Take 1 tablet (40 mg total) by mouth daily. Please call an schedule an office visit (Patient not taking: Reported on 07/02/2019) 90 tablet 0  . esomeprazole (NEXIUM) 40 MG capsule TAKE 1 CAPSULE BY MOUTH EVERY DAY (  Patient not taking: Reported on 07/02/2019) 90 capsule 1  . losartan (COZAAR) 50 MG tablet Take 1 tablet (50 mg total) by mouth daily. Please keep upcoming appt with Dr. Eldridge Dace in January for future refills. Thank you (Patient not taking: Reported on 07/02/2019) 90 tablet 0  . metoprolol tartrate (LOPRESSOR) 25 MG tablet TAKE 1 TABLET BY MOUTH TWICE A DAY (Patient not taking: Reported on 07/02/2019) 180 tablet 0   No current facility-administered medications for this visit.    Allergies:   Penicillins    Social History:  The patient  reports that he quit smoking about 7 years ago. He has never used smokeless tobacco. He  reports current drug use. Drug: Marijuana. He reports that he does not drink alcohol.   Family History:  The patient's family history includes CAD (age of onset: 86) in his mother; Diabetes in his brother and paternal grandfather; Heart disease in his maternal grandfather and paternal grandfather; Heart disease (age of onset: 39) in his father; Miscarriages / [place] in his mother.    ROS:  Please see the history of present illness.   Otherwise, review of systems are positive for occasional headaches.   All other systems are reviewed and negative.    PHYSICAL EXAM: VS:  BP (!) 148/96   Pulse 86   Ht 6\' 2"  (1.88 m)   Wt 180 lb 6.4 oz (81.8 kg)   SpO2 98%   BMI 23.16 kg/m  , BMI Body mass index is 23.16 kg/m. GEN: Well nourished, well developed, in no acute distress  HEENT: normal  Neck: no JVD, carotid bruits, or masses Cardiac: RRR; no murmurs, rubs, or gallops,no edema  Respiratory:  clear to auscultation bilaterally, normal work of breathing GI: soft, nontender, nondistended, + BS MS: no deformity or atrophy  Skin: warm and dry, no rash Neuro:  Strength and sensation are intact Psych: euthymic mood, full affect   EKG:   The ekg ordered today demonstrates NSR, no ST changes   Recent Labs: No results found for requested labs within last 8760 hours.   Lipid Panel    Component Value Date/Time   CHOL 113 01/18/2017 0732   TRIG 215 (H) 01/18/2017 0732   HDL 28 (L) 01/18/2017 0732   CHOLHDL 4.0 01/18/2017 0732   CHOLHDL 4.4 07/14/2016 0046   VLDL 31 07/14/2016 0046   LDLCALC 42 01/18/2017 0732     Other studies Reviewed: Additional studies/ records that were reviewed today with results demonstrating: no recent labs available.   ASSESSMENT AND PLAN:  1. CAD: No angina.  He has been off of secondary prevention due to insurance issues.  Restart baby aspirin.  Healthy, plant-based diet recommended.  Avoid processed foods. 2. Hypertensive heart disease: Avoid salt.   Restart losartan 50 mg daily.  We will check electrolytes in 2 weeks. 3. Hyperlipidemia: Restart atorvastatin.  Will check lipids in a few weeks. 4. Headaches: They have decreased in frequency. 5. Family h/o CAD: We talked about the importance of prevention.  Managing blood pressure and lipids along with healthy diet and regular exercise should prevent more CAD.  I explained that if he had difficulty getting his medicines, we would try to help him.   Current medicines are reviewed at length with the patient today.  The patient concerns regarding his medicines were addressed.  The following changes have been made:  As above  Labs/ tests ordered today include:  No orders of the defined types were placed in this encounter.  Recommend 150 minutes/week of aerobic exercise Low fat, low carb, high fiber diet recommended  Disposition:   FU in 1 year, check BP outside of MDs office and send report back via MyChart.   Signed, Larae Grooms, MD  07/02/2019 9:24 AM    [facility] [place], [place], [place]  [address] Phone: [phone]; Fax: [phone]

## 2019-07-02 ENCOUNTER — Encounter: Payer: Self-pay | Admitting: Interventional Cardiology

## 2019-07-02 ENCOUNTER — Other Ambulatory Visit: Payer: Self-pay

## 2019-07-02 ENCOUNTER — Ambulatory Visit (INDEPENDENT_AMBULATORY_CARE_PROVIDER_SITE_OTHER): Payer: Self-pay | Admitting: Interventional Cardiology

## 2019-07-02 VITALS — BP 148/96 | HR 86 | Ht 74.0 in | Wt 180.4 lb

## 2019-07-02 DIAGNOSIS — I119 Hypertensive heart disease without heart failure: Secondary | ICD-10-CM

## 2019-07-02 DIAGNOSIS — I25118 Atherosclerotic heart disease of native coronary artery with other forms of angina pectoris: Secondary | ICD-10-CM

## 2019-07-02 DIAGNOSIS — E782 Mixed hyperlipidemia: Secondary | ICD-10-CM

## 2019-07-02 MED ORDER — LOSARTAN POTASSIUM 50 MG PO TABS: 50.0000 mg | ORAL_TABLET | Freq: Every day | ORAL | 3 refills | Status: DC

## 2019-07-02 MED ORDER — ASPIRIN 81 MG PO TBEC: 81.0000 mg | DELAYED_RELEASE_TABLET | Freq: Every day | ORAL | 3 refills | Status: DC

## 2019-07-02 MED ORDER — ATORVASTATIN CALCIUM 40 MG PO TABS: 40.0000 mg | ORAL_TABLET | Freq: Every day | ORAL | 3 refills | Status: DC

## 2019-07-02 NOTE — Patient Instructions (Signed)
Medication Instructions:  Your physician has recommended you make the following change in your medication:   1. RESTART: atorvastatin (lipitor) 40 mg tablet: Take 1 tablet by mouth once a day  2. RESTART: enteric coated aspirin 81 mg tablet: Take 1 tablet by mouth once a day with food  3. RESTART: losartan (cozaar) 50 mg tablet: Take 1 tablet by mouth once a day  *If you need a refill on your cardiac medications before your next appointment, please call your pharmacy*  Lab Work: Your physician recommends that you return for a FASTING lipid profile and liver function panel in 2 weeks  If you have labs (blood work) drawn today and your tests are completely normal, you will receive your results only by: Marland Kitchen MyChart Message (if you have MyChart) OR . A paper copy in the mail If you have any lab test that is abnormal or we need to change your treatment, we will call you to review the results.  Testing/Procedures: None ordered  Follow-Up: At [facility], you and your health needs are our priority.  As part of our continuing mission to provide you with exceptional heart care, we have created designated Provider Care Teams.  These Care Teams include your primary Cardiologist (physician) and Advanced Practice Providers (APPs -  Physician Assistants and Nurse Practitioners) who all work together to provide you with the care you need, when you need it.  Your next appointment:   12 month(s)  The format for your next appointment:   In Person  Provider:   You may see Lance Muss, MD or one of the following Advanced Practice Providers on your designated Care Team:    Ronie Spies, PA-C  Jacolyn Reedy, PA-C   Other Instructions Your physician has requested that you regularly monitor and record your blood pressure readings at home. Please use the same machine at the same time of day to check your readings and send a MyChart message with your readings.

## 2019-07-19 ENCOUNTER — Other Ambulatory Visit: Payer: Self-pay

## 2019-12-11 ENCOUNTER — Other Ambulatory Visit: Payer: Self-pay

## 2019-12-11 ENCOUNTER — Ambulatory Visit (HOSPITAL_COMMUNITY)
Admission: EM | Admit: 2019-12-11 | Discharge: 2019-12-11 | Disposition: A | Discharge status: Home / Self Care | Payer: Self-pay | Attending: Emergency Medicine | Admitting: Emergency Medicine

## 2019-12-11 DIAGNOSIS — H6122 Impacted cerumen, left ear: Secondary | ICD-10-CM

## 2019-12-11 NOTE — Discharge Instructions (Signed)
Wax removed Tylenol and ibuprofen as needed for pain Follow up if symptoms returning

## 2019-12-11 NOTE — ED Triage Notes (Signed)
Pt c/o difficulty hearing out of left ear x 6 days, states he put ear drops in that helped with pain but still cannot hear

## 2019-12-12 NOTE — ED Provider Notes (Signed)
[facility]    CSN: [id number] Arrival date & time: 12/11/19  1755      History   Chief Complaint Chief Complaint  Patient presents with  . Otalgia    HPI Zachary Cuevas is a 54 y.o. male history of hypertension presenting today for evaluation of decreased hearing out of left ear.  Patient reports over the past week he has had pressure on his eardrum as well as muffled sounding hearing.  He denies significant pain.  Tried using over-the-counter drops without relief.  Denies associated URI symptoms.  HPI  Past Medical History:  Diagnosis Date  . Allergy    hay fever  . Hypercholesteremia   . Hypertension     Patient Active Problem List   Diagnosis Date Noted  . Hyperlipidemia 09/28/2016  . Hypertensive heart disease 09/28/2016  . S/P CABG x 2   . Coronary artery disease 07/14/2016  . Non-STEMI (non-ST elevated myocardial infarction) (HCC) 07/13/2016  . Allergy     Past Surgical History:  Procedure Laterality Date  . CORONARY ARTERY BYPASS GRAFT N/A 07/14/2016   Procedure: CORONARY ARTERY BYPASS GRAFTING (CABG) x 2 WITH BILATERAL IMA;  Surgeon: Loreli Slot, MD;  Location: [facility] OR;  Service: Open Heart Surgery;  Laterality: N/A;  . CORONARY BALLOON ANGIOPLASTY N/A 07/13/2016   Procedure: Coronary Balloon Angioplasty;  Surgeon: Iran Ouch, MD;  Location: [facility];  Service: Cardiovascular;  Laterality: N/A;  . FRACTURE SURGERY  2005   boxer fracture  of hands  . LEFT HEART CATH AND CORONARY ANGIOGRAPHY N/A 07/13/2016   Procedure: Left Heart Cath and Coronary Angiography;  Surgeon: Iran Ouch, MD;  Location: [facility];  Service: Cardiovascular;  Laterality: N/A;  . TEE WITHOUT CARDIOVERSION N/A 07/14/2016   Procedure: TRANSESOPHAGEAL ECHOCARDIOGRAM (TEE);  Surgeon: Loreli Slot, MD;  Location: [facility] OR;  Service: Open Heart Surgery;  Laterality: N/A;       Home Medications    Prior to Admission medications     Medication Sig Start Date End Date Taking? Authorizing Provider  acetaminophen (TYLENOL) 325 MG tablet Take 2 tablets (650 mg total) by mouth every 6 (six) hours as needed for mild pain. 07/18/16   Barrett, Rae Roam, PA-C  aspirin 81 MG EC tablet Take 1 tablet (81 mg total) by mouth daily. 07/02/19   Corky Crafts, MD  atorvastatin (LIPITOR) 40 MG tablet Take 1 tablet (40 mg total) by mouth daily. 07/02/19   Corky Crafts, MD  losartan (COZAAR) 50 MG tablet Take 1 tablet (50 mg total) by mouth daily. 07/02/19   Corky Crafts, MD    Family History Family History  Problem Relation Age of Onset  . Miscarriages / [place] Mother   . CAD Mother 60  . Heart disease Maternal Grandfather   . Diabetes Paternal Grandfather   . Heart disease Paternal Grandfather   . Heart disease Father 28  . Diabetes Brother     Social History Social History   Tobacco Use  . Smoking status: Former Smoker    Quit date: 08/11/2011    Years since quitting: 8.3  . Smokeless tobacco: Never Used  Vaping Use  . Vaping Use: Never used  Substance Use Topics  . Alcohol use: No  . Drug use: Yes    Types: Marijuana     Allergies   Penicillins   Review of Systems Review of Systems  Constitutional: Negative for activity change, appetite change, chills, fatigue  and fever.  HENT: Positive for hearing loss. Negative for congestion, ear pain, rhinorrhea, sinus pressure, sore throat and trouble swallowing.   Eyes: Negative for discharge and redness.  Respiratory: Negative for cough, chest tightness and shortness of breath.   Cardiovascular: Negative for chest pain.  Gastrointestinal: Negative for abdominal pain, diarrhea, nausea and vomiting.  Musculoskeletal: Negative for myalgias.  Skin: Negative for rash.  Neurological: Negative for dizziness, light-headedness and headaches.     Physical Exam Triage Vital Signs ED Triage Vitals [12/11/19 1904]  Enc Vitals Group     BP (!) 159/109      Pulse Rate 88     Resp 16     Temp      Temp src      SpO2 97 %     Weight      Height      Head Circumference      Peak Flow      Pain Score 0     Pain Loc      Pain Edu?      Excl. in GC?    No data found.  Updated Vital Signs BP (!) 155/105 (BP Location: Left Arm)   Pulse 85   Resp 16   SpO2 97%   Visual Acuity Right Eye Distance:   Left Eye Distance:   Bilateral Distance:    Right Eye Near:   Left Eye Near:    Bilateral Near:     Physical Exam Vitals and nursing note reviewed.  Constitutional:      Appearance: He is well-developed.     Comments: No acute distress  HENT:     Head: Normocephalic and atraumatic.     Ears:     Comments: Cerumen impaction noted on left canal, TM not initially visualized, after removal TM does appear dull, but no significant erythema, small amount of trauma and irritation noted in EAC after cerumen removal    Nose: Nose normal.  Eyes:     Conjunctiva/sclera: Conjunctivae normal.  Cardiovascular:     Rate and Rhythm: Normal rate.  Pulmonary:     Effort: Pulmonary effort is normal. No respiratory distress.  Abdominal:     General: There is no distension.  Musculoskeletal:        General: Normal range of motion.     Cervical back: Neck supple.  Skin:    General: Skin is warm and dry.  Neurological:     Mental Status: He is alert and oriented to person, place, and time.      UC Treatments / Results  Labs (all labs ordered are listed, but only abnormal results are displayed) Labs Reviewed - No data to display  EKG   Radiology No results found.  Procedures Procedures (including critical care time)  Medications Ordered in UC Medications - No data to display  Initial Impression / Assessment and Plan / UC Course  I have reviewed the triage vital signs and the nursing notes.  Pertinent labs & imaging results that were available during my care of the patient were reviewed by me and considered in my medical decision  making (see chart for details).     Initially cerumen removal performed by myself with curette, small amount of irritation and trauma to canal, opted to proceed with irrigation by nursing staff.  Successful removal of cerumen with complete resolution of symptoms.  No immediate complications, advised to keep area clean and dry monitor for development of any increased pain or swelling/secondary ear  infection.  Discussed strict return precautions. Patient verbalized understanding and is agreeable with plan.  Final Clinical Impressions(s) / UC Diagnoses   Final diagnoses:  Hearing loss of left ear due to cerumen impaction     Discharge Instructions     Wax removed Tylenol and ibuprofen as needed for pain Follow up if symptoms returning   ED Prescriptions    None     PDMP not reviewed this encounter.   Lew Dawes, [place] 12/12/19 [phone]

## 2019-12-25 ENCOUNTER — Ambulatory Visit (HOSPITAL_COMMUNITY)
Admission: EM | Admit: 2019-12-25 | Discharge: 2019-12-25 | Disposition: A | Discharge status: Home / Self Care | Payer: Self-pay

## 2019-12-25 ENCOUNTER — Encounter (HOSPITAL_COMMUNITY): Payer: Self-pay

## 2019-12-25 ENCOUNTER — Other Ambulatory Visit: Payer: Self-pay

## 2019-12-25 DIAGNOSIS — I252 Old myocardial infarction: Secondary | ICD-10-CM

## 2019-12-25 DIAGNOSIS — I2510 Atherosclerotic heart disease of native coronary artery without angina pectoris: Secondary | ICD-10-CM

## 2019-12-25 DIAGNOSIS — Z951 Presence of aortocoronary bypass graft: Secondary | ICD-10-CM

## 2019-12-25 DIAGNOSIS — R0789 Other chest pain: Secondary | ICD-10-CM

## 2019-12-25 MED ORDER — FAMOTIDINE 20 MG PO TABS
20.0000 mg | ORAL_TABLET | Freq: Two times a day (BID) | ORAL | 0 refills | Status: DC
Start: 2019-12-25 — End: 2023-07-04

## 2019-12-25 NOTE — Discharge Instructions (Addendum)
Your ekg at this moment does not show signs of a heart attack. However, this can change very quickly. In the emergency you can have more of an evaluation to rule out a heart event, heart attack. If you go home, you risk getting worse. Managing your symptoms for gastritis, acid reflux is an option but a heart attack is a life threatening that we cannot definitively rule out in the urgent care setting. If you develop even the slightest of symptoms, you have had before, please report to the emergency room immediately either by ambulance or if someone you know that can remain calm can drive you there.

## 2019-12-25 NOTE — ED Provider Notes (Signed)
[facility]   MRN: [id number] DOB: Dec 12, 1965  Subjective:   Zachary Cuevas is a 54 y.o. male with past medical history of CAD, NSTEMI, status post CABG x2 in 2018 presenting for an episode of chest pressure/pain this morning.  Patient states that he was going about his morning routine, getting ready for work.  He felt a sudden pressure across his chest, states that he got really anxious and nervous about a recurrent heart attack.  He subsequently became sweaty, had nausea and one episode of vomiting.  Thereafter he continued to have the chest pressure and then burped very harshly.  After he burped states that his symptoms completely resolved and has not had recurrence.  States that with his history of having an NSTEMI and 2 heart attacks, states that this particular episode was very different from the previous one but wanted to come in to get checked.  He is compliant with all his medications including aspirin, atorvastatin, losartan.  Denies smoking cigarettes.  No current facility-administered medications for this encounter.  Current Outpatient Medications:    acetaminophen (TYLENOL) 325 MG tablet, Take 2 tablets (650 mg total) by mouth every 6 (six) hours as needed for mild pain., Disp: , Rfl:    aspirin 81 MG EC tablet, Take 1 tablet (81 mg total) by mouth daily., Disp: 90 tablet, Rfl: 3   atorvastatin (LIPITOR) 40 MG tablet, Take 1 tablet (40 mg total) by mouth daily., Disp: 90 tablet, Rfl: 3   losartan (COZAAR) 50 MG tablet, Take 1 tablet (50 mg total) by mouth daily., Disp: 90 tablet, Rfl: 3   Multiple Vitamin (MULTIVITAMIN) capsule, Take 1 capsule by mouth daily., Disp: , Rfl:     Allergies  Allergen Reactions   Penicillins Other (See Comments)    Childhood allergy - does not remember exact reaction    Past Medical History:  Diagnosis Date   Allergy    hay fever   Hypercholesteremia    Hypertension      Past Surgical History:  Procedure Laterality Date     CORONARY ARTERY BYPASS GRAFT N/A 07/14/2016   Procedure: CORONARY ARTERY BYPASS GRAFTING (CABG) x 2 WITH BILATERAL IMA;  Surgeon: Loreli Slot, MD;  Location: [facility] OR;  Service: Open Heart Surgery;  Laterality: N/A;   CORONARY BALLOON ANGIOPLASTY N/A 07/13/2016   Procedure: Coronary Balloon Angioplasty;  Surgeon: Iran Ouch, MD;  [facility];  Service: Cardiovascular;  Laterality: N/A;   FRACTURE SURGERY  2005   boxer fracture  of hands   LEFT HEART CATH AND CORONARY ANGIOGRAPHY N/A 07/13/2016   Procedure: Left Heart Cath and Coronary Angiography;  Surgeon: Iran Ouch, MD;  [facility];  Service: Cardiovascular;  Laterality: N/A;   TEE WITHOUT CARDIOVERSION N/A 07/14/2016   Procedure: TRANSESOPHAGEAL ECHOCARDIOGRAM (TEE);  Surgeon: Loreli Slot, MD;  Location: [facility] OR;  Service: Open Heart Surgery;  Laterality: N/A;    Family History  Problem Relation Age of Onset   Miscarriages / Stillbirths Mother    CAD Mother 74   Heart disease Maternal Grandfather    Diabetes Paternal Grandfather    Heart disease Paternal Grandfather    Heart disease Father 58   Diabetes Brother     Social History   Tobacco Use   Smoking status: Former Smoker    Quit date: 08/11/2011    Years since quitting: 8.3   Smokeless tobacco: Never Used  Vaping Use   Vaping Use: Some days  Substance Use Topics   Alcohol use: No   Drug use: Yes    Frequency: 7.0 times per week    Types: Marijuana    ROS   Objective:   Vitals: BP (!) 127/87 (BP Location: Left Arm)    Pulse 82    Temp 97.6 F (36.4 C) (Oral)    Resp 18    SpO2 98%   Wt Readings from Last 3 Encounters:  07/02/19 180 lb 6.4 oz (81.8 kg)  05/29/17 178 lb (80.7 kg)  11/28/16 182 lb 6.4 oz (82.7 kg)   Temp Readings from Last 3 Encounters:  12/25/19 97.6 F (36.4 C) (Oral)  09/25/16 97.5 F (36.4 C) (Oral)  07/18/16 98.7 F (37.1 C) (Oral)   BP Readings from Last 3  Encounters:  12/25/19 (!) 127/87  12/11/19 (!) 155/105  07/02/19 (!) 148/96   Pulse Readings from Last 3 Encounters:  12/25/19 82  12/11/19 85  07/02/19 86   Physical Exam Constitutional:      General: He is not in acute distress.    Appearance: Normal appearance. He is well-developed. He is not ill-appearing, toxic-appearing or diaphoretic.  HENT:     Head: Normocephalic and atraumatic.     Right Ear: External ear normal.     Left Ear: External ear normal.     Nose: Nose normal.     Mouth/Throat:     Mouth: Mucous membranes are moist.     Pharynx: Oropharynx is clear.  Eyes:     General: No scleral icterus.       Right eye: No discharge.        Left eye: No discharge.     Extraocular Movements: Extraocular movements intact.     Conjunctiva/sclera: Conjunctivae normal.     Pupils: Pupils are equal, round, and reactive to light.  Neck:     Vascular: No carotid bruit.  Cardiovascular:     Rate and Rhythm: Normal rate and regular rhythm.     Heart sounds: Normal heart sounds. No murmur heard.  No systolic murmur is present.  No diastolic murmur is present.  No friction rub. No gallop.   Pulmonary:     Effort: Pulmonary effort is normal. No respiratory distress.     Breath sounds: Normal breath sounds. No stridor. No wheezing, rhonchi or rales.  Abdominal:     General: Bowel sounds are normal. There is no distension.     Palpations: Abdomen is soft. There is no mass.     Tenderness: There is no abdominal tenderness. There is no right CVA tenderness, left CVA tenderness, guarding or rebound.  Musculoskeletal:     Cervical back: Normal range of motion and neck supple.  Skin:    General: Skin is warm and dry.  Neurological:     Mental Status: He is alert and oriented to person, place, and time.  Psychiatric:        Mood and Affect: Mood normal.        Behavior: Behavior normal.        Thought Content: Thought content normal.        Judgment: Judgment normal.     ED  ECG REPORT   Date: 12/25/2019  Rate: 64bpm  Rhythm: normal sinus rhythm  QRS Axis: normal  Intervals: normal  ST/T Wave abnormalities: normal  Conduction Disutrbances:none  Narrative Interpretation: Sinus rhythm at 64 bpm with nonspecific T wave flattening in lead aVL. Possible left atrial enlargement, unchanged from previous EKG in February 2021.  Old EKG Reviewed: unchanged  I have personally reviewed the EKG tracing and agree with the computerized printout as noted.   Assessment and Plan :   PDMP not reviewed this encounter.  1. Chest pressure   2. Coronary artery disease involving native heart without angina pectoris, unspecified vessel or lesion type   3. History of coronary artery bypass graft x 2   4. History of non-ST elevation myocardial infarction (NSTEMI)     I had an extensive discussion with patient about cardiac work-up to rule out a heart event.  Patient states that he knows troponins can be very useful but unfortunately have to be obtained in the emergency room.  Does not feel that this is a heart event at all.  States that his vital signs seem really good to him.  Reports that he thinks this came from eating a very heavy and large meal right before going to bed last night.  Counseled patient that having an undiagnosed heart event is a life-threatening emergency.  Despite the EKG not showing any changes, I explained to patient that it would be very useful to have troponins drawn.  He does not want to have this done at this time.  Therefore, I negotiated with patient to try Pepcid and report to the emergency room by EMS for personal vehicle should any symptoms return.   Wallis Bamberg, [place] 12/25/19 [phone]

## 2019-12-25 NOTE — ED Triage Notes (Signed)
Pt reports sudden onset of CP at 0600 this morning.  Was diaphoretic and nauseated.  Vomited and had a "huge burp" then felt CP go away.  Prev hx of MI.  States this CP does not feel like previous MI.  This morning's pain was a pressure across entire chest with no radiation.  Took a [employer] this morning for a HA.

## 2023-04-02 ENCOUNTER — Encounter: Payer: Self-pay | Admitting: Cardiovascular Disease

## 2023-04-02 NOTE — Progress Notes (Unsigned)
Cardiology Office Note:  .   Date:  04/03/2023  ID:  Zachary Cuevas, DOB 03/02/1966, MRN [id number] PCP: Donita Brooks, MD  [facility] Providers Cardiologist:  Lance Muss, MD    History of Present Illness: .    Nov. 4, 2024   Zachary Cuevas is a 57 y.o. male  previous patent of Dr. Eldridge Dace I am seeing him for the first time today  Hx of HTN, HLD Strong family hx of CAD  Cath in 2018 shows severe 2 V CAD, S/p CABG , CABG x 2 with LIMA to LAD and RIMA to RCA. Operative TEE showed an EF of 45-50% (07/14/16  [place])  Has not had insurance , has not been taking any meds   Works in Event organiser of ducts   No CP , no dyspnea Quit smoking with his CABG .    Still eats processed / salty meats     ROS:   Studies Reviewed: Marland Kitchen   EKG Interpretation Date/Time:  Monday April 03 2023 15:41:23 EST Ventricular Rate:  80 PR Interval:  148 QRS Duration:  86 QT Interval:  368 QTC Calculation: 424 R Axis:   57  Text Interpretation: Normal sinus rhythm Minimal voltage criteria for LVH, may be normal variant ( Sokolow-Lyon ) Septal infarct , age undetermined When compared with ECG of 25-Dec-2019 08:38, No significant change was found Confirmed by Kristeen Miss [phone]) on 04/03/2023 4:01:49 PM   EKG Interpretation Date/Time:  Monday April 03 2023 15:41:23 EST Ventricular Rate:  80 PR Interval:  148 QRS Duration:  86 QT Interval:  368 QTC Calculation: 424 R Axis:   57  Text Interpretation: Normal sinus rhythm Minimal voltage criteria for LVH, may be normal variant ( Sokolow-Lyon ) Septal infarct , age undetermined When compared with ECG of 25-Dec-2019 08:38, No significant change was found Confirmed by Kristeen Miss [phone]) on 04/03/2023 4:01:49 PM   Risk Assessment/Calculations:     HYPERTENSION CONTROL Vitals:   04/03/23 1546 04/03/23 1620  BP: (!) 146/108 (!) 150/110    The patient's blood pressure is elevated above target today.  In  order to address the patient's elevated BP: A new medication was prescribed today.          Physical Exam:   VS:  BP (!) 150/110   Pulse 86   Ht 6\' 1"  (1.854 m)   Wt 172 lb (78 kg)   SpO2 95%   BMI 22.69 kg/m    Wt Readings from Last 3 Encounters:  04/03/23 172 lb (78 kg)  07/02/19 180 lb 6.4 oz (81.8 kg)  05/29/17 178 lb (80.7 kg)    GEN: Well nourished, well developed in no acute distress NECK: No JVD; No carotid bruits   CARDIAC: RRR, no murmurs, rubs, gallops RESPIRATORY:  Clear to auscultation without rales, wheezing or rhonchi  ABDOMEN: Soft, non-tender, non-distended EXTREMITIES:  No edema; No deformity   ASSESSMENT AND PLAN: .   1.  Coronary artery disease: Makya status post coronary artery bypass grafting approximately 6 years ago.  Fortunately has not had any episodes of angina.  He he ran out of insurance and has not been taking any of his medications.  Will restart aspirin 81 mg a day, atorvastatin 40 mg a day   2.  Hypertension: Blood pressure is mildly elevated.  He still eats a fair amount of salt and salty foods.  I encouraged him to reduce his salt intake.  He gets a fair amount of exercise  during his regular job.  I encouraged him to get some regular cardio exercise as well.  Will have him return to see an APP in 3 months.       Dispo: 3 months with APP   Signed, Kristeen Miss, MD

## 2023-04-03 ENCOUNTER — Ambulatory Visit: Payer: Self-pay | Attending: Cardiovascular Disease | Admitting: Cardiovascular Disease

## 2023-04-03 ENCOUNTER — Encounter: Payer: Self-pay | Admitting: Cardiovascular Disease

## 2023-04-03 VITALS — BP 150/110 | HR 86 | Ht 73.0 in | Wt 172.0 lb

## 2023-04-03 DIAGNOSIS — I2510 Atherosclerotic heart disease of native coronary artery without angina pectoris: Secondary | ICD-10-CM

## 2023-04-03 DIAGNOSIS — E782 Mixed hyperlipidemia: Secondary | ICD-10-CM

## 2023-04-03 DIAGNOSIS — I10 Essential (primary) hypertension: Secondary | ICD-10-CM

## 2023-04-03 DIAGNOSIS — Z79899 Other long term (current) drug therapy: Secondary | ICD-10-CM

## 2023-04-03 MED ORDER — ATORVASTATIN CALCIUM 40 MG PO TABS: 40.0000 mg | ORAL_TABLET | Freq: Every day | ORAL | 3 refills | Status: DC

## 2023-04-03 MED ORDER — LOSARTAN POTASSIUM 50 MG PO TABS: 50.0000 mg | ORAL_TABLET | Freq: Every day | ORAL | 3 refills | Status: DC

## 2023-04-03 MED ORDER — ASPIRIN 81 MG PO TBEC: 81.0000 mg | DELAYED_RELEASE_TABLET | Freq: Every day | ORAL | Status: DC

## 2023-04-03 NOTE — Patient Instructions (Signed)
Medication Instructions:  RESTART Aspirin 81mg  daily, Atorvastatin 40mg  daily, Losartan 50mg  daily *If you need a refill on your cardiac medications before your next appointment, please call your pharmacy*  Lab Work: BMET in 2-3 weeks Lipids, ALT, BMET in 3 months If you have labs (blood work) drawn today and your tests are completely normal, you will receive your results only by: MyChart Message (if you have MyChart) OR A paper copy in the mail If you have any lab test that is abnormal or we need to change your treatment, we will call you to review the results.  Follow-Up: At [facility], you and your health needs are our priority.  As part of our continuing mission to provide you with exceptional heart care, we have created designated Provider Care Teams.  These Care Teams include your primary Cardiologist (physician) and Advanced Practice Providers (APPs -  Physician Assistants and Nurse Practitioners) who all work together to provide you with the care you need, when you need it.  Your next appointment:   3 month(s)  Provider:   Kristeen Miss, MD

## 2023-04-18 LAB — BASIC METABOLIC PANEL
BUN/Creatinine Ratio: 14 (ref 9–20)
BUN: 14 mg/dL (ref 6–24)
CO2: 25 mmol/L (ref 20–29)
Calcium: 9.6 mg/dL (ref 8.7–10.2)
Chloride: 105 mmol/L (ref 96–106)
Creatinine, Ser: 0.98 mg/dL (ref 0.76–1.27)
Glucose: 103 mg/dL — ABNORMAL HIGH (ref 70–99)
Potassium: 4.1 mmol/L (ref 3.5–5.2)
Sodium: 144 mmol/L (ref 134–144)
eGFR: 91 mL/min/{1.73_m2} (ref >59)

## 2023-05-10 ENCOUNTER — Telehealth: Payer: Self-pay | Admitting: Cardiovascular Disease

## 2023-05-10 NOTE — Telephone Encounter (Signed)
BMP is stable . Continue current medications  Written by Vesta Mixer, MD on 04/19/2023  9:45 AM EST   Returned call to patient at number provided. No answer and voicemail box full.

## 2023-05-10 NOTE — Telephone Encounter (Signed)
Patient called to follow-up on lab results.  Patient corrected his phone number to read - [phone].

## 2023-07-03 DIAGNOSIS — I10 Essential (primary) hypertension: Secondary | ICD-10-CM | POA: Insufficient documentation

## 2023-07-03 NOTE — Progress Notes (Signed)
 Cardiology Office Note:    Date:  07/04/2023  ID:  Zachary Cuevas, DOB 05/22/1966, MRN [id number] PCP: Duanne Butler DASEN, MD  [facility] Providers Cardiologist:  Aleene Passe, MD       Patient Profile:      Coronary artery disease  NSTEMI 06/2016 s/p CABG (L-LAD, R-RCA) Ischemic CM LV gram 06/2016: EF 35-40 IntraOp TEE 06/2016: EF 45-50, ant-sept HK, apical, inf HK Hypertension  Cough 2/2 ACEi Hyperlipidemia  FHx CAD          Zachary Cuevas is a 58 y.o. male who returns for follow up of CAD. He is a prior pt of Dr. Dann. He saw Dr. Passe 04/03/23. He had lost his insurance and was off all meds. He was started back on ASA, Atorvastatin .  Discussed the use of AI scribe software for clinical note transcription with the patient, who gave verbal consent to proceed.  History of Present Illness   He is here alone. Since last seen, he has not had chest pain, pressure, or shortness of breath beyond his usual level. He experiences occasional headaches, which he has been prone to most of his life, but denies dizziness, syncope, or gastrointestinal bleeding. He occasionally checks his blood pressure at home but has not done so recently. 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 He smokes cigars occasionally but does not smoke cigarettes. He has reduced his intake of processed foods and salt, although he still consumes [employer], albeit less than before.      ROS-See HPI    Studies Reviewed:              Risk Assessment/Calculations:     HYPERTENSION CONTROL Vitals:   07/04/23 0900 07/04/23 0930  BP: (!) 130/90 (!) 140/98    The patient's blood pressure is elevated above target today.  In order to address the patient's elevated BP: A new medication was prescribed today.          Physical Exam:   VS:  BP (!) 140/98   Pulse 90   Ht 6' 1 (1.854 m)   Wt 178 lb 3.2 oz (80.8 kg)   SpO2 96%    BMI 23.51 kg/m    Wt Readings from Last 3 Encounters:  07/04/23 178 lb 3.2 oz (80.8 kg)  04/03/23 172 lb (78 kg)  07/02/19 180 lb 6.4 oz (81.8 kg)    Constitutional:      Appearance: Healthy appearance. Not in distress.  Neck:     Vascular: JVD normal.  Pulmonary:     Breath sounds: Normal breath sounds. No wheezing. No rales.  Cardiovascular:     Normal rate. Regular rhythm.     Murmurs: There is no murmur.  Edema:    Peripheral edema absent.  Abdominal:     Palpations: Abdomen is soft.       Assessment and Plan:   Assessment & Plan Coronary artery disease involving native coronary artery of native heart without angina pectoris History of non-STEMI in 2018 followed by CABG.  He is doing well without chest discomfort to suggest angina.   -Continue ASA 81 mg daily, Lipitor  40 mg daily. Essential hypertension Blood pressure uncontrolled.  He was previously on metoprolol  tartrate in the past.  He tolerated this well. -Continue losartan  50 mg daily -Start carvedilol  6.25 mg twice daily -He has a BMET pending Pure hypercholesterolemia Continue Lipitor  40 mg daily. -Return for fasting lipids, ALT (pending from  last visit)       Dispo:  Return in about 3 months (around 10/01/2023) for Routine Follow Up, w/ Glendia Ferrier, PA-C.  Signed, Glendia Ferrier, PA-C

## 2023-07-04 ENCOUNTER — Other Ambulatory Visit: Payer: Self-pay | Admitting: *Deleted

## 2023-07-04 ENCOUNTER — Encounter: Payer: Self-pay | Admitting: Physician Assistant

## 2023-07-04 ENCOUNTER — Ambulatory Visit: Payer: Self-pay | Attending: Physician Assistant | Admitting: Physician Assistant

## 2023-07-04 VITALS — BP 140/98 | HR 90 | Ht 73.0 in | Wt 178.2 lb

## 2023-07-04 DIAGNOSIS — I2510 Atherosclerotic heart disease of native coronary artery without angina pectoris: Secondary | ICD-10-CM

## 2023-07-04 DIAGNOSIS — E7800 Pure hypercholesterolemia, unspecified: Secondary | ICD-10-CM

## 2023-07-04 DIAGNOSIS — I10 Essential (primary) hypertension: Secondary | ICD-10-CM

## 2023-07-04 MED ORDER — CARVEDILOL 6.25 MG PO TABS: 6.2500 mg | ORAL_TABLET | Freq: Two times a day (BID) | ORAL | 3 refills | Status: DC

## 2023-07-04 NOTE — Assessment & Plan Note (Signed)
Continue Lipitor 40 mg daily. -Return for fasting lipids, ALT (pending from last visit)

## 2023-07-04 NOTE — Patient Instructions (Addendum)
 Medication Instructions:  Your physician has recommended you make the following change in your medication:   START Coreg  6.25 taking 1 twice a day *If you need a refill on your cardiac medications before your next appointment, please call your pharmacy*   Lab Work: GO DOWNSTAIRS TO [facility], 1ST FLOOR, ONE DAY THIS WEEK, MAKE SURE YOU ARE FASTING,  FOR : LIPID, BMET, & ALT  If you have labs (blood work) drawn today and your tests are completely normal, you will receive your results only by: MyChart Message (if you have MyChart) OR A paper copy in the mail If you have any lab test that is abnormal or we need to change your treatment, we will call you to review the results.   Testing/Procedures: None ordered   Follow-Up: At [facility], you and your health needs are our priority.  As part of our continuing mission to provide you with exceptional heart care, we have created designated Provider Care Teams.  These Care Teams include your primary Cardiologist (physician) and Advanced Practice Providers (APPs -  Physician Assistants and Nurse Practitioners) who all work together to provide you with the care you need, when you need it.  We recommend signing up for the patient portal called MyChart.  Sign up information is provided on this After Visit Summary.  MyChart is used to connect with patients for Virtual Visits (Telemedicine).  Patients are able to view lab/test results, encounter notes, upcoming appointments, etc.  Non-urgent messages can be sent to your provider as well.   To learn more about what you can do with MyChart, go to [email or web address].    Your next appointment:   6 month(s)  Provider:   Aleene Passe, MD  or Glendia Ferrier, PA-C         Other Instructions   1st Floor: - Lobby - Registration  - Pharmacy  - Lab - Cafe  2nd Floor: - PV Lab - Diagnostic Testing (echo, CT, nuclear med)  3rd Floor: - Vacant  4th Floor: - TCTS (cardiothoracic  surgery) - AFib Clinic - Structural Heart Clinic - Vascular Surgery  - Vascular Ultrasound  5th Floor: - HeartCare Cardiology (general and EP) - Clinical Pharmacy for coumadin, hypertension, lipid, weight-loss medications, and med management appointments    Valet parking services will be available as well.

## 2023-07-04 NOTE — Assessment & Plan Note (Signed)
History of non-STEMI in 2018 followed by CABG.  He is doing well without chest discomfort to suggest angina.   -Continue ASA 81 mg daily, Lipitor 40 mg daily.

## 2023-07-04 NOTE — Assessment & Plan Note (Signed)
Blood pressure uncontrolled.  He was previously on metoprolol tartrate in the past.  He tolerated this well. -Continue losartan 50 mg daily -Start carvedilol 6.25 mg twice daily -He has a BMET pending

## 2023-10-01 NOTE — Progress Notes (Deleted)
   Cardiology Office Note:    Date:  10/01/2023  ID:  Zachary Cuevas, DOB 02-Jul-1965, MRN [id number] PCP: Austine Lefort, MD  [facility] Providers Cardiologist:  Ahmad Alert, MD { Click to update primary MD,subspecialty MD or APP then REFRESH:1}    {Click to Open Review  :1}   Patient Profile:     *** Coronary artery disease  NSTEMI 06/2016 s/p CABG (L-LAD, R-RCA) Ischemic CM LV gram 06/2016: EF 35-40 IntraOp TEE 06/2016: EF 45-50, ant-sept HK, apical, inf HK Hypertension  Cough 2/2 ACEi Hyperlipidemia  FHx CAD          Discussed the use of AI scribe software for clinical note transcription with the patient, who gave verbal consent to proceed.  History of Present Illness Zachary Cuevas is a 58 y.o. male who returns for follow up of CAD, HTN. He was last seen in 07/2023. Carvedilol  was added to his medical regimen.    ROS-See HPI***    Studies Reviewed:       *** Results    Risk Assessment/Calculations:   {Does this patient have ATRIAL FIBRILLATION?[phone]} No BP recorded.  {Refresh Note OR Click here to enter BP  :1}***       Physical Exam:   VS:  There were no vitals taken for this visit.   Wt Readings from Last 3 Encounters:  07/04/23 178 lb 3.2 oz (80.8 kg)  04/03/23 172 lb (78 kg)  07/02/19 180 lb 6.4 oz (81.8 kg)    Physical Exam***     Assessment and Plan:   Assessment & Plan Coronary artery disease involving native coronary artery of native heart without angina pectoris  Essential hypertension  Pure hypercholesterolemia  Assessment and Plan Assessment & Plan    { Coronary artery disease involving native coronary artery of native heart without angina pectoris History of non-STEMI in 2018 followed by CABG.  He is doing well without chest discomfort to suggest angina.   -Continue ASA 81 mg daily, Lipitor  40 mg daily. Essential hypertension Blood pressure uncontrolled.  He was previously on metoprolol  tartrate in the past.  He  tolerated this well. -Continue losartan  50 mg daily -Start carvedilol  6.25 mg twice daily -He has a BMET pending Pure hypercholesterolemia Continue Lipitor  40 mg daily. -Return for fasting lipids, ALT (pending from last visit)     :1}    {Are you ordering a CV Procedure (e.g. stress test, cath, DCCV, TEE, etc)?   Press F2        :[id number]}  Dispo:  No follow-ups on file.  Signed, Marlyse Single, PA-C

## 2023-10-02 ENCOUNTER — Ambulatory Visit: Payer: Self-pay | Attending: Physician Assistant | Admitting: Physician Assistant

## 2023-10-02 DIAGNOSIS — I2510 Atherosclerotic heart disease of native coronary artery without angina pectoris: Secondary | ICD-10-CM

## 2023-10-02 DIAGNOSIS — I10 Essential (primary) hypertension: Secondary | ICD-10-CM

## 2023-10-02 DIAGNOSIS — E7800 Pure hypercholesterolemia, unspecified: Secondary | ICD-10-CM

## 2023-10-03 ENCOUNTER — Encounter: Payer: Self-pay | Admitting: Physician Assistant

## 2024-02-09 ENCOUNTER — Telehealth: Payer: Self-pay | Admitting: Cardiovascular Disease

## 2024-02-09 DIAGNOSIS — E782 Mixed hyperlipidemia: Secondary | ICD-10-CM

## 2024-02-09 DIAGNOSIS — I10 Essential (primary) hypertension: Secondary | ICD-10-CM

## 2024-02-09 DIAGNOSIS — I2510 Atherosclerotic heart disease of native coronary artery without angina pectoris: Secondary | ICD-10-CM

## 2024-02-09 MED ORDER — LOSARTAN POTASSIUM 50 MG PO TABS: 50.0000 mg | ORAL_TABLET | Freq: Every day | ORAL | 1 refills | Status: DC

## 2024-02-09 NOTE — Telephone Encounter (Signed)
 Pt's medication was sent to pt's pharmacy as requested. Confirmation received.

## 2024-02-09 NOTE — Telephone Encounter (Signed)
*  STAT* If patient is at the pharmacy, call can be transferred to refill team.   1. Which medications need to be refilled? (please list name of each medication and dose if known)   losartan  (COZAAR ) 50 MG tablet   2. Would you like to learn more about the convenience, safety, & potential cost savings by using the [facility]?   3. Are you open to using the [facility] (Type [facility]. ).  4. Which pharmacy/location (including street and city if local pharmacy) is medication to be sent to?  [id number] [address] [place], [place] - [address]   5. Do they need a 30 day or 90 day supply?  90 day  Patient stated he is completely out of this medication.  Patient has appointment scheduled with CANDIE Ferrier, PA on 11/5.

## 2024-02-22 ENCOUNTER — Emergency Department (HOSPITAL_COMMUNITY): Payer: Self-pay

## 2024-02-22 ENCOUNTER — Observation Stay (HOSPITAL_COMMUNITY): Payer: Self-pay

## 2024-02-22 ENCOUNTER — Encounter (HOSPITAL_COMMUNITY): Payer: Self-pay | Admitting: Emergency Medicine

## 2024-02-22 ENCOUNTER — Observation Stay (HOSPITAL_BASED_OUTPATIENT_CLINIC_OR_DEPARTMENT_OTHER): Payer: Self-pay

## 2024-02-22 ENCOUNTER — Observation Stay (HOSPITAL_COMMUNITY)
Admission: EM | Admit: 2024-02-22 | Discharge: 2024-02-23 | Disposition: A | Discharge status: Home / Self Care | Payer: Self-pay | Attending: Infectious Diseases | Admitting: Infectious Diseases

## 2024-02-22 ENCOUNTER — Other Ambulatory Visit: Payer: Self-pay

## 2024-02-22 DIAGNOSIS — R29898 Other symptoms and signs involving the musculoskeletal system: Secondary | ICD-10-CM | POA: Insufficient documentation

## 2024-02-22 DIAGNOSIS — I6350 Cerebral infarction due to unspecified occlusion or stenosis of unspecified cerebral artery: Secondary | ICD-10-CM

## 2024-02-22 DIAGNOSIS — I639 Cerebral infarction, unspecified: Principal | ICD-10-CM | POA: Diagnosis present

## 2024-02-22 DIAGNOSIS — I6389 Other cerebral infarction: Secondary | ICD-10-CM

## 2024-02-22 DIAGNOSIS — Z79899 Other long term (current) drug therapy: Secondary | ICD-10-CM | POA: Insufficient documentation

## 2024-02-22 DIAGNOSIS — G8194 Hemiplegia, unspecified affecting left nondominant side: Secondary | ICD-10-CM | POA: Insufficient documentation

## 2024-02-22 DIAGNOSIS — I5022 Chronic systolic (congestive) heart failure: Secondary | ICD-10-CM

## 2024-02-22 DIAGNOSIS — I10 Essential (primary) hypertension: Secondary | ICD-10-CM | POA: Insufficient documentation

## 2024-02-22 DIAGNOSIS — R29702 NIHSS score 2: Secondary | ICD-10-CM

## 2024-02-22 DIAGNOSIS — F1290 Cannabis use, unspecified, uncomplicated: Secondary | ICD-10-CM

## 2024-02-22 DIAGNOSIS — Z7982 Long term (current) use of aspirin: Secondary | ICD-10-CM | POA: Insufficient documentation

## 2024-02-22 DIAGNOSIS — E785 Hyperlipidemia, unspecified: Secondary | ICD-10-CM | POA: Insufficient documentation

## 2024-02-22 DIAGNOSIS — Z7902 Long term (current) use of antithrombotics/antiplatelets: Secondary | ICD-10-CM | POA: Insufficient documentation

## 2024-02-22 DIAGNOSIS — I429 Cardiomyopathy, unspecified: Secondary | ICD-10-CM | POA: Insufficient documentation

## 2024-02-22 DIAGNOSIS — Z8249 Family history of ischemic heart disease and other diseases of the circulatory system: Secondary | ICD-10-CM

## 2024-02-22 DIAGNOSIS — Z951 Presence of aortocoronary bypass graft: Secondary | ICD-10-CM | POA: Insufficient documentation

## 2024-02-22 DIAGNOSIS — J36 Peritonsillar abscess: Secondary | ICD-10-CM | POA: Insufficient documentation

## 2024-02-22 DIAGNOSIS — F17210 Nicotine dependence, cigarettes, uncomplicated: Secondary | ICD-10-CM | POA: Insufficient documentation

## 2024-02-22 DIAGNOSIS — F17290 Nicotine dependence, other tobacco product, uncomplicated: Secondary | ICD-10-CM

## 2024-02-22 DIAGNOSIS — I739 Peripheral vascular disease, unspecified: Secondary | ICD-10-CM

## 2024-02-22 DIAGNOSIS — I2510 Atherosclerotic heart disease of native coronary artery without angina pectoris: Secondary | ICD-10-CM | POA: Insufficient documentation

## 2024-02-22 HISTORY — DX: Cerebral infarction, unspecified: I63.9

## 2024-02-22 LAB — URINALYSIS, ROUTINE W REFLEX MICROSCOPIC
Bilirubin Urine: NEGATIVE
Glucose, UA: NEGATIVE mg/dL
Hgb urine dipstick: NEGATIVE
Ketones, ur: NEGATIVE mg/dL
Leukocytes,Ua: NEGATIVE
Nitrite: NEGATIVE
Protein, ur: NEGATIVE mg/dL
Specific Gravity, Urine: 1.002 — ABNORMAL LOW (ref 1.005–1.030)
pH: 5 (ref 5.0–8.0)

## 2024-02-22 LAB — COMPREHENSIVE METABOLIC PANEL WITH GFR
ALT: 16 U/L (ref 0–44)
AST: 21 U/L (ref 15–41)
Albumin: 4 g/dL (ref 3.5–5.0)
Alkaline Phosphatase: 121 U/L (ref 38–126)
Anion gap: 8 (ref 5–15)
BUN: 17 mg/dL (ref 6–20)
CO2: 28 mmol/L (ref 22–32)
Calcium: 9.4 mg/dL (ref 8.9–10.3)
Chloride: 103 mmol/L (ref 98–111)
Creatinine, Ser: 0.98 mg/dL (ref 0.61–1.24)
GFR, Estimated: >60 mL/min (ref >60)
Glucose, Bld: 107 mg/dL — ABNORMAL HIGH (ref 70–99)
Potassium: 4.1 mmol/L (ref 3.5–5.1)
Sodium: 139 mmol/L (ref 135–145)
Total Bilirubin: 0.9 mg/dL (ref 0.0–1.2)
Total Protein: 7.3 g/dL (ref 6.5–8.1)

## 2024-02-22 LAB — I-STAT CHEM 8, ED
BUN: 18 mg/dL (ref 6–20)
Calcium, Ion: 1.3 mmol/L (ref 1.15–1.40)
Chloride: 104 mmol/L (ref 98–111)
Creatinine, Ser: 1 mg/dL (ref 0.61–1.24)
Glucose, Bld: 106 mg/dL — ABNORMAL HIGH (ref 70–99)
HCT: 44 % (ref 39.0–52.0)
Hemoglobin: 15 g/dL (ref 13.0–17.0)
Potassium: 4.1 mmol/L (ref 3.5–5.1)
Sodium: 141 mmol/L (ref 135–145)
TCO2: 27 mmol/L (ref 22–32)

## 2024-02-22 LAB — CBC
HCT: 45.6 % (ref 39.0–52.0)
Hemoglobin: 14.9 g/dL (ref 13.0–17.0)
MCH: 27.2 pg (ref 26.0–34.0)
MCHC: 32.7 g/dL (ref 30.0–36.0)
MCV: 83.2 fL (ref 80.0–100.0)
Platelets: 234 K/uL (ref 150–400)
RBC: 5.48 MIL/uL (ref 4.22–5.81)
RDW: 12.6 % (ref 11.5–15.5)
WBC: 9.1 K/uL (ref 4.0–10.5)
nRBC: 0 % (ref 0.0–0.2)

## 2024-02-22 LAB — LIPID PANEL
Cholesterol: 144 mg/dL (ref 0–200)
HDL: 29 mg/dL — ABNORMAL LOW (ref >40)
LDL Cholesterol: 81 mg/dL (ref 0–99)
Total CHOL/HDL Ratio: 5 ratio
Triglycerides: 172 mg/dL — ABNORMAL HIGH (ref <150)
VLDL: 34 mg/dL (ref 0–40)

## 2024-02-22 LAB — DIFFERENTIAL
Abs Immature Granulocytes: 0.02 K/uL (ref 0.00–0.07)
Basophils Absolute: 0.1 K/uL (ref 0.0–0.1)
Basophils Relative: 1 %
Eosinophils Absolute: 0.4 K/uL (ref 0.0–0.5)
Eosinophils Relative: 4 %
Immature Granulocytes: 0 %
Lymphocytes Relative: 28 %
Lymphs Abs: 2.6 K/uL (ref 0.7–4.0)
Monocytes Absolute: 0.5 K/uL (ref 0.1–1.0)
Monocytes Relative: 5 %
Neutro Abs: 5.5 K/uL (ref 1.7–7.7)
Neutrophils Relative %: 62 %

## 2024-02-22 LAB — APTT: aPTT: 38 s — ABNORMAL HIGH (ref 24–36)

## 2024-02-22 LAB — PROTIME-INR
INR: 1 (ref 0.8–1.2)
Prothrombin Time: 13.3 s (ref 11.4–15.2)

## 2024-02-22 LAB — ECHOCARDIOGRAM COMPLETE BUBBLE STUDY
Area-P 1/2: 3.03 cm2
Calc EF: 54 %
Est EF: 50
S' Lateral: 3.4 cm
Single Plane A2C EF: 55.3 %
Single Plane A4C EF: 51.9 %

## 2024-02-22 LAB — HEMOGLOBIN A1C
Hgb A1c MFr Bld: 5.8 % — ABNORMAL HIGH (ref 4.8–5.6)
Mean Plasma Glucose: 119.76 mg/dL

## 2024-02-22 LAB — HIV ANTIBODY (ROUTINE TESTING W REFLEX): HIV Screen 4th Generation wRfx: NONREACTIVE

## 2024-02-22 LAB — ETHANOL: Alcohol, Ethyl (B): <15 mg/dL (ref <15)

## 2024-02-22 MED ORDER — ATORVASTATIN CALCIUM 40 MG PO TABS
40.0000 mg | ORAL_TABLET | Freq: Every day | ORAL | Status: DC
Start: 2024-02-22 — End: 2024-02-23
  Administered 2024-02-22: 40 mg via ORAL
  Filled 2024-02-22: qty 1

## 2024-02-22 MED ORDER — IOHEXOL 350 MG/ML SOLN
75.0000 mL | Freq: Once | INTRAVENOUS | Status: AC | PRN
  Administered 2024-02-22: 75 mL via INTRAVENOUS

## 2024-02-22 MED ORDER — ACETAMINOPHEN 325 MG PO TABS
650.0000 mg | ORAL_TABLET | Freq: Four times a day (QID) | ORAL | Status: DC | PRN
  Administered 2024-02-22: 650 mg via ORAL
  Filled 2024-02-22: qty 2

## 2024-02-22 MED ORDER — CLOPIDOGREL BISULFATE 75 MG PO TABS
75.0000 mg | ORAL_TABLET | Freq: Every day | ORAL | Status: DC
  Administered 2024-02-23: 75 mg via ORAL
  Filled 2024-02-22: qty 1

## 2024-02-22 MED ORDER — ENOXAPARIN SODIUM 40 MG/0.4ML IJ SOSY
40.0000 mg | PREFILLED_SYRINGE | INTRAMUSCULAR | Status: DC
  Administered 2024-02-22: 40 mg via SUBCUTANEOUS
  Filled 2024-02-22: qty 0.4

## 2024-02-22 MED ORDER — ASPIRIN 81 MG PO TBEC
81.0000 mg | DELAYED_RELEASE_TABLET | Freq: Every day | ORAL | Status: DC
  Administered 2024-02-22 – 2024-02-23 (×2): 81 mg via ORAL
  Filled 2024-02-22 (×2): qty 1

## 2024-02-22 MED ORDER — ROSUVASTATIN CALCIUM 20 MG PO TABS: 20.0000 mg | ORAL_TABLET | Freq: Every day | ORAL | Status: DC

## 2024-02-22 MED ORDER — CLOPIDOGREL BISULFATE 300 MG PO TABS
300.0000 mg | ORAL_TABLET | Freq: Once | ORAL | Status: AC
  Administered 2024-02-22: 300 mg via ORAL
  Filled 2024-02-22: qty 1

## 2024-02-22 NOTE — H&P (Signed)
 Date: 02/22/2024         Patient Name:  Zachary Cuevas MRN: [id number]  DOB: [birth year] Age / Sex: 58 y.o., male   PCP: Duanne Butler DASEN, MD         Medical Service: Internal Medicine Teaching Service         Attending Physician: Dr. Eben Reyes BROCKS, MD    First Contact: Dr. Napoleon Pager: [phone]  Second Contact: Dr. Marylu Pager: [phone]                 Chief Concern: Left leg weakness  History of Present Illness: 58 year old presents to ED, driven by family, for left leg weakness leading to stumbling and difficulty walking. Symptoms started around 8 p.m. day prior. Before that he was well, felt normal. First noticed leg heaviness that he attributed to sitting on his leg, like it was asleep. When he woke up the sensation was worse and he stumbled walking up the stairs which is unusual for him. Notes some imbalance, no head-spinning. No associated headache. Notes some blurry vision over the last 3 weeks that he attributes to his prescription glasses.  History notable for heart attack in 2018, underwent bypass surgery. Chart review shows NSTEMI and graft x 2 to LAD and distal right coronary. Echo around that time showed mild dilation of LV with mildly reduced ejection fraction with PFO closure device present. He has hypertension that is poorly controlled despite medication. From chart he's on losartan  and carvedilol  and he reports good adherence, fill history supports this.  Family history notable for death due to heart attack in mother at 33, heart attack in father at 4, heart attack in brother at 68.  He lives locally, works in [employer]. Used to smoke but quit prior to MI in 2018. He is daily marijuana user via smoked route. Non-drinker. Doesn't use other drugs.  ED course notable for hypertension, MRI that showed 16 mm acute infarct in right corona radiata and sub-centimeter acute infarct within right lentiform nucleus.  ROS per above.   Allergies: Allergies   Allergen Reactions   Penicillins Other (See Comments)    Childhood allergy - does not remember exact reaction    Past Medical History: Patient Active Problem List   Diagnosis Date Noted   Acute cerebral infarction (HCC) 02/22/2024   Essential hypertension 07/03/2023   Hyperlipidemia 09/28/2016   Cardiomyopathy (HCC) 09/28/2016   S/P CABG x 2    Coronary artery disease 07/14/2016   Non-STEMI (non-ST elevated myocardial infarction) (HCC) 07/13/2016   Allergy    Past Medical History:  Diagnosis Date   Allergy    hay fever   Hypercholesteremia    Hypertension     Medications: No current facility-administered medications on file prior to encounter.   Current Outpatient Medications on File Prior to Encounter  Medication Sig Dispense Refill   acetaminophen  (TYLENOL ) 325 MG tablet Take 2 tablets (650 mg total) by mouth every 6 (six) hours as needed for mild pain.     aspirin  EC 81 MG tablet Take 1 tablet (81 mg total) by mouth daily.     Aspirin -Acetaminophen -Caffeine (GOODYS EXTRA STRENGTH PO) Take 1 Package by mouth 2 (two) times daily as needed (Pain).     atorvastatin  (LIPITOR ) 40 MG tablet Take 1 tablet (40 mg total) by mouth daily. 90 tablet 3   carvedilol  (COREG ) 6.25 MG tablet Take 1 tablet (6.25 mg total) by mouth 2 (two) times daily. 180 tablet 3  losartan  (COZAAR ) 50 MG tablet Take 1 tablet (50 mg total) by mouth daily. 90 tablet 1     Surgical History: Past Surgical History:  Procedure Laterality Date   CORONARY ARTERY BYPASS GRAFT N/A 07/14/2016   Procedure: CORONARY ARTERY BYPASS GRAFTING (CABG) x 2 WITH BILATERAL IMA;  Surgeon: Elspeth JAYSON Millers, MD;  Location: [facility] OR;  Service: Open Heart Surgery;  Laterality: N/A;   CORONARY BALLOON ANGIOPLASTY N/A 07/13/2016   Procedure: Coronary Balloon Angioplasty;  Surgeon: Deatrice DELENA Cage, MD;  Location: [facility] INV[facility] LAB;  Service: Cardiovascular;  Laterality: N/A;   FRACTURE SURGERY  2005   boxer fracture  of hands    LEFT HEART CATH AND CORONARY ANGIOGRAPHY N/A 07/13/2016   Procedure: Left Heart Cath and Coronary Angiography;  Surgeon: Deatrice DELENA Cage, MD;  Location: [facility] INVAS[facility] LAB;  Service: Cardiovascular;  Laterality: N/A;   TEE WITHOUT CARDIOVERSION N/A 07/14/2016   Procedure: TRANSESOPHAGEAL ECHOCARDIOGRAM (TEE);  Surgeon: Elspeth JAYSON Millers, MD;  Location: [facility] OR;  Service: Open Heart Surgery;  Laterality: N/A;    Family History:  Family History  Problem Relation Age of Onset   Miscarriages / Stillbirths Mother    CAD Mother 28   Heart disease Maternal Grandfather    Diabetes Paternal Grandfather    Heart disease Paternal Grandfather    Heart disease Father 47   Diabetes Brother     Social History:  Social History   Socioeconomic History   Marital status: Married    Spouse name: Not on file   Number of children: Not on file   Years of education: Not on file   Highest education level: Not on file  Occupational History   Not on file  Tobacco Use   Smoking status: Former    Current packs/day: 0.00    Types: Cigarettes    Quit date: 08/11/2011    Years since quitting: 12.5   Smokeless tobacco: Never  Vaping Use   Vaping status: Former  Substance and Sexual Activity   Alcohol use: No   Drug use: Yes    Frequency: 7.0 times per week    Types: Marijuana   Sexual activity: Yes    Birth control/protection: Surgical    Comment: wife had tubal ligation  Other Topics Concern   Not on file  Social History Narrative   Not on file   Social Drivers of Health   Financial Resource Strain: Not on file  Food Insecurity: Not on file  Transportation Needs: Not on file  Physical Activity: Not on file  Stress: Not on file  Social Connections: Not on file  Intimate Partner Violence: Not on file      Physical Exam: Blood pressure (!) 171/105, pulse 64, temperature 97.9 F (36.6 C), temperature source Oral, resp. rate 18, height 6' 1 (1.854 m), weight 80.8 kg, SpO2 98%.  No  distress Moist pink oral mucosa Heart rate and rhythm normal, radial pulse is strong, no appreciable murmur Breathing normally, lungs clear anteriorly Skin warm and dry Alert and oriented, no facial asymmetry, tongue and palate midline, eye movements normal, pupils normal, no nystagmus, no sensory neglect, speech sounds normal, no pronator drift, 4+/5 strength L hip flexion, strength otherwise normal  EKG:  Sinus rhythm 52 bpm w/out ST segment changes from prior November 2024  Labs: Reviewed, largely unremarkable.  Images and other studies: Per above. MRI also shows chronic-appearing microvascular cerebral changes that are advanced for age.  Assessment & Plan:  Bravery Ketcham is  a 58 y.o. with chronic atherosclerotic cardiovascular disease who presents with left leg weakness and is admitted for acute cerebral infarct.  Principal Problem:   Acute cerebral infarction (HCC) Clinically stable. Perhaps a lacunar type infarct given his history and severe hypertension. His deficits are subtle but may impact his ability to work. Neurology to see. Permissive hypertension for now. Check lipids and A1c. Check echo. Swallow screen, then start diet if safe. Start high-intensity statin. Anticipate addition of antiplatelets. Small infarct, will start VTE prophylaxis.  Active Problems:   S/P CABG x 2   Hyperlipidemia NSTEMI in 2018. No recent chest pain or dyspnea. Clinically stable, statin and antiplatelets per above.    Cardiomyopathy (HCC) Chronic, well-compensated. With LV dilation and mildly reduced EF on 2018 echo around time of CABG. 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 Echo to follow, primarily for stroke workup.    Essential hypertension Chronic, poorly controlled despite losartan  and carvedilol  outside of the hospital. Hypertensive but asymptomatic at present. Resume antihypertensives tomorrow.    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 PT to start as soon as possible.  Level of care: med-tele Diet: regular after he passes swallow screen VTE: enoxaparin  (LOVENOX ) injection 40 mg Start: 02/22/24 1330 Code: full Surrogate: spouse, Misty Gitto  Signed: Ozell Kung MD 02/22/2024, 1:46 PM Pager: [phone]

## 2024-02-22 NOTE — Hospital Course (Signed)
 Stroke Ll weakness Htn S/p cabg?

## 2024-02-22 NOTE — ED Triage Notes (Signed)
 PT presents POV from home with complaints of left sided weakness that started last around 9pm. Denies pain. States that left arm and left leg feel weaker than right. Feeling is similar to what it feels like when foot falls asleep. Pt denies blurred vision, denies headache, denies falls. Was ambulatory with cane on arrival.

## 2024-02-22 NOTE — ED Notes (Signed)
 Patient transported to CT

## 2024-02-22 NOTE — ED Notes (Signed)
 CCMD called, pt on monitor

## 2024-02-22 NOTE — Consult Note (Signed)
 NEUROLOGY CONSULT NOTE   Date of service: February 22, 2024 Patient Name: Zachary Cuevas MRN:  [id number] DOB:  [birth year] Chief Complaint: dizziness Requesting Provider: Eben Reyes BROCKS, MD  History of Present Illness  Zachary Tumolo is a 58 y.o. male with hx of htn hypercholesterolemia he started noticing that he was unsteady around eight 30-9 o'clock last night.  He states that he uses the word dizzy, but he thinks that he chose the wrong word and really meant more of being off balance.  Due to the symptoms continuing, he sought care in the emergency department where an MRI was performed showing two small subcortical strokes on the right  LKW: 8 PM last night Modified rankin score: 0-Completely asymptomatic and back to baseline post- stroke IV Thrombolysis: No, outside of window EVT: No, no LVO   NIHSS components Score: Comment  1a Level of Conscious 0[x]  1[]  2[]  3[]      1b LOC Questions 0[x]  1[]  2[]       1c LOC Commands 0[x]  1[]  2[]       2 Best Gaze 0[x]  1[]  2[]       3 Visual 0[x]  1[]  2[]  3[]      4 Facial Palsy 0[x]  1[]  2[]  3[]      5a Motor Arm - left 0[]  1[x]  2[]  3[]  4[]  UN[]    5b Motor Arm - Right 0[x]  1[]  2[]  3[]  4[]  UN[]    6a Motor Leg - Left 0[]  1[x]  2[]  3[]  4[]  UN[]    6b Motor Leg - Right 0[x]  1[]  2[]  3[]  4[]  UN[]    7 Limb Ataxia 0[x]  1[]  2[]  UN[]      8 Sensory 0[x]  1[]  2[]  UN[]      9 Best Language 0[x]  1[]  2[]  3[]      10 Dysarthria 0[x]  1[]  2[]  UN[]      11 Extinct. and Inattention 0[x]  1[]  2[]       TOTAL: 2      Past History   Past Medical History:  Diagnosis Date   Allergy    hay fever   Hypercholesteremia    Hypertension     Past Surgical History:  Procedure Laterality Date   CORONARY ARTERY BYPASS GRAFT N/A 07/14/2016   Procedure: CORONARY ARTERY BYPASS GRAFTING (CABG) x 2 WITH BILATERAL IMA;  Surgeon: Elspeth BROCKS Millers, MD;  Location: [facility] OR;  Service: Open Heart Surgery;  Laterality: N/A;   CORONARY BALLOON ANGIOPLASTY N/A 07/13/2016   Procedure:  Coronary Balloon Angioplasty;  Surgeon: Deatrice DELENA Cage, MD;  Location: [facility] LAB;  Service: Cardiovascular;  Laterality: N/A;   FRACTURE SURGERY  2005   boxer fracture  of hands   LEFT HEART CATH AND CORONARY ANGIOGRAPHY N/A 07/13/2016   Procedure: Left Heart Cath and Coronary Angiography;  Surgeon: Deatrice DELENA Cage, MD;  Location: [facility];  Service: Cardiovascular;  Laterality: N/A;   TEE WITHOUT CARDIOVERSION N/A 07/14/2016   Procedure: TRANSESOPHAGEAL ECHOCARDIOGRAM (TEE);  Surgeon: Elspeth BROCKS Millers, MD;  Location: [facility] OR;  Service: Open Heart Surgery;  Laterality: N/A;    Family History: Family History  Problem Relation Age of Onset   Miscarriages / Stillbirths Mother    CAD Mother 55   Heart disease Maternal Grandfather    Diabetes Paternal Grandfather    Heart disease Paternal Grandfather    Heart disease Father 54   Diabetes Brother     Social History  reports that he quit smoking about 12 years ago. His smoking use included cigarettes. He has never used smokeless tobacco. He reports current drug  use. Frequency: 7.00 times per week. Drug: Marijuana. He reports that he does not drink alcohol.  Allergies  Allergen Reactions   Penicillins Other (See Comments)    Childhood allergy - does not remember exact reaction    Medications   Current Facility-Administered Medications:    enoxaparin  (LOVENOX ) injection 40 mg, 40 mg, Subcutaneous, Q24H, McLendon, Michael, MD   rosuvastatin  (CRESTOR ) tablet 20 mg, 20 mg, Oral, Daily, McLendon, Michael, MD  Current Outpatient Medications:    acetaminophen  (TYLENOL ) 325 MG tablet, Take 2 tablets (650 mg total) by mouth every 6 (six) hours as needed for mild pain., Disp: , Rfl:    aspirin  EC 81 MG tablet, Take 1 tablet (81 mg total) by mouth daily., Disp: , Rfl:    Aspirin -Acetaminophen -Caffeine (GOODYS EXTRA STRENGTH PO), Take 1 Package by mouth 2 (two) times daily as needed (Pain)., Disp: , Rfl:    atorvastatin   (LIPITOR ) 40 MG tablet, Take 1 tablet (40 mg total) by mouth daily., Disp: 90 tablet, Rfl: 3   carvedilol  (COREG ) 6.25 MG tablet, Take 1 tablet (6.25 mg total) by mouth 2 (two) times daily., Disp: 180 tablet, Rfl: 3   losartan  (COZAAR ) 50 MG tablet, Take 1 tablet (50 mg total) by mouth daily., Disp: 90 tablet, Rfl: 1  Vitals   Vitals:   2024-03-09 0728 2024/03/09 0730 03/09/24 0800 2024/03/09 1153  BP: (!) 186/122 (!) 181/129 (!) 158/112 (!) 171/105  Pulse: 66 64 (!) 54 64  Resp: 18  17 18   Temp: 97.9 F (36.6 C)     TempSrc: Oral     SpO2: 100% 100% 100% 98%  Weight:      Height:        Body mass index is 23.5 kg/m.   Physical Exam   Constitutional: Appears well-developed and well-nourished.  Neurologic Examination    Neuro: Mental Status: Patient is awake, alert, oriented to person, place, month, year, and situation. Patient is able to give a clear and coherent history. No signs of aphasia or neglect Cranial Nerves: II: Visual Fields are full. Pupils are equal, round, and reactive to light.   III,IV, VI: EOMI without ptosis or diploplia.  V: Facial sensation is symmetric to temperature VII: Facial movement is symmetric.  VIII: hearing is intact to voice X: Uvula elevates symmetrically XII: tongue is midline without atrophy or fasciculations.  Motor: Tone is normal. Bulk is normal. 5/5 strength was present on the right, he has very mild 4+/5 weakness of the left arm and leg with mild drift in both Sensory: Sensation is symmetric to light touch and temperature in the arms and legs. Cerebellar: He has mild impairment on finger-nose-finger on the left consistent with weakness       Labs/Imaging/Neurodiagnostic studies   CBC:  Recent Labs  Lab 2024/03/09 0743 March 09, 2024 0748  WBC 9.1  --   NEUTROABS 5.5  --   HGB 14.9 15.0  HCT 45.6 44.0  MCV 83.2  --   PLT 234  --    Basic Metabolic Panel:  Lab Results  Component Value Date   NA 141 Mar 09, 2024   K 4.1  03-09-2024   CO2 28 03-09-24   GLUCOSE 106 (H) 09-Mar-2024   BUN 18 2024/03/09   CREATININE 1.00 March 09, 2024   CALCIUM  9.4 2024/03/09   GFRNONAA >60 2024/03/09   GFRAA 97 06/05/2017   Lipid Panel:  Lab Results  Component Value Date   LDLCALC 42 01/18/2017   HgbA1c:  Lab Results  Component Value Date  HGBA1C 5.7 (H) 07/13/2016   Urine Drug Screen: No results found for: LABOPIA, COCAINSCRNUR, LABBENZ, AMPHETMU, THCU, LABBARB  Alcohol Level     Component Value Date/Time   [facility] <15 02/22/2024 0743   INR  Lab Results  Component Value Date   INR 1.0 02/22/2024   APTT  Lab Results  Component Value Date   APTT 38 (H) 02/22/2024   MRI Brain(Personally reviewed): Two small subcortical strokes on the right  ASSESSMENT   Abdinasir Spadafore is a 58 y.o. male with two small subcortical strokes on the right.  The location makes me think that this is more likely concurrent small vessel disease, but with multiple, I would favor further workup with CTA, echo, telemetry.  I advised him to quit smoking cigars.  RECOMMENDATIONS  - HgbA1c, fasting lipid panel - Frequent neuro checks - Echocardiogram - CTA head and neck - Prophylactic therapy-Antiplatelet med: Aspirin  - dose 81mg  and plavix  75mg  daily  after 300mg  load  - Risk factor modification - Telemetry monitoring - PT consult, OT consult, Speech consult - Stroke team to follow  ______________________________________________________________________    Signed, Aisha Seals, MD [facility]

## 2024-02-22 NOTE — Plan of Care (Signed)

## 2024-02-22 NOTE — Consult Note (Incomplete)
 NEUROLOGY CONSULT NOTE   Date of service: February 22, 2024 Patient Name: Zachary Cuevas MRN:  [id number] DOB:  [birth year] Chief Complaint: *** Requesting Provider: Freddi Hamilton, MD  History of Present Illness  Zachary Cuevas is a 58 y.o. male with hx of hypertension, hypercholesterolemia who presents to ED with chief concern of left sided weakness and not feeling steady on his feet. Pt notes that around 8:30-9:00pm last night, he started to feel dizzy and unsteady on his feet. No prior inciting event noted, pt denies any head injury, loss of consciousness, or any changes in vision. He states that he woke up this morning and felt worse initially, but since arriving to the ED, his symptoms are comparable to initial onset. He says that his left leg feels as though it is asleep, without the pins and needles sensation, and that he is not numb because he can still feel sensation to touch.  LKW: *** Modified rankin score: {Modified Rankin Scale:21264} IV Thrombolysis: No EVT:   NIHSS components Score: Comment  1a Level of Conscious 0[]  1[]  2[]  3[]      1b LOC Questions 0[]  1[]  2[]       1c LOC Commands 0[]  1[]  2[]       2 Best Gaze 0[]  1[]  2[]       3 Visual 0[]  1[]  2[]  3[]      4 Facial Palsy 0[]  1[]  2[]  3[]      5a Motor Arm - left 0[]  1[]  2[]  3[]  4[]  UN[]    5b Motor Arm - Right 0[]  1[]  2[]  3[]  4[]  UN[]    6a Motor Leg - Left 0[]  1[]  2[]  3[]  4[]  UN[]    6b Motor Leg - Right 0[]  1[]  2[]  3[]  4[]  UN[]    7 Limb Ataxia 0[]  1[]  2[]  UN[]      8 Sensory 0[]  1[]  2[]  UN[]      9 Best Language 0[]  1[]  2[]  3[]      10 Dysarthria 0[]  1[]  2[]  UN[]      11 Extinct. and Inattention 0[]  1[]  2[]       TOTAL:       ROS  Comprehensive ROS performed and pertinent positives documented in HPI   Past History   Past Medical History:  Diagnosis Date   Allergy    hay fever   Hypercholesteremia    Hypertension     Past Surgical History:  Procedure Laterality Date   CORONARY ARTERY BYPASS GRAFT N/A  07/14/2016   Procedure: CORONARY ARTERY BYPASS GRAFTING (CABG) x 2 WITH BILATERAL IMA;  Surgeon: Elspeth JAYSON Millers, MD;  Location: [facility] OR;  Service: Open Heart Surgery;  Laterality: N/A;   CORONARY BALLOON ANGIOPLASTY N/A 07/13/2016   Procedure: Coronary Balloon Angioplasty;  Surgeon: Deatrice DELENA Cage, MD;  Location: [facility];  Service: Cardiovascular;  Laterality: N/A;   FRACTURE SURGERY  2005   boxer fracture  of hands   LEFT HEART CATH AND CORONARY ANGIOGRAPHY N/A 07/13/2016   Procedure: Left Heart Cath and Coronary Angiography;  Surgeon: Deatrice DELENA Cage, MD;  Location: [facility];  Service: Cardiovascular;  Laterality: N/A;   TEE WITHOUT CARDIOVERSION N/A 07/14/2016   Procedure: TRANSESOPHAGEAL ECHOCARDIOGRAM (TEE);  Surgeon: Elspeth JAYSON Millers, MD;  Location: [facility] OR;  Service: Open Heart Surgery;  Laterality: N/A;    Family History: Family History  Problem Relation Age of Onset   Miscarriages / Stillbirths Mother    CAD Mother 57   Heart disease Maternal Grandfather    Diabetes Paternal Grandfather    Heart disease Paternal  Grandfather    Heart disease Father 49   Diabetes Brother     Social History  reports that he quit smoking about 12 years ago. His smoking use included cigarettes. He has never used smokeless tobacco. He reports current drug use. Frequency: 7.00 times per week. Drug: Marijuana. He reports that he does not drink alcohol.  Allergies  Allergen Reactions   Penicillins Other (See Comments)    Childhood allergy - does not remember exact reaction    Medications  No current facility-administered medications for this encounter.  Current Outpatient Medications:    acetaminophen  (TYLENOL ) 325 MG tablet, Take 2 tablets (650 mg total) by mouth every 6 (six) hours as needed for mild pain., Disp: , Rfl:    aspirin  EC 81 MG tablet, Take 1 tablet (81 mg total) by mouth daily., Disp: , Rfl:    Aspirin -Acetaminophen -Caffeine (GOODYS EXTRA STRENGTH PO),  Take 1 Package by mouth 2 (two) times daily as needed (Pain)., Disp: , Rfl:    atorvastatin  (LIPITOR ) 40 MG tablet, Take 1 tablet (40 mg total) by mouth daily., Disp: 90 tablet, Rfl: 3   carvedilol  (COREG ) 6.25 MG tablet, Take 1 tablet (6.25 mg total) by mouth 2 (two) times daily., Disp: 180 tablet, Rfl: 3   losartan  (COZAAR ) 50 MG tablet, Take 1 tablet (50 mg total) by mouth daily., Disp: 90 tablet, Rfl: 1  Vitals   Vitals:   03/07/2024 0728 Mar 07, 2024 0730 2024-03-07 0800 03/07/2024 1153  BP: (!) 186/122 (!) 181/129 (!) 158/112 (!) 171/105  Pulse: 66 64 (!) 54 64  Resp: 18  17 18   Temp: 97.9 F (36.6 C)     TempSrc: Oral     SpO2: 100% 100% 100% 98%  Weight:      Height:        Body mass index is 23.5 kg/m.   Physical Exam   Constitutional: Appears well-developed and well-nourished.  Psych: Affect appropriate to situation. Eyes: No scleral injection.  HENT: No OP obstruction.  Head: Normocephalic. Atraumatic. Cardiovascular: Normal rate and regular rhythm.  Respiratory: Effort normal, non-labored breathing.  GI: Soft.  No distension. There is no tenderness.  Skin: WDI.   Neurologic Examination   Mental Status: Awake, alert and oriented x3, able to appropriately answer questions and follow commands. No aphasia, word finding, or dysarthria. Cranial Nerves: II: PERRL, no visual field deficits III, IV, VI: EOMI, no nystagmus, no gaze preference VII: Facial movement symmetric VIII: Hearing intact to speech IX, X: Uvula elevates symmetrically, phonation intact XI: XII: Tongue protrudes midline Motor/Sensory: Normal bulk and tone. Strength 5/5 intact bilaterally. Patient does not endorse any decreased sensation in lower extremities to light touch. Labs/Imaging/Neurodiagnostic studies   CBC:  Recent Labs  Lab 2024-03-07 0743 Mar 07, 2024 0748  WBC 9.1  --   NEUTROABS 5.5  --   HGB 14.9 15.0  HCT 45.6 44.0  MCV 83.2  --   PLT 234  --    Basic Metabolic Panel:  Lab Results   Component Value Date   NA 141 07-Mar-2024   K 4.1 03/07/2024   CO2 28 March 07, 2024   GLUCOSE 106 (H) 03-07-2024   BUN 18 2024/03/07   CREATININE 1.00 Mar 07, 2024   CALCIUM  9.4 03/07/2024   GFRNONAA >60 2024-03-07   GFRAA 97 06/05/2017   Lipid Panel:  Lab Results  Component Value Date   LDLCALC 42 01/18/2017   HgbA1c:  Lab Results  Component Value Date   HGBA1C 5.7 (H) 07/13/2016   Urine Drug Screen:  No results found for: LABOPIA, COCAINSCRNUR, LABBENZ, AMPHETMU, THCU, LABBARB  Alcohol Level     Component Value Date/Time   [facility] <15 02/22/2024 0743   INR  Lab Results  Component Value Date   INR 1.0 02/22/2024   APTT  Lab Results  Component Value Date   APTT 38 (H) 02/22/2024   AED levels: No results found for: PHENYTOIN, ZONISAMIDE, LAMOTRIGINE, LEVETIRACETA  CT Head without contrast(Personally reviewed): 1. No acute intracranial abnormality.   CT angio Head and Neck with contrast(Personally reviewed): ***  MR Angio head without contrast and Carotid Duplex BL(Personally reviewed): ***  MRI Brain wo Contrast (Personally reviewed): 1. 16 mm acute infarct within the right corona radiata. 2. Subcentimeter acute infarct within the right lentiform nucleus. 3. Background chronic small vessel ischemic changes within the cerebral white matter, overall moderate in severity and greater than expected for age. 4. Mild paranasal sinus mucosal thickening. 5. Trace right mastoid effusion.   ASSESSMENT   Zachary Cuevas is a 58 y.o. male with a history of hypertension and hypercholesterolemia who presents to ED with concerns for left extremity changes that began around approximately 8:30pm -9:00pm last night. Patients sensation to upper and lower extremities are intact, as well as bilateral 5/5 strength intact.  RECOMMENDATIONS  *** ______________________________________________________________________    Bonney Nidia Bunker, Student-PA [facility]

## 2024-02-22 NOTE — Progress Notes (Signed)
  Echocardiogram 2D Echocardiogram has been performed.  Zachary Cuevas 02/22/2024, 3:31 PM

## 2024-02-22 NOTE — ED Provider Notes (Signed)
 [place] EMERGENCY DEPARTMENT AT [facility] Provider Note   CSN: [id number] Arrival date & time: 02/22/24  [id number]     Patient presents with: Weakness   Zachary Cuevas is a 58 y.o. male.   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  Notes the symptoms around 8:30 PM last night.  Primarily noticed left leg weakness but does feel like he has a little bit of left arm weakness and dropped his phone yesterday.  No headache, vision changes, facial symptoms, speech difficulty.  No neck pain or chest pain.  At times he has felt like his left leg has been numb but for the most part no sensory changes.  He is noticing a little bit more difficulty walking with the leg.  Prior to Admission medications   Medication Sig Start Date End Date Taking? Authorizing Provider  acetaminophen  (TYLENOL ) 325 MG tablet Take 2 tablets (650 mg total) by mouth every 6 (six) hours as needed for mild pain. 07/18/16  Yes Barrett, Rocky SAUNDERS, PA-C  aspirin  EC 81 MG tablet Take 1 tablet (81 mg total) by mouth daily. 04/03/23  Yes Nahser, Aleene PARAS, MD  Aspirin -Acetaminophen -Caffeine (GOODYS EXTRA STRENGTH PO) Take 1 Package by mouth 2 (two) times daily as needed (Pain).   Yes [provider]  atorvastatin  (LIPITOR ) 40 MG tablet Take 1 tablet (40 mg total) by mouth daily. 04/03/23  Yes Nahser, Aleene PARAS, MD  carvedilol  (COREG ) 6.25 MG tablet Take 1 tablet (6.25 mg total) by mouth 2 (two) times daily. 07/04/23  Yes Weaver, Michaeljoseph Revolorio T, PA-C  losartan  (COZAAR ) 50 MG tablet Take 1 tablet (50 mg total) by mouth daily. 02/09/24  Yes Lelon Hamilton T, PA-C    Allergies: Penicillins    Review of Systems  Eyes:  Negative for visual disturbance.  Cardiovascular:  Negative for chest pain.  Neurological:  Positive for weakness. Negative for numbness and headaches.    Updated Vital Signs BP (!) 171/105 (BP Location: Right Arm)   Pulse 64   Temp 97.9 F (36.6  C) (Oral)   Resp 18   Ht 6' 1 (1.854 m)   Wt 80.8 kg   SpO2 98%   BMI 23.50 kg/m   Physical Exam Vitals and nursing note reviewed.  Constitutional:      Appearance: He is well-developed.  HENT:     Head: Normocephalic and atraumatic.  Eyes:     Extraocular Movements: Extraocular movements intact.     Pupils: Pupils are equal, round, and reactive to light.  Cardiovascular:     Rate and Rhythm: Normal rate and regular rhythm.     Pulses:          Dorsalis pedis pulses are 2+ on the left side.     Heart sounds: Normal heart sounds.  Pulmonary:     Effort: Pulmonary effort is normal.     Breath sounds: Normal breath sounds.  Abdominal:     Palpations: Abdomen is soft.     Tenderness: There is no abdominal tenderness.  Skin:    General: Skin is warm and dry.  Neurological:     Mental Status: He is alert.     Comments: CN 3-12 grossly intact. 5/5 strength in all 4 extremities, though there is some notable difficulty with lifting left leg off stretcher and against resistance.  He has some subtle arm drift on the left.  Grossly normal sensation. Normal finger to nose.      (  all labs ordered are listed, but only abnormal results are displayed) Labs Reviewed  APTT - Abnormal; Notable for the following components:      Result Value   aPTT 38 (*)    All other components within normal limits  COMPREHENSIVE METABOLIC PANEL WITH GFR - Abnormal; Notable for the following components:   Glucose, Bld 107 (*)    All other components within normal limits  URINALYSIS, ROUTINE W REFLEX MICROSCOPIC - Abnormal; Notable for the following components:   Color, Urine COLORLESS (*)    Specific Gravity, Urine 1.002 (*)    All other components within normal limits  I-STAT CHEM 8, ED - Abnormal; Notable for the following components:   Glucose, Bld 106 (*)    All other components within normal limits  PROTIME-INR  CBC  DIFFERENTIAL  ETHANOL  RAPID URINE DRUG SCREEN, HOSP PERFORMED  HIV  ANTIBODY (ROUTINE TESTING W REFLEX)  LIPID PANEL  HEMOGLOBIN A1C    EKG: EKG Interpretation Date/Time:  Thursday February 22 2024 07:48:14 EDT Ventricular Rate:  52 PR Interval:  148 QRS Duration:  91 QT Interval:  446 QTC Calculation: 415 R Axis:   58  Text Interpretation: Sinus rhythm Probable left atrial enlargement LVH with secondary repolarization abnormality no significant change since Nov 2024 Confirmed by Freddi Hamilton [phone]) on 02/22/2024 7:56:44 AM  Radiology: MR BRAIN WO CONTRAST Result Date: 02/22/2024 CLINICAL DATA:  Provided history: Neuro deficit, acute, stroke suspected. Additional history provided: Left-sided weakness EXAM: MRI HEAD WITHOUT CONTRAST MRI CERVICAL SPINE WITHOUT CONTRAST TECHNIQUE: Multiplanar, multiecho pulse sequences of the brain and surrounding structures, and cervical spine, to include the craniocervical junction and cervicothoracic junction, were obtained without intravenous contrast. COMPARISON:  Head CT 02/22/2024. FINDINGS: MRI HEAD FINDINGS Brain: No age-advanced or lobar predominant cerebral atrophy. 16 mm acute infarct within the right corona radiata (series 2, images 31-33). Subcentimeter acute infarct within the right lentiform nucleus (series 2, image 28). Multifocal T2 FLAIR hyperintense signal abnormality elsewhere within the cerebral white matter, overall moderate in severity and greater than expected for age. Findings are nonspecific, but compatible with chronic small vessel ischemic disease. No evidence of an intracranial mass. No chronic intracranial blood products. No extra-axial fluid collection. No midline shift. Vascular: Maintained flow voids within the proximal large arterial vessels. Skull and upper cervical spine: No focal worrisome marrow lesion. Sinuses/Orbits: No mass or acute finding within the imaged orbits. Mild mucosal thickening within the right frontal, bilateral ethmoid and left sphenoid sinuses. Other: Trace fluid within  right mastoid air cells. MRI CERVICAL SPINE FINDINGS Alignment: Trace C5-C6 grade 1 anterolisthesis. Vertebrae: Cervical vertebral body height is maintained. Marrow edema within the posterior elements on the left at C5-C6, likely degenerative and related to facet arthropathy. Cord: No signal abnormality identified within the cervical spinal cord. Posterior Fossa, vertebral arteries, paraspinal tissues: Posterior fossa assessed on same-day brain MRI. Flow voids preserved within visible portions of the cervical vertebral arteries. No paraspinal mass or collection. Mild soft tissue edema adjacent to the left C5-C6 facet joint. Disc levels: No more than mild disc degeneration within the cervical spine. C2-C3: Mild facet arthropathy on the left. No significant disc herniation or stenosis. C3-C4: Small central disc protrusion. Mild facet arthropathy on the left. No significant spinal canal or foraminal stenosis. C4-C5: Small central disc protrusion. Facet arthropathy (greater on the left and moderate on the left). No significant spinal canal stenosis. Mild left neural foraminal narrowing. C5-C6: Trace grade 1 anterolisthesis. Shallow disc bulge. Uncovertebral hypertrophy (  predominantly on the left). Facet arthropathy (greater on the left and moderate on the left. Trace left facet joint effusion. No significant spinal canal stenosis. Mild-to-moderate left neural foraminal narrowing. C6-C7: No significant disc herniation or stenosis. C7-T1: No significant disc herniation or stenosis. IMPRESSION: MRI brain: 1. 16 mm acute infarct within the right corona radiata. 2. Subcentimeter acute infarct within the right lentiform nucleus. 3. Background chronic small vessel ischemic changes within the cerebral white matter, overall moderate in severity and greater than expected for age. 4. Mild paranasal sinus mucosal thickening. 5. Trace right mastoid effusion. MRI cervical spine: 1. Cervical spondylosis as outlined within the body of  the report. 2. No significant spinal canal stenosis. 3. Neural foraminal narrowing on the left at C4-C5 (mild) and C5-C6 (mild-to-moderate). 4. Marrow edema within the posterior elements on the left at C5-C6, likely degenerative. Moderate facet arthropathy, trace facet joint effusion and mild adjacent soft tissue edema also present at this site. Electronically Signed   By: Rockey Childs D.O.   On: 02/22/2024 12:04   MR Cervical Spine Wo Contrast Result Date: 02/22/2024 CLINICAL DATA:  Provided history: Neuro deficit, acute, stroke suspected. Additional history provided: Left-sided weakness EXAM: MRI HEAD WITHOUT CONTRAST MRI CERVICAL SPINE WITHOUT CONTRAST TECHNIQUE: Multiplanar, multiecho pulse sequences of the brain and surrounding structures, and cervical spine, to include the craniocervical junction and cervicothoracic junction, were obtained without intravenous contrast. COMPARISON:  Head CT 02/22/2024. FINDINGS: MRI HEAD FINDINGS Brain: No age-advanced or lobar predominant cerebral atrophy. 16 mm acute infarct within the right corona radiata (series 2, images 31-33). Subcentimeter acute infarct within the right lentiform nucleus (series 2, image 28). Multifocal T2 FLAIR hyperintense signal abnormality elsewhere within the cerebral white matter, overall moderate in severity and greater than expected for age. Findings are nonspecific, but compatible with chronic small vessel ischemic disease. No evidence of an intracranial mass. No chronic intracranial blood products. No extra-axial fluid collection. No midline shift. Vascular: Maintained flow voids within the proximal large arterial vessels. Skull and upper cervical spine: No focal worrisome marrow lesion. Sinuses/Orbits: No mass or acute finding within the imaged orbits. Mild mucosal thickening within the right frontal, bilateral ethmoid and left sphenoid sinuses. Other: Trace fluid within right mastoid air cells. MRI CERVICAL SPINE FINDINGS Alignment: Trace  C5-C6 grade 1 anterolisthesis. Vertebrae: Cervical vertebral body height is maintained. Marrow edema within the posterior elements on the left at C5-C6, likely degenerative and related to facet arthropathy. Cord: No signal abnormality identified within the cervical spinal cord. Posterior Fossa, vertebral arteries, paraspinal tissues: Posterior fossa assessed on same-day brain MRI. Flow voids preserved within visible portions of the cervical vertebral arteries. No paraspinal mass or collection. Mild soft tissue edema adjacent to the left C5-C6 facet joint. Disc levels: No more than mild disc degeneration within the cervical spine. C2-C3: Mild facet arthropathy on the left. No significant disc herniation or stenosis. C3-C4: Small central disc protrusion. Mild facet arthropathy on the left. No significant spinal canal or foraminal stenosis. C4-C5: Small central disc protrusion. Facet arthropathy (greater on the left and moderate on the left). No significant spinal canal stenosis. Mild left neural foraminal narrowing. C5-C6: Trace grade 1 anterolisthesis. Shallow disc bulge. Uncovertebral hypertrophy (predominantly on the left). Facet arthropathy (greater on the left and moderate on the left. Trace left facet joint effusion. No significant spinal canal stenosis. Mild-to-moderate left neural foraminal narrowing. C6-C7: No significant disc herniation or stenosis. C7-T1: No significant disc herniation or stenosis. IMPRESSION: MRI brain: 1. 16 mm  acute infarct within the right corona radiata. 2. Subcentimeter acute infarct within the right lentiform nucleus. 3. Background chronic small vessel ischemic changes within the cerebral white matter, overall moderate in severity and greater than expected for age. 4. Mild paranasal sinus mucosal thickening. 5. Trace right mastoid effusion. MRI cervical spine: 1. Cervical spondylosis as outlined within the body of the report. 2. No significant spinal canal stenosis. 3. Neural  foraminal narrowing on the left at C4-C5 (mild) and C5-C6 (mild-to-moderate). 4. Marrow edema within the posterior elements on the left at C5-C6, likely degenerative. Moderate facet arthropathy, trace facet joint effusion and mild adjacent soft tissue edema also present at this site. Electronically Signed   By: Rockey Childs D.O.   On: 02/22/2024 12:04   CT HEAD WO CONTRAST Result Date: 02/22/2024 EXAM: CT HEAD WITHOUT CONTRAST 02/22/2024 08:22:21 AM TECHNIQUE: CT of the head was performed without the administration of intravenous contrast. Automated exposure control, iterative reconstruction, and/or weight based adjustment of the mA/kV was utilized to reduce the radiation dose to as low as reasonably achievable. COMPARISON: None available. CLINICAL HISTORY: Neuro deficit, acute, stroke suspected. Triage notes; PT presents POV from home with complaints of left sided weakness that started last around 9pm. Denies pain. States that left arm and left leg feel weaker than right. Pt denies blurred vision, denies headache, denies falls. FINDINGS: BRAIN AND VENTRICLES: There is moderate calcific atheromatous disease within the carotid siphons and vertebral arteries. No acute hemorrhage. No evidence of acute infarct. No hydrocephalus. No extra-axial collection. No mass effect or midline shift. ORBITS: No acute abnormality. SINUSES: There is mild mucosal disease within the floor of the right frontal sinus, right anterior ethmoid air cells, and left sphenoid sinus. SOFT TISSUES AND SKULL: No acute soft tissue abnormality. No skull fracture. IMPRESSION: 1. No acute intracranial abnormality. Electronically signed by: Evalene Coho MD 02/22/2024 08:34 AM EDT RP Workstation: [id number]     Procedures   Medications Ordered in the ED  enoxaparin  (LOVENOX ) injection 40 mg (has no administration in time range)  clopidogrel  (PLAVIX ) tablet 300 mg (has no administration in time range)    And  clopidogrel  (PLAVIX ) tablet  75 mg (has no administration in time range)  atorvastatin  (LIPITOR ) tablet 40 mg (has no administration in time range)  aspirin  EC tablet 81 mg (has no administration in time range)                                    Medical Decision Making Amount and/or Complexity of Data Reviewed External Data Reviewed: notes. Labs: ordered.    Details: Normal WBC Radiology: ordered and independent interpretation performed.    Details: No head bleed, MRI confirms right sided stroke ECG/medicine tests: ordered and independent interpretation performed.    Details: Sinus rhythm  Risk Decision regarding hospitalization.   Patient presents with stroke.  No LVO symptoms and he is well outside the TNK window.  Discussed with neurology, they are requesting MRI brain and C-spine to ensure this truly a stroke before admission.  Otherwise, MRI has now confirmed stroke and he will need admission, and I have consulted the internal medicine teaching service.  Will allow permissive hypertension at this time.     Final diagnoses:  Acute ischemic stroke [facility])    ED Discharge Orders     None          Freddi Hamilton, MD 02/22/24 [phone]

## 2024-02-23 ENCOUNTER — Other Ambulatory Visit (HOSPITAL_COMMUNITY): Payer: Self-pay

## 2024-02-23 DIAGNOSIS — E785 Hyperlipidemia, unspecified: Secondary | ICD-10-CM

## 2024-02-23 DIAGNOSIS — I6381 Other cerebral infarction due to occlusion or stenosis of small artery: Secondary | ICD-10-CM

## 2024-02-23 DIAGNOSIS — J36 Peritonsillar abscess: Secondary | ICD-10-CM

## 2024-02-23 DIAGNOSIS — Z7982 Long term (current) use of aspirin: Secondary | ICD-10-CM

## 2024-02-23 DIAGNOSIS — I639 Cerebral infarction, unspecified: Secondary | ICD-10-CM

## 2024-02-23 DIAGNOSIS — R29700 NIHSS score 0: Secondary | ICD-10-CM

## 2024-02-23 MED ORDER — ROSUVASTATIN CALCIUM 40 MG PO TABS: 40.0000 mg | ORAL_TABLET | Freq: Every day | ORAL | 0 refills | Status: DC

## 2024-02-23 MED ORDER — STROKE: EARLY STAGES OF RECOVERY BOOK
Freq: Once | Status: AC
  Filled 2024-02-23: qty 1

## 2024-02-23 MED ORDER — CARVEDILOL 6.25 MG PO TABS
6.2500 mg | ORAL_TABLET | Freq: Two times a day (BID) | ORAL | Status: DC
  Filled 2024-02-23: qty 1

## 2024-02-23 MED ORDER — CLOPIDOGREL BISULFATE 75 MG PO TABS
75.0000 mg | ORAL_TABLET | Freq: Every day | ORAL | 0 refills | Status: DC
  Filled 2024-02-23: qty 30, 30d supply, fill #0

## 2024-02-23 MED ORDER — LOSARTAN POTASSIUM 50 MG PO TABS
50.0000 mg | ORAL_TABLET | Freq: Every day | ORAL | Status: DC
  Filled 2024-02-23: qty 1

## 2024-02-23 MED ORDER — ROSUVASTATIN CALCIUM 20 MG PO TABS
40.0000 mg | ORAL_TABLET | Freq: Every day | ORAL | Status: DC
  Administered 2024-02-23: 40 mg via ORAL
  Filled 2024-02-23: qty 1
  Filled 2024-02-23: qty 2

## 2024-02-23 MED ORDER — ASPIRIN 81 MG PO TBEC: 81.0000 mg | DELAYED_RELEASE_TABLET | Freq: Every day | ORAL | 3 refills | Status: DC

## 2024-02-23 MED ORDER — ASPIRIN 81 MG PO TBEC
81.0000 mg | DELAYED_RELEASE_TABLET | Freq: Every day | ORAL | 0 refills | Status: DC
  Filled 2024-02-23: qty 19, 19d supply, fill #0

## 2024-02-23 MED ORDER — CLOPIDOGREL BISULFATE 75 MG PO TABS: 75.0000 mg | ORAL_TABLET | Freq: Every day | ORAL | 0 refills | Status: DC

## 2024-02-23 NOTE — Progress Notes (Addendum)
 Discharge Nurse Summary: DC order noted per MD. DC RN at bedside with patient. Patient agreeable with discharge plan, states family will arrive soon for pickup. AVS printed/reviewed. Extensive stroke education, prevention, early identification, risks, and importance of compliance with close monitoring/medication discussed with the patient/family with teach back. PIV removed, skin intact. No DME needs. No home meds. TOC meds pending pickup. CP/Edu resolved. Telemonitor returned to charging station. All belongings accounted for. Patient wheeled downstairs by volunteer transport, picking up TOC meds for discharge by private auto.   Rosario EMERSON Lund, RN

## 2024-02-23 NOTE — Progress Notes (Addendum)
 STROKE TEAM PROGRESS NOTE   INTERIM HISTORY/SUBJECTIVE Patient presented with dizziness and gait imbalance and MRI scan shows a right basal ganglia lacunar infarct.  CT angiogram shows no large vessel occlusion moderate atherosclerotic changes in bilateral cavernous carotids. Patient remains hemodynamically stable and afebrile overnight.  He reports that his symptoms have resolved.  OBJECTIVE  CBC    Component Value Date/Time   WBC 9.1 02/22/2024 0743   RBC 5.48 02/22/2024 0743   HGB 15.0 02/22/2024 0748   HCT 44.0 02/22/2024 0748   PLT 234 02/22/2024 0743   MCV 83.2 02/22/2024 0743   MCH 27.2 02/22/2024 0743   MCHC 32.7 02/22/2024 0743   RDW 12.6 02/22/2024 0743   LYMPHSABS 2.6 02/22/2024 0743   MONOABS 0.5 02/22/2024 0743   EOSABS 0.4 02/22/2024 0743   BASOSABS 0.1 02/22/2024 0743    BMET    Component Value Date/Time   NA 141 02/22/2024 0748   NA 144 04/17/2023 1510   K 4.1 02/22/2024 0748   CL 104 02/22/2024 0748   CO2 28 02/22/2024 0743   GLUCOSE 106 (H) 02/22/2024 0748   BUN 18 02/22/2024 0748   BUN 14 04/17/2023 1510   CREATININE 1.00 02/22/2024 0748   CREATININE 0.99 03/25/2016 0821   CALCIUM  9.4 02/22/2024 0743   EGFR 91 04/17/2023 1510   GFRNONAA >60 02/22/2024 0743   GFRNONAA >89 03/25/2016 0821    IMAGING past 24 hours CT ANGIO HEAD NECK W WO CM Result Date: 02/22/2024 CLINICAL DATA:  Follow-up examination for stroke. EXAM: CT ANGIOGRAPHY HEAD AND NECK WITH AND WITHOUT CONTRAST TECHNIQUE: Multidetector CT imaging of the head and neck was performed using the standard protocol during bolus administration of intravenous contrast. Multiplanar CT image reconstructions and MIPs were obtained to evaluate the vascular anatomy. Carotid stenosis measurements (when applicable) are obtained utilizing NASCET criteria, using the distal internal carotid diameter as the denominator. RADIATION DOSE REDUCTION: This exam was performed according to the departmental  dose-optimization program which includes automated exposure control, adjustment of the mA and/or kV according to patient size and/or use of iterative reconstruction technique. CONTRAST:  75mL OMNIPAQUE  IOHEXOL  350 MG/ML SOLN COMPARISON:  CT and MRI from earlier the same day. FINDINGS: CTA NECK FINDINGS Aortic arch: Standard branching. Imaged portion shows no evidence of aneurysm or dissection. No significant stenosis of the major arch vessel origins. Right carotid system: No evidence of dissection, stenosis (50% or greater), or occlusion. Minimal for age atheromatous change about the right carotid bulb without stenosis. Left carotid system: No evidence of dissection, stenosis (50% or greater), or occlusion. Minimal for age atheromatous change about the left carotid bulb without stenosis. Vertebral arteries: Codominant. No evidence of dissection, stenosis (50% or greater), or occlusion. Mild nonstenotic atheromatous change noted about the left V1 and V3 segments. Skeleton: No worrisome osseous lesions. Prior sternotomy noted. Poor dentition noted as well. Other neck: Asymmetric enhancement about the right palatine tonsil, nonspecific, but could reflect changes of acute tonsillitis. Superimposed 4-5 mm hypodensity suspicious for a small microabscess (series 7, image 96). Upper chest: No other acute finding. Review of the MIP images confirms the above findings CTA HEAD FINDINGS Anterior circulation: Moderate atheromatous change about the carotid siphons with associated mild to moderate stenoses about the paraclinoid segments bilaterally, slightly worse on the right. A1 segments patent bilaterally. Normal anterior communicating artery complex. Anterior cerebral arteries patent without significant stenosis. No M1 stenosis or occlusion. No proximal MCA branch occlusion. Distal MCA branches perfused and fairly symmetric. Posterior circulation:  Mild atheromatous change about the V4 segments without significant stenosis.  Left PICA patent. Right PICA origin not well seen. Basilar patent without stenosis. Superior cerebral arteries patent bilaterally. Right PCA supplied primarily via the basilar. Left PCA supplied via a hypoplastic left P1 segment and prominent left posterior communicating artery. Mild atheromatous irregularity about the PCAs bilaterally without hemodynamically significant stenosis. Venous sinuses: Patent allowing for timing the contrast bolus. Anatomic variants: As above.  No aneurysm. Review of the MIP images confirms the above findings IMPRESSION: 1. Negative CTA for large vessel occlusion or other emergent finding. 2. Moderate atheromatous change about the carotid siphons with associated mild to moderate stenoses about the paraclinoid segments bilaterally, slightly worse on the right. No other hemodynamically significant or correctable stenosis about the major arterial vasculature of the head and neck. 3. Asymmetric enhancement about the right palatine tonsil, nonspecific, but could reflect changes of acute tonsillitis. Superimposed 4-5 mm hypodensity suspicious for a small microabscess. Correlation with physical exam recommended. Electronically Signed   By: Zachary Cuevas M.D.   On: 02/22/2024 19:59   ECHOCARDIOGRAM COMPLETE BUBBLE STUDY Result Date: 02/22/2024    ECHOCARDIOGRAM REPORT   Patient Name:   [name] Date of Exam: 02/22/2024 Medical Rec #:  [id number]     Height:       73.0 in Accession #:    [id number]    Weight:       178.1 lb Date of Birth:  02-Jul-1965    BSA:          2.048 m Patient Age:    58 years      BP:           171/105 mmHg Patient Gender: M             HR:           69 bpm. Exam Location:  Inpatient Procedure: 2D Echo and Saline Contrast Bubble Study (Both Spectral and Color            Flow Doppler were utilized during procedure). Indications:    Stroke  History:        Patient has prior history of Echocardiogram examinations. CAD;                 Stroke.  Sonographer:     Zachary Cuevas Referring Phys: [phone] Zachary Cuevas IMPRESSIONS  1. Left ventricular ejection fraction, by estimation, is 50%. The left ventricle has mildly decreased function. The left ventricle demonstrates global hypokinesis. Left ventricular diastolic parameters were normal.  2. Right ventricular systolic function is mildly reduced. The right ventricular size is normal. Tricuspid regurgitation signal is inadequate for assessing PA pressure.  3. The mitral valve is normal in structure. Trivial mitral valve regurgitation. No evidence of mitral stenosis.  4. The aortic valve is tricuspid. Aortic valve regurgitation is not visualized. No aortic stenosis is present.  5. The inferior vena cava is normal in size with greater than 50% respiratory variability, suggesting right atrial pressure of 3 mmHg. FINDINGS  Left Ventricle: Left ventricular ejection fraction, by estimation, is 50%. The left ventricle has mildly decreased function. The left ventricle demonstrates global hypokinesis. The left ventricular internal cavity size was normal in size. There is no left ventricular hypertrophy. Left ventricular diastolic parameters were normal. Right Ventricle: The right ventricular size is normal. No increase in right ventricular wall thickness. Right ventricular systolic function is mildly reduced. Tricuspid regurgitation signal is inadequate for assessing PA pressure. Left Atrium: Left atrial size  was normal in size. Right Atrium: Right atrial size was normal in size. Pericardium: There is no evidence of pericardial effusion. Mitral Valve: The mitral valve is normal in structure. Trivial mitral valve regurgitation. No evidence of mitral valve stenosis. Tricuspid Valve: The tricuspid valve is normal in structure. Tricuspid valve regurgitation is trivial. Aortic Valve: The aortic valve is tricuspid. Aortic valve regurgitation is not visualized. No aortic stenosis is present. Pulmonic Valve: The pulmonic valve was normal in  structure. Pulmonic valve regurgitation is trivial. Aorta: The aortic root is normal in size and structure. Venous: The inferior vena cava is normal in size with greater than 50% respiratory variability, suggesting right atrial pressure of 3 mmHg. IAS/Shunts: No atrial level shunt detected by color flow Doppler. Agitated saline contrast was given intravenously to evaluate for intracardiac shunting.  LEFT VENTRICLE PLAX 2D LVIDd:         5.40 cm      Diastology LVIDs:         3.40 cm      LV e' medial:    7.80 cm/s LV PW:         0.70 cm      LV E/e' medial:  10.1 LV IVS:        0.80 cm      LV e' lateral:   10.40 cm/s LVOT diam:     1.80 cm      LV E/e' lateral: 7.6 LV SV:         49 LV SV Index:   24 LVOT Area:     2.54 cm  LV Volumes (MOD) LV vol d, MOD A2C: 110.0 ml LV vol d, MOD A4C: 109.0 ml LV vol s, MOD A2C: 49.2 ml LV vol s, MOD A4C: 52.4 ml LV SV MOD A2C:     60.8 ml LV SV MOD A4C:     109.0 ml LV SV MOD BP:      61.0 ml RIGHT VENTRICLE RV Basal diam:  3.70 cm RV S prime:     8.73 cm/s TAPSE (M-mode): 2.1 cm LEFT ATRIUM             Index        RIGHT ATRIUM           Index LA diam:        3.50 cm 1.71 cm/m   RA Area:     13.20 cm LA Vol (A2C):   57.4 ml 28.02 ml/m  RA Volume:   32.90 ml  16.06 ml/m LA Vol (A4C):   52.2 ml 25.48 ml/m LA Biplane Vol: 56.1 ml 27.39 ml/m  AORTIC VALVE LVOT Vmax:   92.40 cm/s LVOT Vmean:  56.600 cm/s LVOT VTI:    0.191 m  AORTA Ao Root diam: 2.90 cm MITRAL VALVE MV Area (PHT): 3.03 cm    SHUNTS MV Decel Time: 250 msec    Systemic VTI:  0.19 m MV E velocity: 78.80 cm/s  Systemic Diam: 1.80 cm MV A velocity: 51.30 cm/s MV E/A ratio:  1.54 Dalton McleanMD Electronically signed by Ezra Kanner Signature Date/Time: 02/22/2024/7:32:56 PM    Final     Vitals:   02/22/24 2350 02/23/24 0348 02/23/24 0817 02/23/24 1142  BP: (!) 168/108 (!) 167/116 (!) 164/117 (!) 154/109  Pulse: 62 77 73 67  Resp: 18 18 18 18   Temp: 98 F (36.7 C) (!) 97.5 F (36.4 C) (!) 97.4 F (36.3  C) (!) 97.5 F (36.4 C)  TempSrc: Oral Oral  Oral Oral  SpO2: 96% 95% 93% 99%  Weight:      Height:         PHYSICAL EXAM General:  Alert, well-nourished, well-developed patient in no acute distress Psych:  Mood and affect appropriate for situation CV: Regular rate and rhythm on monitor Respiratory:  Regular, unlabored respirations on room air   NEURO:  Mental Status: AA&Ox3, patient is able to give clear and coherent history Speech/Language: speech is without dysarthria or aphasia.    Cranial Nerves:  II: PERRL. Visual fields full.  III, IV, VI: EOMI. Eyelids elevate symmetrically.  V: Sensation is intact to light touch and symmetrical to face.  VII: Face is symmetrical resting and smiling VIII: hearing intact to voice. IX, X: Phonation is normal.  XII: tongue is midline without fasciculations. Motor: 5/5 strength to all muscle groups tested.  Tone: is normal and bulk is normal Sensation- Intact to light touch bilaterally. Extinction absent to light touch to DSS.   Coordination: FTN intact bilaterally Gait- deferred  Most Recent NIH 0  ASSESSMENT/PLAN  [name] is a 58 y.o. male with history of hypertension and hyperlipidemia admitted for acute onset imbalance and unsteadiness.  He was found on MRI to have acute infarcts in the right corona radiata and right lentiform nucleus NIH on Admission 2  Acute Ischemic Infarct: 2 lacunar infarcts in the right corona radiata and right lentiform nucleus Etiology: Small vessel disease CT head No acute abnormality.  CTA head & neck no LVO, mild to moderate stenosis of paraclinoid ICA bilaterally MRI acute ischemic infarct within the right corona radiata and right lentiform nucleus, small vessel ischemic changes MRI cervical spine no significant canal stenosis, neuroforaminal narrowing at left C4-C5 and C5-C6 2D Echo EF 50%, global LV hypokinesis, normal left atrial size, no atrial level shunt LDL 81 HgbA1c 5.8 VTE  prophylaxis -Lovenox  aspirin  81 mg daily prior to admission, now on aspirin  81 mg daily and clopidogrel  75 mg daily for 3 weeks and then Plavix  alone. Therapy recommendations:  Pending Disposition: Pending, likely home  Hypertension Home meds: Carvedilol  6.25 mg twice daily, losartan  50 mg daily Stable Blood Pressure Goal: BP less than 220/110   Hyperlipidemia Home meds: Atorvastatin  40 mg daily, changed to rosuvastatin  40 mg daily LDL 81, goal < 70 Continue statin at discharge  Other Stroke Risk Factors None   Other Active Problems None  Hospital day # 0  Patient seen by NP with MD, MD to edit note as needed. Cortney E Everitt Clint Kill , MSN, AGACNP-BC [facility] See Amion for schedule and pager information 02/23/2024 1:47 PM   I have personally obtained history,examined this patient, reviewed notes, independently viewed imaging studies, participated in medical decision making and plan of care.ROS completed by me personally and pertinent positives fully documented  I have made any additions or clarifications directly to the above note. Agree with note above.  Patient presented with dizziness and imbalance due to right basal ganglia infarct from small vessel disease.  Recommend aspirin  and Plavix  for 3 weeks followed by Plavix  alone and aggressive risk factor modification.  Mobilize out of bed.  Therapy consult.   I personally spent a total of 50 minutes in the care of the patient today including getting/reviewing separately obtained history, performing a medically appropriate exam/evaluation, counseling and educating, placing orders, referring and communicating with other health care professionals, documenting clinical information in the EHR, independently interpreting results, and coordinating care.  Eather Popp, MD Medical Director [facility] Pager: [phone] 02/23/2024 2:14 PM   To contact Stroke Continuity provider, please refer to  [email or web address]. After hours, contact General Neurology

## 2024-02-23 NOTE — Discharge Instructions (Addendum)
 You were hospitalized for a stroke. Thank you for allowing us  to be part of your care.   We have arranged for you to follow-up with our clinic with Dr. [name] on October 6 at 9:00AM  Please note these changes made to your medications:  *Please START taking:  -Rosuvastatin  40mg  daily -Aspirin  81mg  daily -Plavix  75mg   *Please STOP taking:  -Atorvastatin   *Please CONTINUE taking:  -Losartan  50mg  -Carvedilol  6.25mg  every other day  Please call our clinic if you have any questions or concerns, we may be able to help and keep you from a long and expensive emergency room wait. Our clinic and after hours phone number is [phone], the best time to call is Monday through Friday 9 am to 4 pm but there is always someone available 24/7 if you have an emergency. If you need medication refills please notify your pharmacy one week in advance and they will send us  a request.

## 2024-02-23 NOTE — Progress Notes (Signed)
 Discharged to home after IV access removed and discharge instructions reviewed with by DC nurse, Rosario.  All questions answered.

## 2024-02-23 NOTE — TOC CAGE-AID Note (Signed)
 Transition of Care [facility]) - CAGE-AID Screening   Patient Details  Name: Aurel Nguyen MRN: [id number] Date of Birth: 04/19/1966  Transition of Care [facility]) CM/SW Contact:    Glynda Soliday E Tahisha Hakim, LCSW Phone Number: 02/23/2024, 12:07 PM   Clinical Narrative: Denies substance use concerns/resource needs.   CAGE-AID Screening:    Have You Ever Felt You Ought to Cut Down on Your Drinking or Drug Use?: No Have People Annoyed You By Critizing Your Drinking Or Drug Use?: No Have You Felt Bad Or Guilty About Your Drinking Or Drug Use?: No Have You Ever Had a Drink or Used Drugs First Thing In The Morning to Steady Your Nerves or to Get Rid of a Hangover?: No CAGE-AID Score: 0  Substance Abuse Education Offered: Yes

## 2024-02-23 NOTE — Progress Notes (Addendum)
 Patient evaluated after bedside after concerns of dizziness, lightheadedness, and feeling flushed. Episode lasted less than a minute. Patient has had some of these episodes in the past that all resolved. Unclear etiology at this point. Orthostatics negative at this time.  On exam, there are no new neuro deficits from the exam prior.  Unclear etiology at this time, could represent changes in the patient's blood pressure or other underlying cause. If secondary hypertension is suspected, could potentially workup for pheochromocytoma. Patient remains stable for discharge.  Per neurology, will continue with aspirin  and Plavix  for 3 weeks followed by Plavix  monotherapy, opposite of previously stated regimen in discharge summary.

## 2024-02-23 NOTE — TOC Initial Note (Addendum)
 Transition of Care [facility]) - Discharge note   Patient Details  Name: Zachary Cuevas MRN: [id number] Date of Birth: 02/15/1966  Transition [facility]) CM/SW Contact:    Andrez JULIANNA George, RN Phone Number: 02/23/2024, 11:02 AM  Clinical Narrative:                 58 yo M with hx of NSTEMI and CABG, PFO closure 2018 (on 4 drugs- statin, baby asa, ? Per pt/carvedilol , losartan ) who developed let weakness and stumbling last pm.   Pt is from home with his spouse. Pt works day shift. Wife can provide needed supervision at home. Pt denies issues with home medications.  Pt drives but spouse can provide transportation as needed.   No PCP or health insurance. [facility] is picking him up in their clinic and information is on the AVS for an appointment.  CM will send his information to financial counseling to screen for medicaid.   Pt is discharging home and wife will provide needed transportation.    Expected Discharge Plan:  (TBD) Barriers to Discharge: Continued Medical Work up   Patient Goals and CMS Choice            Expected Discharge Plan and Services   Discharge Planning Services: CM Consult   Living arrangements for the past 2 months: Single Family Home                                      Prior Living Arrangements/Services Living arrangements for the past 2 months: Single Family Home Lives with:: Spouse Patient language and need for interpreter reviewed:: Yes Do you feel safe going back to the place where you live?: Yes        Care giver support system in place?: Yes (comment) Current home services: DME (rollator/ cane/ walker/ shower seat) Criminal Activity/Legal Involvement Pertinent to Current Situation/Hospitalization: No - Comment as needed  Activities of Daily Living   ADL Screening (condition at time of admission) Independently performs ADLs?: Yes (appropriate for developmental age) Is the patient deaf or have difficulty hearing?: No Does  the patient have difficulty seeing, even when wearing glasses/contacts?: No Does the patient have difficulty concentrating, remembering, or making decisions?: No  Permission Sought/Granted                  Emotional Assessment Appearance:: Appears stated age Attitude/Demeanor/Rapport: Engaged Affect (typically observed): Accepting Orientation: : Oriented to Self, Oriented to Place, Oriented to  Time, Oriented to Situation   Psych Involvement: No (comment)  Admission diagnosis:  Acute ischemic stroke [facility]) [I63.9] Acute cerebral infarction [facility]) [I63.9] Patient Active Problem List   Diagnosis Date Noted   Acute cerebral infarction (HCC) 02/22/2024   Left leg weakness 02/22/2024   Hypertension 07/03/2023   Hyperlipidemia 09/28/2016   Cardiomyopathy (HCC) 09/28/2016   S/P CABG x 2    Coronary artery disease 07/14/2016   Non-STEMI (non-ST elevated myocardial infarction) (HCC) 07/13/2016   Allergy    PCP:  Duanne Butler DASEN, MD Pharmacy:   [facility] [phone] [address], [place] - [address] [place] [address] Phone: [phone] Fax: [phone]     Social Drivers of Health (SDOH) Social History: SDOH Screenings   Food Insecurity: No Food Insecurity (02/22/2024)  Housing: Low Risk  (02/22/2024)  Transportation Needs: No Transportation Needs (02/22/2024)  Utilities: Not At  Risk (02/22/2024)  Social Connections: Moderately Isolated (02/22/2024)  Tobacco Use: Medium Risk (02/22/2024)   SDOH Interventions:     Readmission Risk Interventions     No data to display

## 2024-02-23 NOTE — Discharge Summary (Signed)
 Name: Zachary Cuevas MRN: [id number] DOB: [birth year] 58 y.o. PCP: Duanne Butler DASEN, MD  Date of Admission: 02/22/2024  7:20 AM Date of Discharge: 9/26 Attending Physician: Dr. Reyes Fenton  Discharge Diagnosis: 1. Principal Problem:   Acute cerebral infarction [facility]) Active Problems:   S/P CABG x 2   Hyperlipidemia   Cardiomyopathy (HCC)   Hypertension   Left leg weakness   Discharge Medications: Allergies as of 02/23/2024       Reactions   Penicillins Other (See Comments)   Childhood allergy - does not remember exact reaction        Medication List     STOP taking these medications    acetaminophen  325 MG tablet Commonly known as: TYLENOL    atorvastatin  40 MG tablet Commonly known as: LIPITOR    GOODYS EXTRA STRENGTH PO       TAKE these medications    aspirin  EC 81 MG tablet Take 1 tablet (81 mg total) by mouth daily. Swallow whole. Start taking on: February 24, 2024 What changed: additional instructions   carvedilol  6.25 MG tablet Commonly known as: COREG  Take 1 tablet (6.25 mg total) by mouth 2 (two) times daily.   clopidogrel  75 MG tablet Commonly known as: PLAVIX  Take 1 tablet (75 mg total) by mouth daily. Start taking on: February 24, 2024   losartan  50 MG tablet Commonly known as: COZAAR  Take 1 tablet (50 mg total) by mouth daily.   rosuvastatin  40 MG tablet Commonly known as: CRESTOR  Take 1 tablet (40 mg total) by mouth daily. Start taking on: February 24, 2024        Disposition and follow-up:   Mr.Zachary Cuevas was discharged from [facility] in Stable condition.  At the hospital follow up visit please address:  #Acute cerebral infarction #Left leg weakness Left leg weakness with 2 lacunar infarcts. Started on Aspirin , Plavix  (21 day course), and Rosuvastatin . Please assess for symptom improvement. Assess medication compliance. Assess work function as he works in [employer].  #Hypertension Home  medications Losartan  and Carvedilol . BP elevated during admission and likely contributed to stroke. Assess BP, patient will likely need escalation of his antihypertensives.  #HLD Started on Rosuvastatin  40mg . Goal 81mg . Recheck lipid panel in 6-8 weeks.   #Current cigar smoker Advised cessation. Assess continued smoking. Assess whether the patient would like any pharmacologic assistance.   #Cardiomyopathy Chronic with EF of 45 to 50% in 2018 after NSTEMI and bypass surgery. Now with EF of 50% and decreased function of both ventricles. No exacerbation this admission. Please consider referral to cardiology.  #Microabscess of tonsil Incidental finding of asymmetric enhancement about the right palatine tonsil, nonspecific, but could reflect changes of acute tonsillitis. Superimposed 4-5 mm hypodensity suspicious for a small microabscess. Patient was asymptomatic. Please assess for tonsillitis symptoms and treat if appropriate. Likely will not need treatment  2.  Labs / imaging needed at time of follow-up: Lipid panel 6-8 weeks after hospitalization.  3.  Pending labs/ test needing follow-up: None  Follow-up Appointments:  Follow-up Information     Zachary Bitters, DO Follow up on 03/04/2024.   Specialty: Internal Medicine Why: Go at 10/6 at 9:00AM Contact information: [address] [place] [address] [phone]                  Hospital Course by problem list: Zachary Cuevas s a 58 year old male with history of HTN, HLD, CABG x2, and NSTEMI in 2018, who presented with acute left  leg weakness and difficulty walking, admitted for acute cerebral infarction in the right corona radiata and right lentiform nucleus.   #Acute cerebral infarction #Left leg weakness He presented after an episode of stumbling on the day prior to admission and had left lower extremity subjective weakness. MRI showed 2 small infarcts, a 16 mm acute infarct within the right corona radiata and  a subcentimeter acute infarct within the right lentiform nucleus. Likely due to his history of HLD and uncontrolled hypertension. Neurology saw the patient and recommend aspirin  and Plavix . A1C 5.8. LDL 81 and Triglycerides 172. CTA showed no acute changes in the large vessels of the head and neck with atheromatous changes of the carotids. Echo showed mildly reduced function in both ventricles with EF of 50%, consistent with prior echo. He was transitioned to Rosuvastatin  40mg . Continue Aspirin  81mg  and Plavix  75mg  daily. At discharge, he had some minor weakness in his left leg but was able to ambulate without difficulty.    #Hypertension Home medications Losartan  and Carvedilol . BP on presentation 186/122, now 164/117. Initially we were allowing permissive hypertension but we are out of this window now so we will resume his home medication at discharge. Will require tighter control of his BP of this continued to be elevated.  #HLD Lipid panel showed LDL of 81 and Triglycerides. Was taking Atorvastatin  40mg  prior to admission. Goal LDL less than 70. Started on rosuvastatin  40mg  daily.   #Current cigar smoker Advised cessation. Likely contributing to his hypertension and recent CVA. Advised cessation of marajuana as well.   #Cardiomyopathy Chronic with EF of 45 to 50% in 2018 after NSTEMI and bypass surgery. Now with EF of 50% and decreased function of both ventricles. No exacerbation this admission. Should follow up with cardiology outpatient.      Subjective Patient reports some left-sided residual weakness but overall feels okay. Decreased appetite. No problems swallowing or pain with doing so, but states his appetite has been low for the last week. Denies fever or chills. He has been able to get up and ambulate well yesterday. No CP, HA, vision changes. Leg strength similar to yesterday. He states he feels like his leg feels heavy but no pins and needles. Concerned with returning to work because  he works in [employer] and does a lot of labor.  Discharge Exam:   BP (!) 164/117 (BP Location: Left Arm)   Pulse 73   Temp (!) 97.4 F (36.3 C) (Oral)   Resp 18   Ht 6' 1 (1.854 m)   Wt 80.8 kg   SpO2 93%   BMI 23.50 kg/m  Discharge exam:  Physical Exam Constitutional:      General: He is not in acute distress.    Appearance: He is not ill-appearing.  HENT:     Head: Normocephalic and atraumatic.     Mouth/Throat:     Mouth: Mucous membranes are moist.     Pharynx: No oropharyngeal exudate.  Cardiovascular:     Rate and Rhythm: Normal rate and regular rhythm.  Pulmonary:     Effort: Pulmonary effort is normal. No respiratory distress.     Breath sounds: No wheezing, rhonchi or rales.  Chest:     Chest wall: No tenderness.  Abdominal:     General: Abdomen is flat. There is no distension.  Neurological:     Mental Status: He is alert and oriented to person, place, and time.     Cranial Nerves: No cranial nerve deficit.  Sensory: No sensory deficit.     Motor: Weakness present.     Comments: 4+/5 leg weakness on left comapred to right leg. All other strength normal.      Pertinent Labs, Studies, and Procedures:     Latest Ref Rng & Units 02/22/2024    7:48 AM 02/22/2024    7:43 AM 09/25/2016   10:51 PM  CBC  WBC 4.0 - 10.5 K/uL  9.1  9.8   Hemoglobin 13.0 - 17.0 g/dL 84.9  85.0  87.0   Hematocrit 39.0 - 52.0 % 44.0  45.6  39.2   Platelets 150 - 400 K/uL  234  205        Latest Ref Rng & Units 02/22/2024    7:48 AM 02/22/2024    7:43 AM 04/17/2023    3:10 PM  CMP  Glucose 70 - 99 mg/dL 893  892  896   BUN 6 - 20 mg/dL 18  17  14    Creatinine 0.61 - 1.24 mg/dL 8.99  9.01  9.01   Sodium 135 - 145 mmol/L 141  139  144   Potassium 3.5 - 5.1 mmol/L 4.1  4.1  4.1   Chloride 98 - 111 mmol/L 104  103  105   CO2 22 - 32 mmol/L  28  25   Calcium  8.9 - 10.3 mg/dL  9.4  9.6   Total Protein 6.5 - 8.1 g/dL  7.3    Total Bilirubin 0.0 - 1.2 mg/dL  0.9    Alkaline  Phos 38 - 126 U/L  121    AST 15 - 41 U/L  21    ALT 0 - 44 U/L  16      CT ANGIO HEAD NECK W WO CM Result Date: 02/22/2024 CLINICAL DATA:  Follow-up examination for stroke. EXAM: CT ANGIOGRAPHY HEAD AND NECK WITH AND WITHOUT CONTRAST TECHNIQUE: Multidetector CT imaging of the head and neck was performed using the standard protocol during bolus administration of intravenous contrast. Multiplanar CT image reconstructions and MIPs were obtained to evaluate the vascular anatomy. Carotid stenosis measurements (when applicable) are obtained utilizing NASCET criteria, using the distal internal carotid diameter as the denominator. RADIATION DOSE REDUCTION: This exam was performed according to the departmental dose-optimization program which includes automated exposure control, adjustment of the mA and/or kV according to patient size and/or use of iterative reconstruction technique. CONTRAST:  75mL OMNIPAQUE  IOHEXOL  350 MG/ML SOLN COMPARISON:  CT and MRI from earlier the same day. FINDINGS: CTA NECK FINDINGS Aortic arch: Standard branching. Imaged portion shows no evidence of aneurysm or dissection. No significant stenosis of the major arch vessel origins. Right carotid system: No evidence of dissection, stenosis (50% or greater), or occlusion. Minimal for age atheromatous change about the right carotid bulb without stenosis. Left carotid system: No evidence of dissection, stenosis (50% or greater), or occlusion. Minimal for age atheromatous change about the left carotid bulb without stenosis. Vertebral arteries: Codominant. No evidence of dissection, stenosis (50% or greater), or occlusion. Mild nonstenotic atheromatous change noted about the left V1 and V3 segments. Skeleton: No worrisome osseous lesions. Prior sternotomy noted. Poor dentition noted as well. Other neck: Asymmetric enhancement about the right palatine tonsil, nonspecific, but could reflect changes of acute tonsillitis. Superimposed 4-5 mm hypodensity  suspicious for a small microabscess (series 7, image 96). Upper chest: No other acute finding. Review of the MIP images confirms the above findings CTA HEAD FINDINGS Anterior circulation: Moderate atheromatous change about the carotid siphons with  associated mild to moderate stenoses about the paraclinoid segments bilaterally, slightly worse on the right. A1 segments patent bilaterally. Normal anterior communicating artery complex. Anterior cerebral arteries patent without significant stenosis. No M1 stenosis or occlusion. No proximal MCA branch occlusion. Distal MCA branches perfused and fairly symmetric. Posterior circulation: Mild atheromatous change about the V4 segments without significant stenosis. Left PICA patent. Right PICA origin not well seen. Basilar patent without stenosis. Superior cerebral arteries patent bilaterally. Right PCA supplied primarily via the basilar. Left PCA supplied via a hypoplastic left P1 segment and prominent left posterior communicating artery. Mild atheromatous irregularity about the PCAs bilaterally without hemodynamically significant stenosis. Venous sinuses: Patent allowing for timing the contrast bolus. Anatomic variants: As above.  No aneurysm. Review of the MIP images confirms the above findings IMPRESSION: 1. Negative CTA for large vessel occlusion or other emergent finding. 2. Moderate atheromatous change about the carotid siphons with associated mild to moderate stenoses about the paraclinoid segments bilaterally, slightly worse on the right. No other hemodynamically significant or correctable stenosis about the major arterial vasculature of the head and neck. 3. Asymmetric enhancement about the right palatine tonsil, nonspecific, but could reflect changes of acute tonsillitis. Superimposed 4-5 mm hypodensity suspicious for a small microabscess. Correlation with physical exam recommended. Electronically Signed   By: Morene Hoard M.D.   On: 02/22/2024 19:59    ECHOCARDIOGRAM COMPLETE BUBBLE STUDY Result Date: 02/22/2024    ECHOCARDIOGRAM REPORT   Patient Name:   [facility] Date of Exam: 02/22/2024 Medical Rec #:  [id number]     Height:       73.0 in Accession #:    [id number]    Weight:       178.1 lb Date of Birth:  10/04/1965    BSA:          2.048 m Patient Age:    57 years      BP:           171/105 mmHg Patient Gender: M             HR:           69 bpm. Exam Location:  Inpatient Procedure: 2D Echo and Saline Contrast Bubble Study (Both Spectral and Color            Flow Doppler were utilized during procedure). Indications:    Stroke  History:        Patient has prior history of Echocardiogram examinations. CAD;                 Stroke.  Sonographer:    Norleen Amour Referring Phys: [phone] SCOTT GOLDSTON IMPRESSIONS  1. Left ventricular ejection fraction, by estimation, is 50%. The left ventricle has mildly decreased function. The left ventricle demonstrates global hypokinesis. Left ventricular diastolic parameters were normal.  2. Right ventricular systolic function is mildly reduced. The right ventricular size is normal. Tricuspid regurgitation signal is inadequate for assessing PA pressure.  3. The mitral valve is normal in structure. Trivial mitral valve regurgitation. No evidence of mitral stenosis.  4. The aortic valve is tricuspid. Aortic valve regurgitation is not visualized. No aortic stenosis is present.  5. The inferior vena cava is normal in size with greater than 50% respiratory variability, suggesting right atrial pressure of 3 mmHg. FINDINGS  Left Ventricle: Left ventricular ejection fraction, by estimation, is 50%. The left ventricle has mildly decreased function. The left ventricle demonstrates global hypokinesis. The left ventricular internal cavity size was normal in  size. There is no left ventricular hypertrophy. Left ventricular diastolic parameters were normal. Right Ventricle: The right ventricular size is normal. No increase in right  ventricular wall thickness. Right ventricular systolic function is mildly reduced. Tricuspid regurgitation signal is inadequate for assessing PA pressure. Left Atrium: Left atrial size was normal in size. Right Atrium: Right atrial size was normal in size. Pericardium: There is no evidence of pericardial effusion. Mitral Valve: The mitral valve is normal in structure. Trivial mitral valve regurgitation. No evidence of mitral valve stenosis. Tricuspid Valve: The tricuspid valve is normal in structure. Tricuspid valve regurgitation is trivial. Aortic Valve: The aortic valve is tricuspid. Aortic valve regurgitation is not visualized. No aortic stenosis is present. Pulmonic Valve: The pulmonic valve was normal in structure. Pulmonic valve regurgitation is trivial. Aorta: The aortic root is normal in size and structure. Venous: The inferior vena cava is normal in size with greater than 50% respiratory variability, suggesting right atrial pressure of 3 mmHg. IAS/Shunts: No atrial level shunt detected by color flow Doppler. Agitated saline contrast was given intravenously to evaluate for intracardiac shunting.  LEFT VENTRICLE PLAX 2D LVIDd:         5.40 cm      Diastology LVIDs:         3.40 cm      LV e' medial:    7.80 cm/s LV PW:         0.70 cm      LV E/e' medial:  10.1 LV IVS:        0.80 cm      LV e' lateral:   10.40 cm/s LVOT diam:     1.80 cm      LV E/e' lateral: 7.6 LV SV:         49 LV SV Index:   24 LVOT Area:     2.54 cm  LV Volumes (MOD) LV vol d, MOD A2C: 110.0 ml LV vol d, MOD A4C: 109.0 ml LV vol s, MOD A2C: 49.2 ml LV vol s, MOD A4C: 52.4 ml LV SV MOD A2C:     60.8 ml LV SV MOD A4C:     109.0 ml LV SV MOD BP:      61.0 ml RIGHT VENTRICLE RV Basal diam:  3.70 cm RV S prime:     8.73 cm/s TAPSE (M-mode): 2.1 cm LEFT ATRIUM             Index        RIGHT ATRIUM           Index LA diam:        3.50 cm 1.71 cm/m   RA Area:     13.20 cm LA Vol (A2C):   57.4 ml 28.02 ml/m  RA Volume:   32.90 ml  16.06  ml/m LA Vol (A4C):   52.2 ml 25.48 ml/m LA Biplane Vol: 56.1 ml 27.39 ml/m  AORTIC VALVE LVOT Vmax:   92.40 cm/s LVOT Vmean:  56.600 cm/s LVOT VTI:    0.191 m  AORTA Ao Root diam: 2.90 cm MITRAL VALVE MV Area (PHT): 3.03 cm    SHUNTS MV Decel Time: 250 msec    Systemic VTI:  0.19 m MV E velocity: 78.80 cm/s  Systemic Diam: 1.80 cm MV A velocity: 51.30 cm/s MV E/A ratio:  1.54 Dalton McleanMD Electronically signed by Ezra Kanner Signature Date/Time: 02/22/2024/7:32:56 PM    Final    MR BRAIN WO CONTRAST Result Date: 02/22/2024 CLINICAL DATA:  Provided history: Neuro deficit, acute, stroke suspected. Additional history provided: Left-sided weakness EXAM: MRI HEAD WITHOUT CONTRAST MRI CERVICAL SPINE WITHOUT CONTRAST TECHNIQUE: Multiplanar, multiecho pulse sequences of the brain and surrounding structures, and cervical spine, to include the craniocervical junction and cervicothoracic junction, were obtained without intravenous contrast. COMPARISON:  Head CT 02/22/2024. FINDINGS: MRI HEAD FINDINGS Brain: No age-advanced or lobar predominant cerebral atrophy. 16 mm acute infarct within the right corona radiata (series 2, images 31-33). Subcentimeter acute infarct within the right lentiform nucleus (series 2, image 28). Multifocal T2 FLAIR hyperintense signal abnormality elsewhere within the cerebral white matter, overall moderate in severity and greater than expected for age. Findings are nonspecific, but compatible with chronic small vessel ischemic disease. No evidence of an intracranial mass. No chronic intracranial blood products. No extra-axial fluid collection. No midline shift. Vascular: Maintained flow voids within the proximal large arterial vessels. Skull and upper cervical spine: No focal worrisome marrow lesion. Sinuses/Orbits: No mass or acute finding within the imaged orbits. Mild mucosal thickening within the right frontal, bilateral ethmoid and left sphenoid sinuses. Other: Trace fluid within  right mastoid air cells. MRI CERVICAL SPINE FINDINGS Alignment: Trace C5-C6 grade 1 anterolisthesis. Vertebrae: Cervical vertebral body height is maintained. Marrow edema within the posterior elements on the left at C5-C6, likely degenerative and related to facet arthropathy. Cord: No signal abnormality identified within the cervical spinal cord. Posterior Fossa, vertebral arteries, paraspinal tissues: Posterior fossa assessed on same-day brain MRI. Flow voids preserved within visible portions of the cervical vertebral arteries. No paraspinal mass or collection. Mild soft tissue edema adjacent to the left C5-C6 facet joint. Disc levels: No more than mild disc degeneration within the cervical spine. C2-C3: Mild facet arthropathy on the left. No significant disc herniation or stenosis. C3-C4: Small central disc protrusion. Mild facet arthropathy on the left. No significant spinal canal or foraminal stenosis. C4-C5: Small central disc protrusion. Facet arthropathy (greater on the left and moderate on the left). No significant spinal canal stenosis. Mild left neural foraminal narrowing. C5-C6: Trace grade 1 anterolisthesis. Shallow disc bulge. Uncovertebral hypertrophy (predominantly on the left). Facet arthropathy (greater on the left and moderate on the left. Trace left facet joint effusion. No significant spinal canal stenosis. Mild-to-moderate left neural foraminal narrowing. C6-C7: No significant disc herniation or stenosis. C7-T1: No significant disc herniation or stenosis. IMPRESSION: MRI brain: 1. 16 mm acute infarct within the right corona radiata. 2. Subcentimeter acute infarct within the right lentiform nucleus. 3. Background chronic small vessel ischemic changes within the cerebral white matter, overall moderate in severity and greater than expected for age. 4. Mild paranasal sinus mucosal thickening. 5. Trace right mastoid effusion. MRI cervical spine: 1. Cervical spondylosis as outlined within the body of  the report. 2. No significant spinal canal stenosis. 3. Neural foraminal narrowing on the left at C4-C5 (mild) and C5-C6 (mild-to-moderate). 4. Marrow edema within the posterior elements on the left at C5-C6, likely degenerative. Moderate facet arthropathy, trace facet joint effusion and mild adjacent soft tissue edema also present at this site. Electronically Signed   By: Rockey Childs D.O.   On: 02/22/2024 12:04   MR Cervical Spine Wo Contrast Result Date: 02/22/2024 CLINICAL DATA:  Provided history: Neuro deficit, acute, stroke suspected. Additional history provided: Left-sided weakness EXAM: MRI HEAD WITHOUT CONTRAST MRI CERVICAL SPINE WITHOUT CONTRAST TECHNIQUE: Multiplanar, multiecho pulse sequences of the brain and surrounding structures, and cervical spine, to include the craniocervical junction and cervicothoracic junction, were obtained without intravenous contrast. COMPARISON:  Head CT 02/22/2024. FINDINGS: MRI HEAD FINDINGS Brain: No age-advanced or lobar predominant cerebral atrophy. 16 mm acute infarct within the right corona radiata (series 2, images 31-33). Subcentimeter acute infarct within the right lentiform nucleus (series 2, image 28). Multifocal T2 FLAIR hyperintense signal abnormality elsewhere within the cerebral white matter, overall moderate in severity and greater than expected for age. Findings are nonspecific, but compatible with chronic small vessel ischemic disease. No evidence of an intracranial mass. No chronic intracranial blood products. No extra-axial fluid collection. No midline shift. Vascular: Maintained flow voids within the proximal large arterial vessels. Skull and upper cervical spine: No focal worrisome marrow lesion. Sinuses/Orbits: No mass or acute finding within the imaged orbits. Mild mucosal thickening within the right frontal, bilateral ethmoid and left sphenoid sinuses. Other: Trace fluid within right mastoid air cells. MRI CERVICAL SPINE FINDINGS Alignment: Trace  C5-C6 grade 1 anterolisthesis. Vertebrae: Cervical vertebral body height is maintained. Marrow edema within the posterior elements on the left at C5-C6, likely degenerative and related to facet arthropathy. Cord: No signal abnormality identified within the cervical spinal cord. Posterior Fossa, vertebral arteries, paraspinal tissues: Posterior fossa assessed on same-day brain MRI. Flow voids preserved within visible portions of the cervical vertebral arteries. No paraspinal mass or collection. Mild soft tissue edema adjacent to the left C5-C6 facet joint. Disc levels: No more than mild disc degeneration within the cervical spine. C2-C3: Mild facet arthropathy on the left. No significant disc herniation or stenosis. C3-C4: Small central disc protrusion. Mild facet arthropathy on the left. No significant spinal canal or foraminal stenosis. C4-C5: Small central disc protrusion. Facet arthropathy (greater on the left and moderate on the left). No significant spinal canal stenosis. Mild left neural foraminal narrowing. C5-C6: Trace grade 1 anterolisthesis. Shallow disc bulge. Uncovertebral hypertrophy (predominantly on the left). Facet arthropathy (greater on the left and moderate on the left. Trace left facet joint effusion. No significant spinal canal stenosis. Mild-to-moderate left neural foraminal narrowing. C6-C7: No significant disc herniation or stenosis. C7-T1: No significant disc herniation or stenosis. IMPRESSION: MRI brain: 1. 16 mm acute infarct within the right corona radiata. 2. Subcentimeter acute infarct within the right lentiform nucleus. 3. Background chronic small vessel ischemic changes within the cerebral white matter, overall moderate in severity and greater than expected for age. 4. Mild paranasal sinus mucosal thickening. 5. Trace right mastoid effusion. MRI cervical spine: 1. Cervical spondylosis as outlined within the body of the report. 2. No significant spinal canal stenosis. 3. Neural  foraminal narrowing on the left at C4-C5 (mild) and C5-C6 (mild-to-moderate). 4. Marrow edema within the posterior elements on the left at C5-C6, likely degenerative. Moderate facet arthropathy, trace facet joint effusion and mild adjacent soft tissue edema also present at this site. Electronically Signed   By: Rockey Childs D.O.   On: 02/22/2024 12:04   CT HEAD WO CONTRAST Result Date: 02/22/2024 EXAM: CT HEAD WITHOUT CONTRAST 02/22/2024 08:22:21 AM TECHNIQUE: CT of the head was performed without the administration of intravenous contrast. Automated exposure control, iterative reconstruction, and/or weight based adjustment of the mA/kV was utilized to reduce the radiation dose to as low as reasonably achievable. COMPARISON: None available. CLINICAL HISTORY: Neuro deficit, acute, stroke suspected. Triage notes; PT presents POV from home with complaints of left sided weakness that started last around 9pm. Denies pain. States that left arm and left leg feel weaker than right. Pt denies blurred vision, denies headache, denies falls. FINDINGS: BRAIN AND VENTRICLES: There is moderate calcific atheromatous disease  within the carotid siphons and vertebral arteries. No acute hemorrhage. No evidence of acute infarct. No hydrocephalus. No extra-axial collection. No mass effect or midline shift. ORBITS: No acute abnormality. SINUSES: There is mild mucosal disease within the floor of the right frontal sinus, right anterior ethmoid air cells, and left sphenoid sinus. SOFT TISSUES AND SKULL: No acute soft tissue abnormality. No skull fracture. IMPRESSION: 1. No acute intracranial abnormality. Electronically signed by: Evalene Coho MD 02/22/2024 08:34 AM EDT RP Workstation: [id number]     Discharge Instructions: Discharge Instructions     Call MD for:   Complete by: As directed    New weakness or sensation troubles   Call MD for:  difficulty breathing, headache or visual disturbances   Complete by: As directed     Call MD for:  temperature >100.4   Complete by: As directed    Diet - low sodium heart healthy   Complete by: As directed    Discharge instructions   Complete by: As directed    You were hospitalized for a stroke. Thank you for allowing us  to be part of your care.   We have arranged for you to follow-up with our clinic with Dr. [name] on October 6 at 9:00AM  Please note these changes made to your medications:  *Please START taking:  -Rosuvastatin  40mg  daily -Aspirin  81mg  daily -Plavix  75mg   *Please STOP taking:  -Atorvastatin   *Please CONTINUE taking:  -Losartan  50mg  -Carvedilol  6.25mg  every other day  Please call our clinic if you have any questions or concerns, we may be able to help and keep you from a long and expensive emergency room wait. Our clinic and after hours phone number is [phone], the best time to call is Monday through Friday 9 am to 4 pm but there is always someone available 24/7 if you have an emergency. If you need medication refills please notify your pharmacy one week in advance and they will send us  a request.   Increase activity slowly   Complete by: As directed        Signed: Napoleon Limes, MD 02/23/2024, 11:03 AM

## 2024-02-26 ENCOUNTER — Emergency Department (HOSPITAL_COMMUNITY)
Admission: EM | Admit: 2024-02-26 | Discharge: 2024-02-26 | Disposition: A | Discharge status: Home / Self Care | Payer: Self-pay

## 2024-02-26 ENCOUNTER — Telehealth: Payer: Self-pay

## 2024-02-26 ENCOUNTER — Emergency Department (HOSPITAL_COMMUNITY): Payer: Self-pay

## 2024-02-26 ENCOUNTER — Other Ambulatory Visit: Payer: Self-pay

## 2024-02-26 ENCOUNTER — Encounter (HOSPITAL_COMMUNITY): Payer: Self-pay

## 2024-02-26 DIAGNOSIS — Z79899 Other long term (current) drug therapy: Secondary | ICD-10-CM | POA: Insufficient documentation

## 2024-02-26 DIAGNOSIS — Z955 Presence of coronary angioplasty implant and graft: Secondary | ICD-10-CM | POA: Insufficient documentation

## 2024-02-26 DIAGNOSIS — R2242 Localized swelling, mass and lump, left lower limb: Secondary | ICD-10-CM | POA: Insufficient documentation

## 2024-02-26 DIAGNOSIS — R531 Weakness: Secondary | ICD-10-CM | POA: Insufficient documentation

## 2024-02-26 DIAGNOSIS — Z7982 Long term (current) use of aspirin: Secondary | ICD-10-CM | POA: Insufficient documentation

## 2024-02-26 DIAGNOSIS — Z8673 Personal history of transient ischemic attack (TIA), and cerebral infarction without residual deficits: Secondary | ICD-10-CM | POA: Insufficient documentation

## 2024-02-26 DIAGNOSIS — I639 Cerebral infarction, unspecified: Secondary | ICD-10-CM

## 2024-02-26 DIAGNOSIS — I110 Hypertensive heart disease with heart failure: Secondary | ICD-10-CM | POA: Insufficient documentation

## 2024-02-26 DIAGNOSIS — Z7902 Long term (current) use of antithrombotics/antiplatelets: Secondary | ICD-10-CM | POA: Insufficient documentation

## 2024-02-26 DIAGNOSIS — I2510 Atherosclerotic heart disease of native coronary artery without angina pectoris: Secondary | ICD-10-CM | POA: Insufficient documentation

## 2024-02-26 DIAGNOSIS — I509 Heart failure, unspecified: Secondary | ICD-10-CM | POA: Insufficient documentation

## 2024-02-26 LAB — CBC WITH DIFFERENTIAL/PLATELET
Abs Immature Granulocytes: 0.03 K/uL (ref 0.00–0.07)
Basophils Absolute: 0.1 K/uL (ref 0.0–0.1)
Basophils Relative: 1 %
Eosinophils Absolute: 0.4 K/uL (ref 0.0–0.5)
Eosinophils Relative: 4 %
HCT: 46.9 % (ref 39.0–52.0)
Hemoglobin: 15.6 g/dL (ref 13.0–17.0)
Immature Granulocytes: 0 %
Lymphocytes Relative: 31 %
Lymphs Abs: 3.2 K/uL (ref 0.7–4.0)
MCH: 26.9 pg (ref 26.0–34.0)
MCHC: 33.3 g/dL (ref 30.0–36.0)
MCV: 80.7 fL (ref 80.0–100.0)
Monocytes Absolute: 0.6 K/uL (ref 0.1–1.0)
Monocytes Relative: 6 %
Neutro Abs: 6 K/uL (ref 1.7–7.7)
Neutrophils Relative %: 58 %
Platelets: 254 K/uL (ref 150–400)
RBC: 5.81 MIL/uL (ref 4.22–5.81)
RDW: 12.8 % (ref 11.5–15.5)
WBC: 10.4 K/uL (ref 4.0–10.5)
nRBC: 0 % (ref 0.0–0.2)

## 2024-02-26 LAB — BASIC METABOLIC PANEL WITH GFR
Anion gap: 9 (ref 5–15)
BUN: 17 mg/dL (ref 6–20)
CO2: 27 mmol/L (ref 22–32)
Calcium: 9.5 mg/dL (ref 8.9–10.3)
Chloride: 104 mmol/L (ref 98–111)
Creatinine, Ser: 0.96 mg/dL (ref 0.61–1.24)
GFR, Estimated: >60 mL/min (ref >60)
Glucose, Bld: 105 mg/dL — ABNORMAL HIGH (ref 70–99)
Potassium: 4.3 mmol/L (ref 3.5–5.1)
Sodium: 140 mmol/L (ref 135–145)

## 2024-02-26 MED ORDER — AMLODIPINE BESYLATE 5 MG PO TABS: 5.0000 mg | ORAL_TABLET | Freq: Every day | ORAL | 0 refills | Status: DC

## 2024-02-26 MED ORDER — AMLODIPINE BESYLATE 5 MG PO TABS
5.0000 mg | ORAL_TABLET | Freq: Once | ORAL | Status: AC
  Administered 2024-02-26: 5 mg via ORAL
  Filled 2024-02-26: qty 1

## 2024-02-26 NOTE — ED Provider Notes (Signed)
 [place] EMERGENCY DEPARTMENT AT [facility] Provider Note   CSN: [id number] Arrival date & time: 02/26/24  1830     Patient presents with: Leg Swelling   Zachary Beagle Ramonte Mena. is a 58 y.o. male.   58 year old male with past medical history of CHF and recent CVA presenting to the emergency department today with concern for worsening left lower extremity weakness.  The patient states that when he initially had a stroke few days ago that he was having some numbness and subjective weakness but was able to walk without difficulty.  He states that today that the weakness was worse and he was dragging his leg some.  The patient is uninsured and initially they were talking to them about rehab but he opted not to do this.  The patient came to the emergency department today due to recurrent symptoms.        Prior to Admission medications   Medication Sig Start Date End Date Taking? Authorizing Provider  amLODipine (NORVASC) 5 MG tablet Take 1 tablet (5 mg total) by mouth daily. 02/26/24  Yes Ula Prentice SAUNDERS, MD  aspirin  EC 81 MG tablet Take 1 tablet (81 mg total) by mouth daily. Swallow whole. 02/24/24   Marylu Gee, DO  carvedilol  (COREG ) 6.25 MG tablet Take 1 tablet (6.25 mg total) by mouth 2 (two) times daily. 07/04/23   Lelon Hamilton T, PA-C  clopidogrel  (PLAVIX ) 75 MG tablet Take 1 tablet (75 mg total) by mouth daily. 02/24/24   Marylu Gee, DO  losartan  (COZAAR ) 50 MG tablet Take 1 tablet (50 mg total) by mouth daily. 02/09/24   Lelon Hamilton T, PA-C  rosuvastatin  (CRESTOR ) 40 MG tablet Take 1 tablet (40 mg total) by mouth daily. 02/24/24   Smucker, Melvenia, MD    Allergies: Penicillins    Review of Systems  All other systems reviewed and are negative.   Updated Vital Signs BP (!) 185/110   Pulse 69   Temp 97.9 F (36.6 C) (Oral)   Resp 18   Ht 6' 1 (1.854 m)   Wt 72.6 kg   SpO2 100%   BMI 21.11 kg/m   Physical Exam Vitals and nursing note reviewed.    Gen: NAD Eyes: PERRL, EOMI HEENT: no oropharyngeal swelling Neck: trachea midline Resp: clear to auscultation bilaterally Card: RRR, no murmurs, rubs, or gallops Abd: nontender, nondistended Extremities: no calf tenderness, no edema Vascular: 2+ radial pulses bilaterally, 2+ DP pulses bilaterally Neuro: The patient does have 4-5 strength in the left lower extremity and normal strength in the right lower extremity, denies any sensation deficits, cranial nerves intact, the patient does have normal strength and sensation throughout the bilateral upper extremities Skin: no rashes Psyc: acting appropriately   (all labs ordered are listed, but only abnormal results are displayed) Labs Reviewed  BASIC METABOLIC PANEL WITH GFR - Abnormal; Notable for the following components:      Result Value   Glucose, Bld 105 (*)    All other components within normal limits  CBC WITH DIFFERENTIAL/PLATELET    EKG: None  Radiology: CT Head Wo Contrast Result Date: 02/26/2024 CLINICAL DATA:  Worsening left lower extremity weakness EXAM: CT HEAD WITHOUT CONTRAST TECHNIQUE: Contiguous axial images were obtained from the base of the skull through the vertex without intravenous contrast. RADIATION DOSE REDUCTION: This exam was performed according to the departmental dose-optimization program which includes automated exposure control, adjustment of the mA and/or kV according to patient size and/or use of iterative  reconstruction technique. COMPARISON:  CT brain 02/22/2024 MRI 02/22/2024 FINDINGS: Brain: No acute territorial infarction, hemorrhage or intracranial mass. The ventricles are nonenlarged. Mild chronic small vessel ischemic changes of the white matter. Vague hypodensity within the right white matter, series 3, image 20, corresponding to recent infarct on MRI. Subcentimeter recent infarct within the right lentiform nucleus on MRI is not well seen on CT. Nonenlarged ventricles. Vascular: No hyperdense  vessels.  Carotid vascular calcification Skull: Normal. Negative for fracture or focal lesion. Sinuses/Orbits: No acute finding. Other: None IMPRESSION: 1. Mild chronic small vessel ischemic changes of the white matter. Vague hypodensity within the right white matter corresponding to recent infarct on MRI. No interval hemorrhage by CT. Electronically Signed   By: Luke Bun M.D.   On: 02/26/2024 19:33     Procedures   Medications Ordered in the ED  amLODipine (NORVASC) tablet 5 mg (5 mg Oral Given 02/26/24 2157)                                    Medical Decision Making 58 year old male with past medical history of CHF and recent CVA with left-sided weakness presenting to the emergency department today with worsening left-sided weakness.  The patient's labs and CT scan ordered from triage are largely unremarkable.  Patient blood pressure is elevated here.  Will give him a dose of amlodipine here.  I did call and discussed his case with neurology.  They will come evaluate the patient to determine need for further imaging or workup but suspect this is likely due to to progression of his known recent CVA.  CT of his head does not show any hemorrhagic conversion or other acute findings.  The patient was evaluated by neurology.  They did feel that the patient symptoms were evolution of his stroke and did not recommend further imaging.  Recommended outpatient follow-up.  The patient is discharged with return precautions.  He is given a dose of amlodipine here as well as a prescription for amlodipine to take at home if his blood pressure remains elevated.  Risk Prescription drug management.        Final diagnoses:  Cerebrovascular accident (CVA), unspecified mechanism [facility])    ED Discharge Orders          Ordered    amLODipine (NORVASC) 5 MG tablet  Daily        02/26/24 2310               Ula Prentice SAUNDERS, MD 02/26/24 2311

## 2024-02-26 NOTE — Consult Note (Signed)
 NEUROLOGY CONSULT NOTE   Date of service: February 27, 2024 Patient Name: Zachary Cuevas. MRN:  [id number] DOB:  July 10, 1965 Chief Complaint: Worsening RLE weakness Requesting Provider: No att. providers found  History of Present Illness  Zachary Cuevas. is a 58 y.o. male with hx of recent R BG & corona radiata infarcts (9/25), HTN, remote smoking history, CAD (s/p CABG, PFO closure 2018), who presents with worsening RLE weakness.  Patient recently diagnosed with a R BG & R corona radiata infarcts after presenting on 9/25 for LLE weakness. CTA showed mild to moderate atherosclerosis of the paraclinoid ICA's. Echo showed mildly reduced systolic function of both ventricles. He was DAPT'ed, with Plavix  loading on 9/25. He was discharged on 9/26. At that time, he was able to lift the LLE and walk with a mild limp. Since discharge, has had fluctuation in the strength of his leg, however on 9/29, he had to drag his leg. Therefore presented to the ED again.   CTH obtained which showed hypodensity in area of known recent infarct. Currently reports improvement in his left leg strength.   Does endorse recently increased anxiety. Father passed away a few months ago. He also does not have insurance and is unsure of how he will proceed with earning money as he works in [employer].   Reports he has been taking his ASA & Plavix . Has upcoming appointment on Monday with a PCP to discuss BP management. Recently bought a blood pressure cuff.    ROS  Comprehensive ROS performed and pertinent positives documented in HPI    Past History   Past Medical History:  Diagnosis Date   Allergy    hay fever   Hypercholesteremia    Hypertension     Past Surgical History:  Procedure Laterality Date   CORONARY ARTERY BYPASS GRAFT N/A 07/14/2016   Procedure: CORONARY ARTERY BYPASS GRAFTING (CABG) x 2 WITH BILATERAL IMA;  Surgeon: Elspeth JAYSON Millers, MD;  Location: [facility];  Service: Open Heart  Surgery;  Laterality: N/A;   CORONARY BALLOON ANGIOPLASTY N/A 07/13/2016   Procedure: Coronary Balloon Angioplasty;  Surgeon: Deatrice DELENA Cage, MD;  Location: [facility] INVASIVE CV LAB;  Service: Cardiovascular;  Laterality: N/A;   FRACTURE SURGERY  2005   boxer fracture  of hands   LEFT HEART CATH AND CORONARY ANGIOGRAPHY N/A 07/13/2016   Procedure: Left Heart Cath and Coronary Angiography;  Surgeon: Deatrice DELENA Cage, MD;  Location: [facility] INVASIVE CV LAB;  Service: Cardiovascular;  Laterality: N/A;   TEE WITHOUT CARDIOVERSION N/A 07/14/2016   Procedure: TRANSESOPHAGEAL ECHOCARDIOGRAM (TEE);  Surgeon: Elspeth JAYSON Millers, MD;  Location: [facility] OR;  Service: Open Heart Surgery;  Laterality: N/A;    Family History: Family History  Problem Relation Age of Onset   Miscarriages / Stillbirths Mother    CAD Mother 70   Heart disease Maternal Grandfather    Diabetes Paternal Grandfather    Heart disease Paternal Grandfather    Heart disease Father 40   Diabetes Brother     Social History  reports that he quit smoking about 12 years ago. His smoking use included cigarettes. He has never used smokeless tobacco. He reports current drug use. Frequency: 7.00 times per week. Drug: Marijuana. He reports that he does not drink alcohol.  Allergies  Allergen Reactions   Penicillins Other (See Comments)    Childhood allergy - does not remember exact reaction    Medications  No current facility-administered medications for this encounter.  Current Outpatient  Medications:    amLODipine (NORVASC) 5 MG tablet, Take 1 tablet (5 mg total) by mouth daily., Disp: 30 tablet, Rfl: 0   aspirin  EC 81 MG tablet, Take 1 tablet (81 mg total) by mouth daily. Swallow whole., Disp: 19 tablet, Rfl: 0   carvedilol  (COREG ) 6.25 MG tablet, Take 1 tablet (6.25 mg total) by mouth 2 (two) times daily., Disp: 180 tablet, Rfl: 3   clopidogrel  (PLAVIX ) 75 MG tablet, Take 1 tablet (75 mg total) by mouth daily., Disp: 30 tablet, Rfl: 0    losartan  (COZAAR ) 50 MG tablet, Take 1 tablet (50 mg total) by mouth daily., Disp: 90 tablet, Rfl: 1   rosuvastatin  (CRESTOR ) 40 MG tablet, Take 1 tablet (40 mg total) by mouth daily., Disp: 30 tablet, Rfl: 0  Vitals   Vitals:   02/26/24 2230 02/26/24 2245 02/26/24 2300 02/26/24 2315  BP: (!) 188/120 (!) 176/120 (!) 188/105 (!) 171/117  Pulse: 66 67 66 68  Resp:    16  Temp:      TempSrc:    Oral  SpO2: 99% 97% 98% 100%  Weight:      Height:        Body mass index is 21.11 kg/m.   Physical Exam   Constitutional: Appears well-developed and well-nourished.  Psych: Affect appropriate to situation.  Eyes: No scleral injection.  HENT: No OP obstruction.  Head: Normocephalic.  Cardiovascular: Normal rate and regular rhythm.  Respiratory: Effort normal, non-labored breathing.  GI: No distension.  Skin: WDI.   Neurologic Examination   Mental status: alert, oriented to person, place and time. Able to provide history. Speech: no dysarthria, word-finding difficulty, paraphasic errors. Cranial nerves: PERRL EOMI VF full Face sensation intact bilaterally. Face symmetric at rest and with activation. Hearing grossly intact. Palate elevation symmetric Tongue protrudes midline and has full range of motion. SCM's full strength bilaterally. Motor: Left handed Normal bulk and tone. No abnormal movements RUE: shoulder abduction 5/5, biceps 5/5, triceps 5/5, wrist flexion 5/5, wrist extension 5/5, hand grip 5/5, finger extension 5/5, finger flexion (ulnar and medial) 5/5 LUE: shoulder abduction 4+/5, biceps 5/5, triceps 5/5, wrist flexion 5/5, wrist extension 5/5, hand grip 5/5, finger extension 5/5, finger flexion (ulnar and medial) 5/5 RLE: hip flexion 5/5, knee flexion 5/5, knee extension 5/5, ankle dorsiflexion 5/5, plantar flexion 5/5 LLE: hip flexion 4+/5, knee flexion 5-/5, knee extension 5/5, ankle dorsiflexion 5/5, plantar flexion 5/5 Sensory: Grossly intact to light touch  throughout. Reflexes: DTR's 1+ throughout )required distraction with serial 7's) except 2+ in L patellar. Downgoing toes bilaterally. Coordination: FTN intact. HTS intact. Gait: Deferred   Labs/Imaging/Neurodiagnostic studies   CBC:  Recent Labs  Lab 02/23/24 0743 2024/02/23 0748 02/26/24 1911  WBC 9.1  --  10.4  NEUTROABS 5.5  --  6.0  HGB 14.9 15.0 15.6  HCT 45.6 44.0 46.9  MCV 83.2  --  80.7  PLT 234  --  254   Basic Metabolic Panel:  Lab Results  Component Value Date   NA 140 02/26/2024   K 4.3 02/26/2024   CO2 27 02/26/2024   GLUCOSE 105 (H) 02/26/2024   BUN 17 02/26/2024   CREATININE 0.96 02/26/2024   CALCIUM  9.5 02/26/2024   GFRNONAA >60 02/26/2024   GFRAA 97 06/05/2017   Lipid Panel:  Lab Results  Component Value Date   LDLCALC 81 Feb 23, 2024   HgbA1c:  Lab Results  Component Value Date   HGBA1C 5.8 (H) 02-23-2024   Urine Drug Screen: No  results found for: LABOPIA, COCAINSCRNUR, LABBENZ, AMPHETMU, THCU, LABBARB  Alcohol Level     Component Value Date/Time   [facility] <15 02/22/2024 0743   INR  Lab Results  Component Value Date   INR 1.0 02/22/2024   APTT  Lab Results  Component Value Date   APTT 38 (H) 02/22/2024   AED levels: No results found for: PHENYTOIN, ZONISAMIDE, LAMOTRIGINE, LEVETIRACETA  CT Head without contrast(Personally reviewed): Hypodensity of R corona radiata.   CT angio Head and Neck with contrast (02/22/24) (Personally reviewed): Bilateral mild to moderate atherosclerosis of carotids, paraclinoid.  MRI Brain (02/22/24) (Personally reviewed): DWI and ADC correlate of R corona radiata and R BG.  Punctate white matter FLAIR changes consistent with small vessel disease.   ASSESSMENT   Manuelito Poage. is a 58 y.o. male with PMH of recent R BG & corona radiata infarcts (9/25), HTN, remote smoking history, CAD (s/p CABG, PFO closure 2018), who presents with worsening RLE weakness. Fluctuation in the  acute period after a stroke in the same limb consistent with evolution of stroke. Discussed MRI findings as well as neck vessel imaging from prior admission and this presentation's CT head. Patient reported that he has been very stressed due to job instability (works in [employer]), lack of insurance, as well as recent passing of his father a few months ago. However, after review of the above, reports feeling better mentally and also believes the strength in his left leg is now back to what it was at prior discharge.   RECOMMENDATIONS  - Continue DAPT (Patient advised to take Plavix  for a total of 21 days only, with last dose being 10/15) - Patient advised to continue ASA 81mg  & statin lifelong unless contraindication arises - Patient to keep appointment with PCP on Monday to discuss BP management  ______________________________________________________________________    Signed, Normie CHRISTELLA Blower, MD [facility]

## 2024-02-26 NOTE — ED Provider Triage Note (Addendum)
 Emergency Medicine Provider Triage Evaluation Note  Rilen Shukla. , a 58 y.o. male  was evaluated in triage.  Pt complains of progressive left lower extremity weakness after suffering a right sided corona radiata stroke on 9/25.  States that he cannot pick up his leg.  He was walking with a walker.  No upper extremity weakness.  Gradually worsened.  He had a headache yesterday, no headache today.  On DOAC.  Review of Systems  Positive: Leg weakness Negative: Speech difficulty  Physical Exam  BP (!) 182/119   Pulse 79   Temp 98.1 F (36.7 C)   Resp 18   Ht 6' 1 (1.854 m)   Wt 72.6 kg   SpO2 100%   BMI 21.11 kg/m  Gen:   Awake, no distress   Resp:  Normal effort  MSK:   Moves extremities without difficulty  Other:    Medical Decision Making  Medically screening exam initiated at 7:00 PM.  Appropriate orders placed.  Toribio Sherwood Renne Mickey. was informed that the remainder of the evaluation will be completed by another provider, this initial triage assessment does not replace that evaluation, and the importance of remaining in the ED until their evaluation is complete.  No code stroke due to no abrupt worsening or new symptoms --however, symptoms are worsened from previous CVA and patient will need CT head.  7:42 PM No interval hemorrhage on CT.    Desiderio Chew, PA-C 02/26/24 1902    Desiderio Chew, PA-C 02/26/24 1942

## 2024-02-26 NOTE — Transitions of Care (Post Inpatient/ED Visit) (Signed)
   02/26/2024  Name: Zachary Cuevas. MRN: [id number] DOB: 08-04-1965  Today's TOC FU Call Status: Today's TOC FU Call Status:: Successful TOC FU Call Completed TOC FU Call Complete Date: 02/26/24 Patient's Name and Date of Birth confirmed.  Transition Care Management Follow-up Telephone Call Date of Discharge: 02/23/24 Discharge Facility: [facility]) Type of Discharge: Inpatient Admission Primary Inpatient Discharge Diagnosis:: cerebral infarction How have you been since you were released from the hospital?: Better Any questions or concerns?: No  Items Reviewed: Did you receive and understand the discharge instructions provided?: Yes Medications obtained,verified, and reconciled?: Yes (Medications Reviewed) Any new allergies since your discharge?: No Dietary orders reviewed?: Yes Do you have support at home?: Yes People in Home [RPT]: spouse  Medications Reviewed Today: Medications Reviewed Today     Reviewed by Emmitt Pan, LPN (Licensed Practical Nurse) on 02/26/24 at 1415  Med List Status: <None>   Medication Order Taking? Sig Documenting Provider Last Dose Status Informant  aspirin  EC 81 MG tablet [id number]  Take 1 tablet (81 mg total) by mouth daily. Swallow whole. Marylu Gee, DO  Active   carvedilol  (COREG ) 6.25 MG tablet [id number]  Take 1 tablet (6.25 mg total) by mouth 2 (two) times daily. Lelon Nirali Magouirk T, PA-C  Active Self, Spouse/Significant Other, Pharmacy Records  clopidogrel  (PLAVIX ) 75 MG tablet [id number]  Take 1 tablet (75 mg total) by mouth daily. Marylu Gee, DO  Active   losartan  (COZAAR ) 50 MG tablet [id number]  Take 1 tablet (50 mg total) by mouth daily. Lelon Linzey Ramser T, PA-C  Active Self, Spouse/Significant Other, Pharmacy Records  rosuvastatin  (CRESTOR ) 40 MG tablet [id number]  Take 1 tablet (40 mg total) by mouth daily. Napoleon Limes, MD  Active             Home Care and Equipment/Supplies: Were Home Health Services  Ordered?: NA Any new equipment or medical supplies ordered?: NA  Functional Questionnaire: Do you need assistance with bathing/showering or dressing?: No Do you need assistance with meal preparation?: No Do you need assistance with eating?: No Do you have difficulty maintaining continence: No Do you need assistance with getting out of bed/getting out of a chair/moving?: No Do you have difficulty managing or taking your medications?: No  Follow up appointments reviewed: PCP Follow-up appointment confirmed?: Yes Date of PCP follow-up appointment?: 03/04/24 Follow-up Provider: [facility] Follow-up appointment confirmed?: NA Do you need transportation to your follow-up appointment?: No Do you understand care options if your condition(s) worsen?: Yes-patient verbalized understanding    SIGNATURE Pan Emmitt, LPN [facility] Nurse Health Advisor Direct Dial [phone]

## 2024-02-26 NOTE — ED Triage Notes (Signed)
 C/O left lower leg pain/swelling that started today. Denies headaches/blurred vision.  Axox4. Pt states he can make a fist with foot.

## 2024-02-26 NOTE — Discharge Instructions (Signed)
 Please take the Plavix .  Your last day should be on October 15.  Our neurologist did think that your symptoms are evolution of your stroke.  Please continue your blood pressure medications at home.  Take the amlodipine if your blood pressure is greater than 160/100 in addition to the medications that you are on at home.  Our neurologist did provide the link below for home rehabilitation exercises.  Please try these for 1 hour/day.  Return to the ER for worsening symptoms.  [email or web address]

## 2024-03-04 ENCOUNTER — Ambulatory Visit (INDEPENDENT_AMBULATORY_CARE_PROVIDER_SITE_OTHER): Payer: Self-pay

## 2024-03-04 ENCOUNTER — Other Ambulatory Visit: Payer: Self-pay

## 2024-03-04 VITALS — BP 125/91 | HR 90 | Temp 97.8°F | Ht 73.0 in | Wt 165.6 lb

## 2024-03-04 DIAGNOSIS — I5022 Chronic systolic (congestive) heart failure: Secondary | ICD-10-CM | POA: Insufficient documentation

## 2024-03-04 DIAGNOSIS — N529 Male erectile dysfunction, unspecified: Secondary | ICD-10-CM

## 2024-03-04 DIAGNOSIS — I2510 Atherosclerotic heart disease of native coronary artery without angina pectoris: Secondary | ICD-10-CM

## 2024-03-04 DIAGNOSIS — I10 Essential (primary) hypertension: Secondary | ICD-10-CM

## 2024-03-04 DIAGNOSIS — I6381 Other cerebral infarction due to occlusion or stenosis of small artery: Secondary | ICD-10-CM

## 2024-03-04 DIAGNOSIS — E782 Mixed hyperlipidemia: Secondary | ICD-10-CM

## 2024-03-04 MED ORDER — ROSUVASTATIN CALCIUM 40 MG PO TABS: 40.0000 mg | ORAL_TABLET | Freq: Every day | ORAL | 11 refills | Status: AC

## 2024-03-04 MED ORDER — CARVEDILOL 6.25 MG PO TABS
6.2500 mg | ORAL_TABLET | Freq: Two times a day (BID) | ORAL | 11 refills | Status: AC
Start: 2024-03-04 — End: ?

## 2024-03-04 MED ORDER — CLOPIDOGREL BISULFATE 75 MG PO TABS
75.0000 mg | ORAL_TABLET | Freq: Every day | ORAL | 11 refills | Status: AC
Start: 2024-03-04 — End: ?

## 2024-03-04 MED ORDER — AMLODIPINE BESYLATE 5 MG PO TABS: 5.0000 mg | ORAL_TABLET | Freq: Every day | ORAL | 11 refills | Status: AC

## 2024-03-04 MED ORDER — SPIRONOLACTONE 25 MG PO TABS: 25.0000 mg | ORAL_TABLET | Freq: Every day | ORAL | 11 refills | Status: AC

## 2024-03-04 MED ORDER — LOSARTAN POTASSIUM 50 MG PO TABS: 50.0000 mg | ORAL_TABLET | Freq: Every day | ORAL | 11 refills | Status: AC

## 2024-03-04 NOTE — Assessment & Plan Note (Signed)
 Patient is a longstanding history of hypertension.  He did bring in a home blood pressure log which showed consistent values between 150 and 160.  His blood pressure cuff was compared to the offices today and was approximately 15 points higher systolic.  However he still remains hypertensive.  He does admit to slight lower extremity swelling due to dependent edema secondary to stroke.  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    New current regimen includes: Losartan  50 mg daily, carvedilol  6.25 mg twice daily, amlodipine 5 mg daily, and spironolactone 25 mg daily. Orders:   spironolactone (ALDACTONE) 25 MG tablet; Take 1 tablet (25 mg total) by mouth daily.   amLODipine (NORVASC) 5 MG tablet; Take 1 tablet (5 mg total) by mouth daily.   carvedilol  (COREG ) 6.25 MG tablet; Take 1 tablet (6.25 mg total) by mouth 2 (two) times daily.   losartan  (COZAAR ) 50 MG tablet; Take 1 tablet (50 mg total) by mouth daily.

## 2024-03-04 NOTE — Assessment & Plan Note (Signed)
 The patient's last cholesterol panel was on 9/25 and showed LDL of 81 with total cholesterol of 144.  This is above goal of 70.  Continue Crestor  40 mg daily and recheck next visit. Orders:   losartan  (COZAAR ) 50 MG tablet; Take 1 tablet (50 mg total) by mouth daily.   rosuvastatin  (CRESTOR ) 40 MG tablet; Take 1 tablet (40 mg total) by mouth daily.

## 2024-03-04 NOTE — Assessment & Plan Note (Signed)
 The patient does have a well-documented history of coronary artery disease and is status post CABG and PFO closure in 2018.   He would benefit from seeing cardiology on outpatient basis for management of CAD and CHF however patient is currently uninsured.  He was given assistance paperwork which he has plans to complete by next visit.  If the patient becomes covered by insurance, would recommend cardiology referral.  Continue aspirin  and Plavix  through 10/15 and then transition to Plavix  indefinitely.  Continue Crestor  with goal LDL less than 70. Orders:   clopidogrel  (PLAVIX ) 75 MG tablet; Take 1 tablet (75 mg total) by mouth daily.   losartan  (COZAAR ) 50 MG tablet; Take 1 tablet (50 mg total) by mouth daily.

## 2024-03-04 NOTE — Patient Instructions (Addendum)
 Thank you, [name], for allowing us  to provide your care today. Today we discussed . . .  > High Blood Pressure       - Your blood pressure looked great today in the office!        - Your home machine is measuring a little high, you can subtract 10 from the top number when checking at home       - You do not need to check twice per day any longer, but should check it a couple of times per week. Make sure you are relaxed and your feet are flat on the floor when checking       - I have prescribed Spironolactone 25mg --take 1 tablet each morning > Stroke       - I'm glad your symptoms are improving!        - Keep doing your physical therapy       - Reduce your risk for stroke by continuing to not smoke, drink, or use illicit drugs.    Follow up: 6 weeks    Remember:  Should you have any questions or concerns please call the internal medicine clinic at [phone].     [name], DO [facility]

## 2024-03-04 NOTE — Progress Notes (Signed)
 Internal Medicine Clinic Attending  I was physically present during the key portions of the resident provided service and participated in the medical decision making of patient's management care. I reviewed pertinent patient test results.  The assessment, diagnosis, and plan were formulated together and I agree with the documentation in the resident's note.  Shawn Sick, MD

## 2024-03-04 NOTE — Progress Notes (Signed)
 Established Patient Office Visit  Subjective   Patient ID: Zachary Scipio., male    DOB: 1965/06/28  Age: 58 y.o. MRN: [id number]  Chief Complaint  Patient presents with   Hospitalization Follow-up    Patient is new to get established for care      [name] is a 58 year old male with past medical history of NSTEMI, CAD, status post CABG 2018, hyperlipidemia, hypertension, and right basal ganglia infarct who presents for hospital follow-up.  See obvious assessment and plan below.   Review of Systems  Constitutional:  Negative for chills and fever.  Eyes:  Negative for blurred vision, double vision and photophobia.  Respiratory:  Negative for shortness of breath.   Cardiovascular:  Negative for chest pain and palpitations.  Gastrointestinal:  Negative for nausea and vomiting.  Neurological:  Negative for dizziness, tingling, tremors, sensory change, speech change, focal weakness, seizures, loss of consciousness and headaches.      Objective:     BP (!) 125/91 (BP Location: Left Arm, Patient Position: Sitting, Cuff Size: Normal)   Pulse 90   Temp 97.8 F (36.6 C) (Oral)   Ht 6' 1 (1.854 m)   Wt 165 lb 9.6 oz (75.1 kg)   SpO2 94%   BMI 21.85 kg/m    Const: Awake, alert in NAD HENT: Normocephalic, atraumatic, mucus membranes moist Card: RRR, No MRG, No pitting edema on LE's bilaterally  Resp: LCTAB, no increased work of breathing Abd: Soft, NTND Extremities: Warm, pink Neuro: Muscle strength 5 out of 5 in all major muscle groups on the right upper and lower extremity.  Muscle strength 4/5 on left dorsiflexion all other major muscle groups 5/5 upper and lower extremity.    No results found for any visits on 03/04/24.    The ASCVD Risk score (Arnett DK, et al., 2019) failed to calculate for the following reasons:   Risk score cannot be calculated because patient has a medical history suggesting prior/existing ASCVD    Assessment & Plan:    Assessment & Plan Chronic heart failure with mildly reduced ejection fraction (HFmrEF) (HCC) The patient does have a substantial history of coronary artery disease and underwent echo during his evaluation in September.  His echo did show an EF of 50% with left ventricular decreased function and global hypokinesis.  Right ventricular systolic function was mildly reduced.  The patient remains well compensated without symptoms.  He denies chest pain, orthopnea, dyspnea on exertion, or bilateral lower extremity swelling.  Will continue beta-blocker and add spironolactone for GDMT and blood pressure control. Orders:   spironolactone (ALDACTONE) 25 MG tablet; Take 1 tablet (25 mg total) by mouth daily.   carvedilol  (COREG ) 6.25 MG tablet; Take 1 tablet (6.25 mg total) by mouth 2 (two) times daily.  Coronary artery disease involving native coronary artery of native heart without angina pectoris The patient does have a well-documented history of coronary artery disease and is status post CABG and PFO closure in 2018.   He would benefit from seeing cardiology on outpatient basis for management of CAD and CHF however patient is currently uninsured.  He was given assistance paperwork which he has plans to complete by next visit.  If the patient becomes covered by insurance, would recommend cardiology referral.  Continue aspirin  and Plavix  through 10/15 and then transition to Plavix  indefinitely.  Continue Crestor  with goal LDL less than 70. Orders:   clopidogrel  (PLAVIX ) 75 MG tablet; Take 1 tablet (75 mg total) by mouth daily.  losartan  (COZAAR ) 50 MG tablet; Take 1 tablet (50 mg total) by mouth daily.  Mixed hyperlipidemia The patient's last cholesterol panel was on 9/25 and showed LDL of 81 with total cholesterol of 144.  This is above goal of 70.  Continue Crestor  40 mg daily and recheck next visit. Orders:   losartan  (COZAAR ) 50 MG tablet; Take 1 tablet (50 mg total) by mouth daily.   rosuvastatin   (CRESTOR ) 40 MG tablet; Take 1 tablet (40 mg total) by mouth daily.  Primary hypertension Patient is a longstanding history of hypertension.  He did bring in a home blood pressure log which showed consistent values between 150 and 160.  His blood pressure cuff was compared to the offices today and was approximately 15 points higher systolic.  However he still remains hypertensive.  He does admit to slight lower extremity swelling due to dependent edema secondary to stroke.  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    New current regimen includes: Losartan  50 mg daily, carvedilol  6.25 mg twice daily, amlodipine 5 mg daily, and spironolactone 25 mg daily. Orders:   spironolactone (ALDACTONE) 25 MG tablet; Take 1 tablet (25 mg total) by mouth daily.   amLODipine (NORVASC) 5 MG tablet; Take 1 tablet (5 mg total) by mouth daily.   carvedilol  (COREG ) 6.25 MG tablet; Take 1 tablet (6.25 mg total) by mouth 2 (two) times daily.   losartan  (COZAAR ) 50 MG tablet; Take 1 tablet (50 mg total) by mouth daily.  Lacunar infarction [facility]) Patient was hospitalized on 02/22/24 for acute right basal ganglia and lacunar infarct from small vessel disease.  He is still experiencing left-sided weakness in the lower extremity however reports that this has improved.  He did have a ER visit on 9/29 reporting that his weakness did transiently worsen however this was deemed to be the evolution of the stroke.  Since discharge from the ED he has noticed improvement and states that he is doing well with his home physical therapy.  He is doing well with stroke risk modification including smoking cessation of all forms and increased exercise.  Continue aspirin  and Plavix  through 10/15 and then transition to Plavix  indefinitely.    Erectile dysfunction, unspecified erectile dysfunction type The patient did complain of  difficulty sustaining an erection today.  This is likely secondary to antihypertensives.  He reported that he is not interested in starting medication for this however if this becomes a bothersome problem for him he would discuss at next visit.      [name], DO

## 2024-03-04 NOTE — Assessment & Plan Note (Signed)
 The patient does have a substantial history of coronary artery disease and underwent echo during his evaluation in September.  His echo did show an EF of 50% with left ventricular decreased function and global hypokinesis.  Right ventricular systolic function was mildly reduced.  The patient remains well compensated without symptoms.  He denies chest pain, orthopnea, dyspnea on exertion, or bilateral lower extremity swelling.  Will continue beta-blocker and add spironolactone for GDMT and blood pressure control. Orders:   spironolactone (ALDACTONE) 25 MG tablet; Take 1 tablet (25 mg total) by mouth daily.   carvedilol  (COREG ) 6.25 MG tablet; Take 1 tablet (6.25 mg total) by mouth 2 (two) times daily.

## 2024-04-03 ENCOUNTER — Ambulatory Visit: Payer: Self-pay | Admitting: Physician Assistant

## 2024-04-16 ENCOUNTER — Encounter: Payer: Self-pay | Admitting: Student

## 2024-04-16 ENCOUNTER — Ambulatory Visit (INDEPENDENT_AMBULATORY_CARE_PROVIDER_SITE_OTHER): Payer: Self-pay | Admitting: Student

## 2024-04-16 ENCOUNTER — Other Ambulatory Visit: Payer: Self-pay

## 2024-04-16 VITALS — BP 112/79 | HR 71 | Temp 97.6°F | Ht 73.0 in | Wt 177.4 lb

## 2024-04-16 DIAGNOSIS — Z139 Encounter for screening, unspecified: Secondary | ICD-10-CM | POA: Insufficient documentation

## 2024-04-16 DIAGNOSIS — I2510 Atherosclerotic heart disease of native coronary artery without angina pectoris: Secondary | ICD-10-CM

## 2024-04-16 DIAGNOSIS — G8194 Hemiplegia, unspecified affecting left nondominant side: Secondary | ICD-10-CM

## 2024-04-16 DIAGNOSIS — R040 Epistaxis: Secondary | ICD-10-CM

## 2024-04-16 DIAGNOSIS — R195 Other fecal abnormalities: Secondary | ICD-10-CM | POA: Insufficient documentation

## 2024-04-16 DIAGNOSIS — Z5971 Insufficient health insurance coverage: Secondary | ICD-10-CM

## 2024-04-16 DIAGNOSIS — I10 Essential (primary) hypertension: Secondary | ICD-10-CM

## 2024-04-16 DIAGNOSIS — Z951 Presence of aortocoronary bypass graft: Secondary | ICD-10-CM

## 2024-04-16 DIAGNOSIS — I69354 Hemiplegia and hemiparesis following cerebral infarction affecting left non-dominant side: Secondary | ICD-10-CM

## 2024-04-16 NOTE — Assessment & Plan Note (Signed)
 SABRA

## 2024-04-16 NOTE — Progress Notes (Unsigned)
 Patient name: Zachary Cuevas. Date of birth: [birth year] Date of visit: 04/17/24  Subjective:  Reason for visit: Follow-up (6 wk ), Hypertension, and Epistaxis ( Pt reports having nose bleeds  for the past few weeks and he is  not sure if it has to  do  with his  new meds  ( blood thinner ) )  Doing well overall. Maintaining healthier diet since his stroke a couple of months ago. No problems with medication adherence. He's under some financial strain without any health insurance.   Current Outpatient Medications  Medication Instructions   amLODipine (NORVASC) 5 mg, Oral, Daily   aspirin  EC 81 mg, Oral, Daily, Swallow whole.   carvedilol  (COREG ) 6.25 mg, Oral, 2 times daily   clopidogrel  (PLAVIX ) 75 mg, Oral, Daily   losartan  (COZAAR ) 50 mg, Oral, Daily   rosuvastatin  (CRESTOR ) 40 mg, Oral, Daily   spironolactone (ALDACTONE) 25 mg, Oral, Daily    Objective: Today's Vitals   04/16/24 1432  BP: 112/79  Pulse: 71  Temp: 97.6 F (36.4 C)  TempSrc: Oral  SpO2: 95%  Weight: 177 lb 6.4 oz (80.5 kg)  Height: 6' 1 (1.854 m)  Body mass index is 23.41 kg/m.   Physical Exam Constitutional:      Appearance: Normal appearance.  HENT:     Nose:     Comments: No lesions visible by otoscope in either nare Cardiovascular:     Rate and Rhythm: Normal rate and regular rhythm.     Pulses: Normal pulses.     Heart sounds: No murmur heard. Pulmonary:     Effort: Pulmonary effort is normal. No respiratory distress.     Breath sounds: No wheezing.  Abdominal:     General: Abdomen is flat. There is no distension.     Palpations: Abdomen is soft. There is no mass.     Tenderness: There is no abdominal tenderness.  Skin:    General: Skin is warm and dry.  Neurological:     Mental Status: He is alert.     Cranial Nerves: No facial asymmetry.     Comments: Barely perceptible weakness of left leg compared to right leg with plantar/dorsiflexion of feet, knee flexion/extension   Psychiatric:        Mood and Affect: Affect normal.        Speech: Speech normal.        Behavior: Behavior normal.     Assessment and plan:  Assessment & Plan Encounter for screening involving social determinants of health [facility]) Doesn't qualify for SSI since he plans to return to work in a few months. No health insurance at this moment. Referral for financial resources placed Orders:   AMB Referral [facility] Care Management  Loose stools 1-2 bowel movements per day, no associated abdominal pain. No blood in the stool, no weight loss. Suspect this is from the significant dietary changes he has implemented, expect it to be self-limited.    Nosebleed Intermittent, non-severe bleeding from right nostril. Nothing abnormal on examination of nares. Things he has a sore spot inside but it's not bothering him much. Probably from recent drier air and minor adverse effect of his antiplatelets. Expect this to be self-limited.    Primary hypertension BP Readings from Last 3 Encounters:  04/16/24 112/79  03/04/24 (!) 125/91  02/26/24 (!) 171/117   Chronic, improved control of late. Continue losartan  50 mg daily, carvedilol  6.25 mg bid, amlodipine 5 mg daily, and spironolactone 25 mg daily.  Coronary artery disease involving native coronary artery of native heart without angina pectoris Chronic, stable. ASCVD includes 2018 NSTEMI with bypass surgery and recent lacunar type infarct in September 2025. BP goal 12/80. LDL goal < 55. Continue crestor  40 mg daily, plavix  75 mg daily. Discontinue aspirin .  Medications Discontinued During This Encounter  Medication Reason   aspirin  EC 81 MG tablet        Left hemiplegia (HCC) Chronic, as late effect of stroke. Improving. Doing well with PT/OT. His left leg weakness is barely perceptible on exam. His handwriting has returned to normal. He's using his cane less. Looking forward to getting back to work in a few months.     Return to discuss  erectile dysfunction.  Ozell Kung MD 04/17/2024, 5:59 AM

## 2024-04-16 NOTE — Assessment & Plan Note (Addendum)
 SABRA

## 2024-04-16 NOTE — Patient Instructions (Signed)
 Remember to bring all of the medications that you take (including over the counter medications and supplements) with you to every clinic visit.  This after visit summary is an important review of tests, referrals, and medication changes that were discussed during your visit. If you have questions or concerns, call [phone]. Outside of clinic business hours, call the main hospital at [phone] and ask the operator for the on-call internal medicine resident.   Ozell Kung MD 04/16/2024, 3:32 PM

## 2024-04-16 NOTE — Assessment & Plan Note (Addendum)
  Orders:   AMB Referral [facility] Care Management

## 2024-04-23 ENCOUNTER — Other Ambulatory Visit (HOSPITAL_COMMUNITY): Payer: Self-pay

## 2024-04-26 ENCOUNTER — Telehealth: Payer: Self-pay

## 2024-04-26 NOTE — Progress Notes (Signed)
 Complex Care Management Note  Care Guide Note 04/26/2024 Name: Elison Worrel. MRN: [id number] DOB: 1965-07-16  Toribio Sherwood Edrick Whitehorn. is a 58 y.o. year old male who sees Myrna Bitters, [place] for primary care. I reached out to Toribio Sherwood Renne Mickey. by phone today to offer complex care management services.  Mr. Coba was given information about Complex Care Management services today including:   The Complex Care Management services include support from the care team which includes your Nurse Care Manager, Clinical Social Worker, or Pharmacist.  The Complex Care Management team is here to help remove barriers to the health concerns and goals most important to you. Complex Care Management services are voluntary, and the patient may decline or stop services at any time by request to their care team member.   Complex Care Management Consent Status: Patient agreed to services and verbal consent obtained.   Follow up plan:  Telephone appointment with complex care management team member scheduled for:  05/06/24 and 05/17/24  Encounter Outcome:  Patient Scheduled  Leotis Rase [facility], [facility] Guide  Direct Dial: [phone]  Fax [phone]

## 2024-04-30 ENCOUNTER — Encounter: Payer: Self-pay | Admitting: Student

## 2024-04-30 ENCOUNTER — Ambulatory Visit: Payer: Self-pay | Admitting: Student

## 2024-04-30 VITALS — BP 126/85 | HR 71 | Temp 98.1°F | Ht 73.0 in | Wt 179.4 lb

## 2024-04-30 DIAGNOSIS — N5201 Erectile dysfunction due to arterial insufficiency: Secondary | ICD-10-CM | POA: Insufficient documentation

## 2024-04-30 DIAGNOSIS — Z8673 Personal history of transient ischemic attack (TIA), and cerebral infarction without residual deficits: Secondary | ICD-10-CM

## 2024-04-30 DIAGNOSIS — Z87891 Personal history of nicotine dependence: Secondary | ICD-10-CM

## 2024-04-30 DIAGNOSIS — I252 Old myocardial infarction: Secondary | ICD-10-CM

## 2024-04-30 DIAGNOSIS — Z8249 Family history of ischemic heart disease and other diseases of the circulatory system: Secondary | ICD-10-CM

## 2024-04-30 MED ORDER — SILDENAFIL CITRATE 50 MG PO TABS: 50.0000 mg | ORAL_TABLET | Freq: Every day | ORAL | 3 refills | Status: AC | PRN

## 2024-04-30 NOTE — Progress Notes (Signed)
  Patient name: Zachary Cuevas. Date of birth: [birth year] Date of visit: 04/30/24  Subjective  Reason for visit: problems maintaining erection  Discussed the use of AI scribe software for clinical note transcription with the patient, who gave verbal consent to proceed.  History of Present Illness   Zachary Cuevas. is a 58 year old male with a history of stroke and atherosclerotic vascular disease who presents with erectile dysfunction.  Erectile dysfunction - Erectile dysfunction for approximately one year - Difficulty maintaining erection strength during sexual activity - No changes in testicular size or sensation - No pain during sexual activity - No use of over-the-counter medications or supplements for erectile dysfunction  Cerebrovascular and cardiovascular disease - History of stroke and myocardial infarction - Hospitalized for stroke in September - Takes Plavix  daily for stroke prevention - Aspirin  discontinued - No episodes of epistaxis since last visit  Blood pressure - Blood pressure consistently low, ranging from 120/70 to 130/80 mmHg       Current Outpatient Medications  Medication Instructions   amLODipine  (NORVASC ) 5 mg, Oral, Daily   carvedilol  (COREG ) 6.25 mg, Oral, 2 times daily   clopidogrel  (PLAVIX ) 75 mg, Oral, Daily   losartan  (COZAAR ) 50 mg, Oral, Daily   rosuvastatin  (CRESTOR ) 40 mg, Oral, Daily   sildenafil (VIAGRA) 50 mg, Oral, Daily PRN, Take an hour prior to sex   spironolactone  (ALDACTONE ) 25 mg, Oral, Daily     Objective  Today's Vitals   04/30/24 0832  BP: 126/85  Pulse: 71  Temp: 98.1 F (36.7 C)  TempSrc: Oral  SpO2: 98%  Weight: 179 lb 6.4 oz (81.4 kg)  Height: 6' 1 (1.854 m)  PainSc: 0-No pain  Body mass index is 23.67 kg/m.   Physical Exam Constitutional:      Appearance: Normal appearance.  Pulmonary:     Effort: Pulmonary effort is normal. No respiratory distress.  Genitourinary:    Comments:  Normal-appearing penis and testicles Skin:    General: Skin is warm and dry.  Neurological:     Mental Status: He is alert.     Cranial Nerves: No facial asymmetry.  Psychiatric:        Mood and Affect: Affect normal.        Speech: Speech normal.        Behavior: Behavior normal.          Assessment & Plan Erectile dysfunction due to arterial insufficiency Chronic for at least 1 year. With comorbid ASCVD. Outpatient medications not likely contributing. Good libido, no pain with intercourse, but can't maintain strong erection for duration of sexual encounter. Normal testicular exam. He's risk optimized with respect to his ASCVD after his lacunar-type cerebral infarction in September 2025. Start trial of sildenafil 50 mg as needed, can take up to a maximum of 100 mg per dose. Counseled regarding risk of low blood pressure and priapism.  Orders:   sildenafil (VIAGRA) 50 MG tablet; Take 1 tablet (50 mg total) by mouth daily as needed for erectile dysfunction. Take an hour prior to sex  Return in about 3 months (around 07/29/2024).  Ozell Kung MD 04/30/2024, 9:12 AM

## 2024-04-30 NOTE — Patient Instructions (Addendum)
 VISIT SUMMARY: Today, we discussed your erectile dysfunction, which is likely due to your atherosclerotic vascular disease. We also reviewed your cardiovascular health and stroke recovery.  YOUR PLAN: ERECTILE DYSFUNCTION: You have been experiencing erectile dysfunction for about a year, likely due to arterial insufficiency from atherosclerotic vascular disease. -Take sildenafil 50 mg one hour before sexual activity. If it is not effective, you may take a second tablet, but do not exceed 100 mg per dose. -Be aware of potential side effects of sildenafil, such as headache, facial flushing, and the rare risk of a prolonged erection. -Stop using it if you get dizzy or light-headed after you take it. -Avoid taking nitrates while using sildenafil. -Your prescription for sildenafil has been sent to [facility] on [address].  ATHEROSCLEROTIC CARDIOVASCULAR DISEASE: Your erectile dysfunction is likely related to your atherosclerotic cardiovascular disease. Your blood pressure is well-controlled, and managing your cholesterol is important. -Continue with your current blood pressure management regimen. -Maintain your cholesterol levels to prevent the progression of atherosclerotic disease.  HISTORY OF STROKE: You are continuing to recover from your stroke with increased strength and mobility. -Continue taking Plavix  daily for stroke prevention.  Remember to bring all of the medications that you take (including over the counter medications and supplements) with you to every clinic visit.  This after visit summary is an important review of tests, referrals, and medication changes that were discussed during your visit. If you have questions or concerns, call [phone]. Outside of clinic business hours, call the main hospital at [phone] and ask the operator for the on-call internal medicine resident.   Ozell Kung MD 04/30/2024, 9:12 AM

## 2024-04-30 NOTE — Assessment & Plan Note (Addendum)
 Chronic for at least 1 year. With comorbid ASCVD. Outpatient medications not likely contributing. Good libido, no pain with intercourse, but can't maintain strong erection for duration of sexual encounter. Normal testicular exam. He's risk optimized with respect to his ASCVD after his lacunar-type cerebral infarction in September 2025. Start trial of sildenafil 50 mg as needed, can take up to a maximum of 100 mg per dose. Counseled regarding risk of low blood pressure and priapism.  Orders:   sildenafil (VIAGRA) 50 MG tablet; Take 1 tablet (50 mg total) by mouth daily as needed for erectile dysfunction. Take an hour prior to sex

## 2024-05-01 NOTE — Progress Notes (Signed)
 Internal Medicine Clinic Attending  Case discussed with the resident at the time of the visit.  We reviewed the resident's history and exam and pertinent patient test results.  I agree with the assessment, diagnosis, and plan of care documented in the resident's note.

## 2024-05-06 ENCOUNTER — Other Ambulatory Visit: Payer: Self-pay

## 2024-05-06 NOTE — Progress Notes (Signed)
 Internal Medicine Clinic Attending  Case discussed with the resident at the time of the visit.  We reviewed the resident's history and exam and pertinent patient test results.  I agree with the assessment, diagnosis, and plan of care documented in the resident's note.

## 2024-05-06 NOTE — Patient Instructions (Signed)
 Visit Information  Thank you for taking time to visit with me today. Please don't hesitate to contact me if I can be of assistance to you before our next scheduled appointment.  Our next appointment is by telephone on 05/20/2024 at 9AM Please call the care guide team at [phone] if you need to cancel or reschedule your appointment.   Following is a copy of your care plan:   Goals Addressed             This Visit's Progress    BSW Goal       Current SDOH Barriers:  none Financial strain  Interventions: Patient interviewed and appropriate screenings performed Referred patient to community resources  Provided patient with information about FNS and medicaid Discussed plans with patient for ongoing follow up and provided patient with direct contact number Advised patient to follow up with DSS about FNS application and apply for Medicaid BSW assisted patient in applying for FNS online. App ref # is [id number].  Pt will attempt to apply for medicaid in the next 2 weeks.           Please call the Suicide and Crisis Lifeline: 988 go to [facility] [address], [place] [phone]) call 911 if you are experiencing a Mental Health or Behavioral Health Crisis or need someone to talk to.  Patient verbalized understanding of Care plan and visit instructions communicated this visit  Laymon Doll, [place] [facility] - [facility] Social Worker [phone]

## 2024-05-06 NOTE — Patient Outreach (Signed)
 Social Drivers of Health  Community Resource and Care Coordination Visit Note   05/06/2024  Name: Zachary Cuevas. MRN: [id number] DOB:[birth year]  Situation: Referral received for [facility] needs assessment and assistance related to None. I obtained verbal consent from Patient.  Visit completed with Patient on the phone.   Background:   SDOH Interventions Today    Flowsheet Row Most Recent Value  SDOH Interventions   Food Insecurity Interventions Intervention Not Indicated  [pt states they are ok with food.]  Housing Interventions Intervention Not Indicated  [no issues housing right now.]  Transportation Interventions Intervention Not Indicated  [patient has his own transportation. No issues with transportation.]  Utilities Interventions Intervention Not Indicated  Financial Strain Interventions Intervention Not Indicated     Assessment:   Goals Addressed             This Visit's Progress    BSW Goal       Current SDOH Barriers:  none Financial strain  Interventions: Patient interviewed and appropriate screenings performed Referred patient to community resources  Provided patient with information about FNS and medicaid Discussed plans with patient for ongoing follow up and provided patient with direct contact number Advised patient to follow up with DSS about FNS application and apply for Medicaid BSW assisted patient in applying for FNS online. App ref # is [id number].  Pt will attempt to apply for medicaid in the next 2 weeks.           Recommendation:   attend all scheduled provider appointments complete SNAP application Apply for Medicaid  Follow Up Plan:   Telephone follow up appointment date/time:  05/20/2024 at 9AM  Laymon Doll, [place]/VBCI - [facility] Social Worker [phone]

## 2024-05-17 ENCOUNTER — Other Ambulatory Visit: Payer: Self-pay

## 2024-05-17 ENCOUNTER — Telehealth: Payer: Self-pay | Admitting: *Deleted

## 2024-05-17 NOTE — Patient Outreach (Signed)
 Complex Care Management   Visit Note  05/17/2024  Name:  Zachary Cuevas. MRN: [id number] DOB: 08-Oct-1965  Situation: Referral received for Complex Care Management related to SDOH Barriers:  Financial Resource Strain and HTN I obtained verbal consent from Patient.  Visit completed with Patient  on the phone  Background:   Past Medical History:  Diagnosis Date   Acute cerebral infarction (HCC) 02/22/2024   Allergy    hay fever   Hypercholesteremia    Hypertension    Non-STEMI (non-ST elevated myocardial infarction) (HCC) 07/13/2016    Assessment: Patient Reported Symptoms:  Cognitive Cognitive Status: Able to follow simple commands, Alert and oriented to person, place, and time, Insightful and able to interpret abstract concepts, Normal speech and language skills Cognitive/Intellectual Conditions Management [RPT]: None reported or documented in medical history or problem list   Health Maintenance Behaviors: Annual physical exam, Sleep adequate, Social activities, Healthy diet Healing Pattern: Average Health Facilitated by: Healthy diet, Prayer/meditation, Rest, Stress management  Neurological   Neurological Management Strategies: Adequate rest, Coping strategies Neurological Self-Management Outcome: 4 (good)  HEENT   HEENT Management Strategies: Adequate rest, Routine screening HEENT Self-Management Outcome: 4 (good)    Cardiovascular Cardiovascular Symptoms Reported: No symptoms reported Does patient have uncontrolled Hypertension?: Yes Is patient checking Blood Pressure at home?: Yes Patient's Recent BP reading at home: Patient reports that blood pressure ranges from the 120's-125's over 70's and 80's.  Patient reports that he takes his blood pressure daily. Cardiovascular Management Strategies: Adequate rest, Coping strategies, Routine screening, Medication therapy Cardiovascular Self-Management Outcome: 4 (good)  Respiratory Respiratory Symptoms Reported: No  symptoms reported Respiratory Management Strategies: Adequate rest Respiratory Self-Management Outcome: 4 (good)  Endocrine Endocrine Symptoms Reported: No symptoms reported Is patient diabetic?: No Endocrine Self-Management Outcome: 4 (good)  Gastrointestinal Gastrointestinal Symptoms Reported: Diarrhea Gastrointestinal Management Strategies: Adequate rest Gastrointestinal Self-Management Outcome: 4 (good)    Genitourinary Genitourinary Symptoms Reported: Frequency Additional Genitourinary Details: Increased frequency due to diuretic Genitourinary Management Strategies: Adequate rest, Medication therapy Genitourinary Self-Management Outcome: 4 (good)  Integumentary Integumentary Symptoms Reported: No symptoms reported Skin Management Strategies: Adequate rest, Routine screening Skin Self-Management Outcome: 4 (good)  Musculoskeletal Musculoskelatal Symptoms Reviewed: Weakness Additional Musculoskeletal Details: Patient with recent stroke.  Patient informs me that he continues to have left side weakness in his left leg.  He reports that he is able to get around his home without an assistive device but uses a 4 prone can when he goes out.He informed me that does his own Physical Therapy an hour a day and 5 days a week. He was given information for an app by provider. Musculoskeletal Management Strategies: Adequate rest, Coping strategies, Medical device, Routine screening, Medication therapy Musculoskeletal Self-Management Outcome: 3 (uncertain) Falls in the past year?: Yes (Reports that he has fallen about a month ago.  Reports that he lost his balance.) Number of falls in past year: 1 or less Was there an injury with Fall?:  (Patient reports that he has a few scratches and a bump on his head.  Reports that he fall about a month ago.) Fall Risk Category Calculator: 2 Patient Fall Risk Level: Moderate Fall Risk Patient at Risk for Falls Due to: Impaired balance/gait Fall risk Follow up:  Falls evaluation completed, Education provided, Falls prevention discussed  Psychosocial Psychosocial Symptoms Reported: Anxiety - if selected complete GAD Additional Psychological Details: Reports that he has anxiety about running out of money.  He reports that he has been spending his savings  and inheritance money while being out of work. He reports that he worries about spending money and not being able to place money back as he is not working at this time. Patient denies the need for LCSW at this time. Behavioral Management Strategies: Adequate rest, Support system Behavioral Health Self-Management Outcome: 3 (uncertain) Major Change/Loss/Stressor/Fears (CP): Medical condition, self Techniques to Cope with Loss/Stress/Change: Diversional activities, Support group Quality of Family Relationships: helpful, involved, supportive Do you feel physically threatened by others?: No    05/17/2024    PHQ2-9 Depression Screening   Little interest or pleasure in doing things Not at all  Feeling down, depressed, or hopeless Not at all  PHQ-2 - Total Score 0  Trouble falling or staying asleep, or sleeping too much    Feeling tired or having little energy    Poor appetite or overeating     Feeling bad about yourself - or that you are a failure or have let yourself or your family down    Trouble concentrating on things, such as reading the newspaper or watching television    Moving or speaking so slowly that other people could have noticed.  Or the opposite - being so fidgety or restless that you have been moving around a lot more than usual    Thoughts that you would be better off dead, or hurting yourself in some way    PHQ2-9 Total Score    If you checked off any problems, how difficult have these problems made it for you to do your work, take care of things at home, or get along with other people    Depression Interventions/Treatment      There were no vitals filed for this visit. Pain Scale:  0-10 Pain Score: 0-No pain  Medications Reviewed Today     Reviewed by Faust Thorington A, RN (Case Manager) on 05/17/24 at 1256  Med List Status: <None>   Medication Order Taking? Sig Documenting Provider Last Dose Status Informant  amLODipine  (NORVASC ) 5 MG tablet [id number] Yes Take 1 tablet (5 mg total) by mouth daily. Myrna Bitters, DO  Active   carvedilol  (COREG ) 6.25 MG tablet [id number] Yes Take 1 tablet (6.25 mg total) by mouth 2 (two) times daily. Myrna Bitters, DO  Active   clopidogrel  (PLAVIX ) 75 MG tablet [id number] Yes Take 1 tablet (75 mg total) by mouth daily. Myrna Bitters, DO  Active   losartan  (COZAAR ) 50 MG tablet [id number] Yes Take 1 tablet (50 mg total) by mouth daily. Myrna Bitters, DO  Active   rosuvastatin  (CRESTOR ) 40 MG tablet [id number] Yes Take 1 tablet (40 mg total) by mouth daily. Myrna Bitters, DO  Active   sildenafil  (VIAGRA ) 50 MG tablet [id number] Yes Take 1 tablet (50 mg total) by mouth daily as needed for erectile dysfunction. Take an hour prior to sex Norrine Sharper, MD  Active   spironolactone  (ALDACTONE ) 25 MG tablet [id number] Yes Take 1 tablet (25 mg total) by mouth daily. Myrna Bitters, DO  Active             Recommendation:   PCP Follow-up Continue Current Plan of Care  Follow Up Plan:   Telephone follow-up in 1 month: 07/01/24 @ 11 am.  Ilisa Hayworth, RN, BSN, ACM RN Care Manager [employer] [phone]

## 2024-05-17 NOTE — Patient Instructions (Signed)
 Visit Information  Thank you for taking time to visit with me today. Please don't hesitate to contact me if I can be of assistance to you before our next scheduled appointment.  Our next appointment is by telephone on 07/02/24 at 11 am. Please call the care guide team at [phone] if you need to cancel or reschedule your appointment.   Following is a copy of your care plan:   Goals Addressed             This Visit's Progress    VBCI RN Care Plan-HTN       Problems:  Care Coordination needs related to Financial Strain  Chronic Disease Management support and education needs related to HTN  Goal: Over the next 90 days the Patient will attend all scheduled medical appointments: PCP and Specialist as evidenced by keeping all schedule appointments.          continue to work with Medical Illustrator and/or Social Worker to address care management and care coordination needs related to HTN as evidenced by adherence to care management team scheduled appointments     take all medications exactly as prescribed and will call provider for medication related questions as evidenced by compliance.     verbalize basic understanding of HTN disease process and self health management plan as evidenced by verbal explanation, recognize, monitor symptoms and life style changes.   work with child psychotherapist to address Financial constraints related to Stroke and being out of work related to the management of HTN as evidenced by review of electronic medical record and patient or social worker report      Interventions:   Hypertension Interventions: Last practice recorded BP readings:  BP Readings from Last 3 Encounters:  04/30/24 126/85  04/16/24 112/79  03/04/24 (!) 125/91   Most recent eGFR/CrCl:  Lab Results  Component Value Date   EGFR 91 04/17/2023    No components found for: CRCL  Evaluation of current treatment plan related to hypertension self management and patient's adherence to plan as  established by provider Provided education to patient re: stroke prevention, s/s of heart attack and stroke Reviewed medications with patient and discussed importance of compliance Counseled on the importance of exercise goals with target of 150 minutes per week Discussed plans with patient for ongoing care management follow up and provided patient with direct contact information for care management team Advised patient, providing education and rationale, to monitor blood pressure daily and record, calling PCP for findings outside established parameters Provided education on prescribed diet Dash Diet Discussed complications of poorly controlled blood pressure such as heart disease, stroke, circulatory complications, vision complications, kidney impairment, sexual dysfunction Screening for signs and symptoms of depression related to chronic disease state  Assessed social determinant of health barriers  Patient Self-Care Activities:  Attend all scheduled provider appointments Attend church or other social activities Call pharmacy for medication refills 3-7 days in advance of running out of medications Call provider office for new concerns or questions  Perform all self care activities independently  Take medications as prescribed   Work with the social worker to address care coordination needs and will continue to work with the clinical team to address health care and disease management related needs check blood pressure 3 times per week write blood pressure results in a log or diary learn about high blood pressure keep a blood pressure log take blood pressure log to all doctor appointments call doctor for signs and symptoms of high blood  pressure keep all doctor appointments take medications for blood pressure exactly as prescribed report new symptoms to your doctor eat more whole grains, fruits and vegetables, lean meats and healthy fats limit salt intake to 1500 mg/day  Plan:   Telephone follow up appointment with care management team member scheduled for:  07/02/24 @ 11 am.             Please call the Suicide and Crisis Lifeline: 988 call the [place]  National Suicide Prevention Lifeline: 936-032-2800 or TTY: 716 523 5049 TTY (463)639-0678) to talk to a trained counselor call 1-800-273-TALK (toll free, 24 hour hotline) go to [facility] [address] [place] [phone]) call the [facility] Crisis Line: [phone] call 911 if you are experiencing a Mental Health or Behavioral Health Crisis or need someone to talk to.  Patient verbalized understanding of Care plan and visit instructions communicated this visit  Song Garris, RN, BSN, ACM RN Care Manager [employer] [phone]

## 2024-05-20 ENCOUNTER — Telehealth: Payer: Self-pay

## 2024-06-03 ENCOUNTER — Telehealth: Payer: Self-pay

## 2024-06-03 ENCOUNTER — Other Ambulatory Visit: Payer: Self-pay

## 2024-06-03 NOTE — Patient Outreach (Signed)
 Social Drivers of Health  Community Resource and Care Coordination Visit Note   06/03/2024  Name: Zachary Cuevas. MRN: [id number] DOB:August 29, 1965  Situation: Referral received for [facility] needs assessment and assistance related to None. I obtained verbal consent from Patient.  Visit completed with Patient on the phone.   Background:   SDOH Interventions Today    Flowsheet Row Most Recent Value  SDOH Interventions   Food Insecurity Interventions --  [applied for FNS and was approved.]  Housing Interventions Intervention Not Indicated  Transportation Interventions Intervention Not Indicated  Utilities Interventions Intervention Not Indicated     Assessment:   Goals Addressed             This Visit's Progress    COMPLETED: BSW Goal       Current SDOH Barriers:  none Financial strain  Interventions: Patient interviewed and appropriate screenings performed Referred patient to community resources  Provided patient with information about FNS and medicaid Discussed plans with patient for ongoing follow up and provided patient with direct contact number Advised patient to follow up with DSS about FNS application and apply for Medicaid BSW assisted patient in applying for FNS online. App ref # is [id number].  Pt will attempt to apply for medicaid in the next 2 weeks.  06/03/2024 Patient states he was approved for FNS and received an EBT card.  Patient did not apply for Medicaid and states he does not intend to. No other needs ID at this time. Pt ok with closing out case.         Recommendation:   attend all scheduled provider appointments  Follow Up Plan:   Patient has achieved all patient stated goals. [employer] will be closed. Patient has been provided contact information should new needs arise.   Zachary Cuevas, BSW [place]/VBCI - [employer] Social Worker [phone]

## 2024-06-03 NOTE — Patient Instructions (Signed)

## 2024-06-13 ENCOUNTER — Telehealth: Payer: Self-pay | Admitting: *Deleted

## 2024-06-13 NOTE — Progress Notes (Signed)
 Complex Care Management Care Guide Note  06/13/2024 Name: Zachary Cuevas. MRN: [id number] DOB: 11-Aug-1965  Zachary Cuevas. is a 59 y.o. year old male who is a primary care patient of Myrna Bitters, DO and is actively engaged with the care management team. I reached out to Zachary Cuevas. by phone today to assist with re-scheduling  with the RN Case Manager.  Follow up plan: Unsuccessful telephone outreach attempt made.  Harlene Satterfield  [facility]  [facility] [place] [facility] Guide  Direct Dial: [phone]  Fax [phone]

## 2024-06-17 NOTE — Progress Notes (Signed)
 Complex Care Management Care Guide Note  06/17/2024 Name: Zachary Cuevas. MRN: [id number] DOB: Oct 22, 1965  Zachary Sherwood Ramsey Guadamuz. is a 59 y.o. year old male who is a primary care patient of Myrna Bitters, DO and is actively engaged with the care management team. I reached out to Zachary Cuevas. by phone today to assist with re-scheduling  with the RN Case Manager.  Follow up plan: Telephone appointment with complex care management team member scheduled for:  2/11 at 300 PM   Harlene Satterfield  [facility], [facility] Guide  Direct Dial: [phone]  Fax [phone]

## 2024-07-02 ENCOUNTER — Telehealth: Payer: Self-pay | Admitting: *Deleted

## 2024-07-10 ENCOUNTER — Telehealth: Payer: Self-pay | Admitting: *Deleted

## 2024-07-29 ENCOUNTER — Ambulatory Visit: Payer: Self-pay | Admitting: Student
